# Patient Record
Sex: Female | Born: 1940 | Race: Black or African American | Hispanic: No | Marital: Married | Smoking: Former smoker
Health system: Southern US, Community
[De-identification: ages and names within clinical notes are randomized; demographics above are authoritative.]

## PROBLEM LIST (undated history)

## (undated) DIAGNOSIS — I10 Essential (primary) hypertension: Secondary | ICD-10-CM

## (undated) DIAGNOSIS — E119 Type 2 diabetes mellitus without complications: Secondary | ICD-10-CM

## (undated) HISTORY — PX: CHOLECYSTECTOMY: SHX55

## (undated) HISTORY — PX: ABDOMINAL HYSTERECTOMY: SHX81

---

## 2004-04-04 ENCOUNTER — Other Ambulatory Visit: Payer: Self-pay

## 2004-04-05 ENCOUNTER — Other Ambulatory Visit: Payer: Self-pay

## 2004-11-21 ENCOUNTER — Ambulatory Visit: Payer: Self-pay | Admitting: Internal Medicine

## 2005-11-24 ENCOUNTER — Ambulatory Visit: Payer: Self-pay | Admitting: Internal Medicine

## 2009-01-09 ENCOUNTER — Ambulatory Visit: Payer: Self-pay | Admitting: Family Medicine

## 2009-04-17 ENCOUNTER — Ambulatory Visit: Payer: Self-pay | Admitting: Gastroenterology

## 2009-04-18 ENCOUNTER — Ambulatory Visit: Payer: Self-pay | Admitting: Gastroenterology

## 2010-01-31 ENCOUNTER — Ambulatory Visit: Payer: Self-pay | Admitting: Family Medicine

## 2010-06-06 ENCOUNTER — Emergency Department: Payer: Self-pay | Admitting: Emergency Medicine

## 2013-02-26 ENCOUNTER — Ambulatory Visit: Payer: Self-pay | Admitting: Physical Medicine and Rehabilitation

## 2013-05-24 ENCOUNTER — Ambulatory Visit: Payer: Self-pay | Admitting: Nurse Practitioner

## 2017-05-28 ENCOUNTER — Other Ambulatory Visit: Payer: Self-pay | Admitting: Orthopedic Surgery

## 2017-05-28 DIAGNOSIS — M5136 Other intervertebral disc degeneration, lumbar region: Secondary | ICD-10-CM

## 2017-05-28 DIAGNOSIS — M51369 Other intervertebral disc degeneration, lumbar region without mention of lumbar back pain or lower extremity pain: Secondary | ICD-10-CM

## 2017-05-28 DIAGNOSIS — M79605 Pain in left leg: Principal | ICD-10-CM

## 2017-05-28 DIAGNOSIS — M79604 Pain in right leg: Secondary | ICD-10-CM

## 2017-05-28 DIAGNOSIS — M48062 Spinal stenosis, lumbar region with neurogenic claudication: Secondary | ICD-10-CM

## 2017-06-05 ENCOUNTER — Encounter: Payer: Self-pay | Admitting: Radiology

## 2017-06-05 ENCOUNTER — Ambulatory Visit
Admission: RE | Admit: 2017-06-05 | Discharge: 2017-06-05 | Disposition: A | Discharge status: Home / Self Care | Payer: Medicare Other | Source: Ambulatory Visit | Attending: Orthopedic Surgery | Admitting: Orthopedic Surgery

## 2017-06-05 DIAGNOSIS — M51369 Other intervertebral disc degeneration, lumbar region without mention of lumbar back pain or lower extremity pain: Secondary | ICD-10-CM

## 2017-06-05 DIAGNOSIS — M5127 Other intervertebral disc displacement, lumbosacral region: Secondary | ICD-10-CM | POA: Insufficient documentation

## 2017-06-05 DIAGNOSIS — M48062 Spinal stenosis, lumbar region with neurogenic claudication: Secondary | ICD-10-CM

## 2017-06-05 DIAGNOSIS — M79604 Pain in right leg: Secondary | ICD-10-CM | POA: Diagnosis not present

## 2017-06-05 DIAGNOSIS — M2548 Effusion, other site: Secondary | ICD-10-CM | POA: Diagnosis not present

## 2017-06-05 DIAGNOSIS — M1288 Other specific arthropathies, not elsewhere classified, other specified site: Secondary | ICD-10-CM | POA: Diagnosis not present

## 2017-06-05 DIAGNOSIS — M48061 Spinal stenosis, lumbar region without neurogenic claudication: Secondary | ICD-10-CM | POA: Diagnosis not present

## 2017-06-05 DIAGNOSIS — M79605 Pain in left leg: Secondary | ICD-10-CM | POA: Diagnosis not present

## 2017-06-05 DIAGNOSIS — M4807 Spinal stenosis, lumbosacral region: Secondary | ICD-10-CM | POA: Insufficient documentation

## 2017-06-05 DIAGNOSIS — M5136 Other intervertebral disc degeneration, lumbar region: Secondary | ICD-10-CM | POA: Diagnosis not present

## 2017-07-10 ENCOUNTER — Other Ambulatory Visit: Payer: Self-pay | Admitting: Neurosurgery

## 2017-07-10 DIAGNOSIS — R2689 Other abnormalities of gait and mobility: Secondary | ICD-10-CM

## 2017-07-18 ENCOUNTER — Ambulatory Visit: Payer: Medicare Other

## 2017-07-21 ENCOUNTER — Ambulatory Visit
Admission: RE | Admit: 2017-07-21 | Discharge: 2017-07-21 | Disposition: A | Discharge status: Home / Self Care | Payer: Medicare Other | Source: Ambulatory Visit | Attending: Neurosurgery | Admitting: Neurosurgery

## 2017-07-21 DIAGNOSIS — R2689 Other abnormalities of gait and mobility: Secondary | ICD-10-CM | POA: Diagnosis present

## 2017-07-21 DIAGNOSIS — M4802 Spinal stenosis, cervical region: Secondary | ICD-10-CM | POA: Diagnosis not present

## 2017-07-21 DIAGNOSIS — R292 Abnormal reflex: Secondary | ICD-10-CM | POA: Insufficient documentation

## 2019-02-03 ENCOUNTER — Encounter: Payer: Self-pay | Admitting: Medical Oncology

## 2019-02-03 ENCOUNTER — Inpatient Hospital Stay
Admission: EM | Admit: 2019-02-03 | Discharge: 2019-02-14 | DRG: 180 | Disposition: A | Discharge status: Skilled Nursing Facility | Payer: Medicare Other | Attending: Internal Medicine | Admitting: Internal Medicine

## 2019-02-03 ENCOUNTER — Emergency Department: Payer: Medicare Other

## 2019-02-03 DIAGNOSIS — Z79899 Other long term (current) drug therapy: Secondary | ICD-10-CM

## 2019-02-03 DIAGNOSIS — E860 Dehydration: Secondary | ICD-10-CM | POA: Diagnosis present

## 2019-02-03 DIAGNOSIS — C3490 Malignant neoplasm of unspecified part of unspecified bronchus or lung: Secondary | ICD-10-CM | POA: Diagnosis not present

## 2019-02-03 DIAGNOSIS — E871 Hypo-osmolality and hyponatremia: Secondary | ICD-10-CM | POA: Diagnosis not present

## 2019-02-03 DIAGNOSIS — E876 Hypokalemia: Secondary | ICD-10-CM | POA: Diagnosis not present

## 2019-02-03 DIAGNOSIS — M48061 Spinal stenosis, lumbar region without neurogenic claudication: Secondary | ICD-10-CM | POA: Diagnosis present

## 2019-02-03 DIAGNOSIS — Z515 Encounter for palliative care: Secondary | ICD-10-CM | POA: Diagnosis present

## 2019-02-03 DIAGNOSIS — E559 Vitamin D deficiency, unspecified: Secondary | ICD-10-CM | POA: Diagnosis present

## 2019-02-03 DIAGNOSIS — Z66 Do not resuscitate: Secondary | ICD-10-CM | POA: Diagnosis present

## 2019-02-03 DIAGNOSIS — M5000 Cervical disc disorder with myelopathy, unspecified cervical region: Secondary | ICD-10-CM | POA: Diagnosis present

## 2019-02-03 DIAGNOSIS — I10 Essential (primary) hypertension: Secondary | ICD-10-CM | POA: Diagnosis present

## 2019-02-03 DIAGNOSIS — R0902 Hypoxemia: Secondary | ICD-10-CM

## 2019-02-03 DIAGNOSIS — J9601 Acute respiratory failure with hypoxia: Secondary | ICD-10-CM | POA: Diagnosis present

## 2019-02-03 DIAGNOSIS — R0602 Shortness of breath: Secondary | ICD-10-CM

## 2019-02-03 DIAGNOSIS — Z885 Allergy status to narcotic agent status: Secondary | ICD-10-CM

## 2019-02-03 DIAGNOSIS — Z87891 Personal history of nicotine dependence: Secondary | ICD-10-CM | POA: Diagnosis not present

## 2019-02-03 DIAGNOSIS — G9341 Metabolic encephalopathy: Secondary | ICD-10-CM | POA: Diagnosis present

## 2019-02-03 DIAGNOSIS — K219 Gastro-esophageal reflux disease without esophagitis: Secondary | ICD-10-CM | POA: Diagnosis present

## 2019-02-03 DIAGNOSIS — E785 Hyperlipidemia, unspecified: Secondary | ICD-10-CM | POA: Diagnosis present

## 2019-02-03 DIAGNOSIS — E119 Type 2 diabetes mellitus without complications: Secondary | ICD-10-CM

## 2019-02-03 DIAGNOSIS — J90 Pleural effusion, not elsewhere classified: Secondary | ICD-10-CM

## 2019-02-03 DIAGNOSIS — R531 Weakness: Secondary | ICD-10-CM | POA: Diagnosis not present

## 2019-02-03 DIAGNOSIS — R9431 Abnormal electrocardiogram [ECG] [EKG]: Secondary | ICD-10-CM | POA: Diagnosis present

## 2019-02-03 DIAGNOSIS — J302 Other seasonal allergic rhinitis: Secondary | ICD-10-CM | POA: Diagnosis present

## 2019-02-03 DIAGNOSIS — Z452 Encounter for adjustment and management of vascular access device: Secondary | ICD-10-CM

## 2019-02-03 DIAGNOSIS — E222 Syndrome of inappropriate secretion of antidiuretic hormone: Secondary | ICD-10-CM | POA: Diagnosis present

## 2019-02-03 DIAGNOSIS — Z888 Allergy status to other drugs, medicaments and biological substances status: Secondary | ICD-10-CM

## 2019-02-03 DIAGNOSIS — J910 Malignant pleural effusion: Secondary | ICD-10-CM | POA: Diagnosis present

## 2019-02-03 DIAGNOSIS — Z20828 Contact with and (suspected) exposure to other viral communicable diseases: Secondary | ICD-10-CM | POA: Diagnosis present

## 2019-02-03 DIAGNOSIS — Z9889 Other specified postprocedural states: Secondary | ICD-10-CM

## 2019-02-03 HISTORY — DX: Essential (primary) hypertension: I10

## 2019-02-03 HISTORY — DX: Type 2 diabetes mellitus without complications: E11.9

## 2019-02-03 LAB — CBC WITH DIFFERENTIAL/PLATELET
Abs Immature Granulocytes: 0.06 10*3/uL (ref 0.00–0.07)
Basophils Absolute: 0 10*3/uL (ref 0.0–0.1)
Basophils Relative: 0 %
Eosinophils Absolute: 0 10*3/uL (ref 0.0–0.5)
Eosinophils Relative: 1 %
HCT: 36.6 % (ref 36.0–46.0)
Hemoglobin: 12.2 g/dL (ref 12.0–15.0)
Immature Granulocytes: 1 %
Lymphocytes Relative: 14 %
Lymphs Abs: 0.9 10*3/uL (ref 0.7–4.0)
MCH: 24.8 pg — ABNORMAL LOW (ref 26.0–34.0)
MCHC: 33.3 g/dL (ref 30.0–36.0)
MCV: 74.4 fL — ABNORMAL LOW (ref 80.0–100.0)
Monocytes Absolute: 0.7 10*3/uL (ref 0.1–1.0)
Monocytes Relative: 12 %
Neutro Abs: 4.5 10*3/uL (ref 1.7–7.7)
Neutrophils Relative %: 72 %
Platelets: 274 10*3/uL (ref 150–400)
RBC: 4.92 MIL/uL (ref 3.87–5.11)
RDW: 13.7 % (ref 11.5–15.5)
WBC: 6.2 10*3/uL (ref 4.0–10.5)
nRBC: 0 % (ref 0.0–0.2)

## 2019-02-03 LAB — BASIC METABOLIC PANEL
Anion gap: 12 (ref 5–15)
BUN: 7 mg/dL — ABNORMAL LOW (ref 8–23)
CO2: 27 mmol/L (ref 22–32)
Calcium: 8.9 mg/dL (ref 8.9–10.3)
Chloride: 74 mmol/L — ABNORMAL LOW (ref 98–111)
Creatinine, Ser: 0.43 mg/dL — ABNORMAL LOW (ref 0.44–1.00)
GFR calc Af Amer: >60 mL/min (ref >60)
GFR calc non Af Amer: >60 mL/min (ref >60)
Glucose, Bld: 135 mg/dL — ABNORMAL HIGH (ref 70–99)
Potassium: 3.4 mmol/L — ABNORMAL LOW (ref 3.5–5.1)
Sodium: 113 mmol/L — CL (ref 135–145)

## 2019-02-03 LAB — SARS CORONAVIRUS 2 BY RT PCR (HOSPITAL ORDER, PERFORMED IN ~~LOC~~ HOSPITAL LAB): SARS Coronavirus 2: NEGATIVE

## 2019-02-03 LAB — TROPONIN I (HIGH SENSITIVITY): Troponin I (High Sensitivity): 11 ng/L (ref <18)

## 2019-02-03 MED ORDER — SODIUM CHLORIDE 0.9 % IV BOLUS
1000.0000 mL | Freq: Once | INTRAVENOUS | Status: AC
  Administered 2019-02-03: 1000 mL via INTRAVENOUS

## 2019-02-03 NOTE — ED Notes (Signed)
ED TO INPATIENT HANDOFF REPORT  ED Nurse Name and Phone #: Charvi Gammage   S Name/Age/Gender Cathy Buck 78 y.o. female Room/Bed: ED25A/ED25A  Code Status   Code Status: Not on file  Home/SNF/Other Home Patient oriented to: self, place, time and situation Is this baseline? Yes   Triage Complete: Triage complete  Chief Complaint sob  Triage Note Pt from home via ems with reports that she seen her PCP yesterday for sob x 8 days, was told she had fluid on her lungs and placed on fluid pill. Today pt reports having gen. Weakness and sob. Pt denies pain.    Allergies Allergies  Allergen Reactions  . Codeine Hives  . Atorvastatin Other (See Comments)  . Citalopram Other (See Comments)    Nausea  . Keflex [Cephalexin] Diarrhea  . Tylenol [Acetaminophen] Hives    Level of Care/Admitting Diagnosis ED Disposition    ED Disposition Condition [facility] Area: [place] [[id number]]  Level of Care: Med-Surg [16]  Covid Evaluation: Confirmed COVID Negative  Diagnosis: Hyponatremia [[id number]]  Admitting Physician: Lance Coon [[id number]]  Attending Physician: Lance Coon [[id number]]  Estimated length of stay: past midnight tomorrow  Certification:: I certify this patient will need inpatient services for at least 2 midnights  PT Class (Do Not Modify): Inpatient [101]  PT Acc Code (Do Not Modify): Private [1]       B Medical/Surgery History Past Medical History:  Diagnosis Date  . Diabetes mellitus without complication ([place])   . Hypertension    Past Surgical History:  Procedure Laterality Date  . ABDOMINAL HYSTERECTOMY    . CHOLECYSTECTOMY       A IV Location/Drains/Wounds Patient Lines/Drains/Airways Status   Active Line/Drains/Airways    Name:   Placement date:   Placement time:   Site:   Days:   Peripheral IV 02/03/19 Left Antecubital   02/03/19    1724    Antecubital   less than 1          Intake/Output Last 24 hours No intake  or output data in the 24 hours ending 02/03/19 2335  Labs/Imaging Results for orders placed or performed during the hospital encounter of 02/03/19 (from the past 48 hour(s))  CBC with Differential     Status: Abnormal   Collection Time: 02/03/19  6:04 PM  Result Value Ref Range   WBC 6.2 4.0 - 10.5 K/uL   RBC 4.92 3.87 - 5.11 MIL/uL   Hemoglobin 12.2 12.0 - 15.0 g/dL   HCT 36.6 36.0 - 46.0 %   MCV 74.4 (L) 80.0 - 100.0 fL   MCH 24.8 (L) 26.0 - 34.0 pg   MCHC 33.3 30.0 - 36.0 g/dL   RDW 13.7 11.5 - 15.5 %   Platelets 274 150 - 400 K/uL   nRBC 0.0 0.0 - 0.2 %   Neutrophils Relative % 72 %   Neutro Abs 4.5 1.7 - 7.7 K/uL   Lymphocytes Relative 14 %   Lymphs Abs 0.9 0.7 - 4.0 K/uL   Monocytes Relative 12 %   Monocytes Absolute 0.7 0.1 - 1.0 K/uL   Eosinophils Relative 1 %   Eosinophils Absolute 0.0 0.0 - 0.5 K/uL   Basophils Relative 0 %   Basophils Absolute 0.0 0.0 - 0.1 K/uL   Immature Granulocytes 1 %   Abs Immature Granulocytes 0.06 0.00 - 0.07 K/uL    Comment: Performed at [facility], [address], [place], [place] [address]  Basic  metabolic panel     Status: Abnormal   Collection Time: 02/03/19  6:04 PM  Result Value Ref Range   Sodium 113 (LL) 135 - 145 mmol/L    Comment: CRITICAL RESULT CALLED TO, READ BACK BY AND VERIFIED WITH LORRIE LEMONS @1851  02/03/19 MJU    Potassium 3.4 (L) 3.5 - 5.1 mmol/L   Chloride 74 (L) 98 - 111 mmol/L   CO2 27 22 - 32 mmol/L   Glucose, Bld 135 (H) 70 - 99 mg/dL   BUN 7 (L) 8 - 23 mg/dL   Creatinine, Ser 0.43 (L) 0.44 - 1.00 mg/dL   Calcium 8.9 8.9 - 10.3 mg/dL   GFR calc non Af Amer >60 >60 mL/min   GFR calc Af Amer >60 >60 mL/min   Anion gap 12 5 - 15    Comment: Performed at [facility], [place], [place] [address]  Troponin I (High Sensitivity)     Status: None   Collection Time: 02/03/19  6:04 PM  Result Value Ref Range   Troponin I (High Sensitivity) 11 <18 ng/L    Comment:  (NOTE) Elevated high sensitivity troponin I (hsTnI) values and significant  changes across serial measurements may suggest ACS but many other  chronic and acute conditions are known to elevate hsTnI results.  Refer to the "Links" section for chest pain algorithms and additional  guidance. Performed at [facility], [address], [place], [place] [address]   SARS Coronavirus 2 (CEPHEID- Performed in [facility] hospital lab), Hosp Order     Status: None   Collection Time: 02/03/19  6:55 PM   Specimen: Nasopharyngeal Swab  Result Value Ref Range   SARS Coronavirus 2 NEGATIVE NEGATIVE    Comment: (NOTE) If result is NEGATIVE SARS-CoV-2 target nucleic acids are NOT DETECTED. The SARS-CoV-2 RNA is generally detectable in upper and lower  respiratory specimens during the acute phase of infection. The lowest  concentration of SARS-CoV-2 viral copies this assay can detect is 250  copies / mL. A negative result does not preclude SARS-CoV-2 infection  and should not be used as the sole basis for treatment or other  patient management decisions.  A negative result may occur with  improper specimen collection / handling, submission of specimen other  than nasopharyngeal swab, presence of viral mutation(s) within the  areas targeted by this assay, and inadequate number of viral copies  (<250 copies / mL). A negative result must be combined with clinical  observations, patient history, and epidemiological information. If result is POSITIVE SARS-CoV-2 target nucleic acids are DETECTED. The SARS-CoV-2 RNA is generally detectable in upper and lower  respiratory specimens dur ing the acute phase of infection.  Positive  results are indicative of active infection with SARS-CoV-2.  Clinical  correlation with patient history and other diagnostic information is  necessary to determine patient infection status.  Positive results do  not rule out bacterial infection or co-infection with other  viruses. If result is PRESUMPTIVE POSTIVE SARS-CoV-2 nucleic acids MAY BE PRESENT.   A presumptive positive result was obtained on the submitted specimen  and confirmed on repeat testing.  While 2019 novel coronavirus  (SARS-CoV-2) nucleic acids may be present in the submitted sample  additional confirmatory testing may be necessary for epidemiological  and / or clinical management purposes  to differentiate between  SARS-CoV-2 and other Sarbecovirus currently known to infect humans.  If clinically indicated additional testing with an alternate test  methodology [phone]) is  advised. The SARS-CoV-2 RNA is generally  detectable in upper and lower respiratory sp ecimens during the acute  phase of infection. The expected result is Negative. Fact Sheet for Patients:  StrictlyIdeas.no Fact Sheet for Healthcare Providers: BankingDealers.co.za This test is not yet approved or cleared by the [place] FDA and has been authorized for detection and/or diagnosis of SARS-CoV-2 by FDA under an Emergency Use Authorization (EUA).  This EUA will remain in effect (meaning this test can be used) for the duration of the COVID-19 declaration under Section 564(b)(1) of the Act, 21 U.S.C. section 360bbb-3(b)(1), unless the authorization is terminated or revoked sooner. Performed at [facility], [address] [place], [place] [address]    Dg Chest Portable 1 View  Result Date: 02/03/2019 CLINICAL DATA:  Shortness of breath. EXAM: PORTABLE CHEST 1 VIEW COMPARISON:  None. FINDINGS: The heart size and pulmonary vascularity are normal. Aortic atherosclerosis. Small right pleural effusion. Focal linear atelectasis in the right midzone. No acute bone abnormality. IMPRESSION: 1. Small right pleural effusion. Slight atelectasis in the right midzone. 2. Aortic atherosclerosis. Electronically Signed   By: Lorriane Shire M.D.   On: 02/03/2019 18:36     Pending Labs Unresulted Labs (From admission, onward)    Start     Ordered   02/03/19 2159  Urinalysis, Complete w Microscopic  Once,   STAT     02/03/19 2158   02/03/19 2159  Sodium, urine, random  Once,   STAT     02/03/19 2158   02/03/19 2159  Osmolality, urine  Once,   STAT     02/03/19 2158   02/03/19 2159  Osmolality  Add-on,   AD     02/03/19 2158   Signed and Held  CBC  (enoxaparin (LOVENOX)    CrCl >/= 30 ml/min)  Once,   R    Comments: Baseline for enoxaparin therapy IF NOT ALREADY DRAWN.  Notify MD if PLT < 100 K.    Signed and Held   Signed and Held  Creatinine, serum  (enoxaparin (LOVENOX)    CrCl >/= 30 ml/min)  Once,   R    Comments: Baseline for enoxaparin therapy IF NOT ALREADY DRAWN.    Signed and Held   Signed and Held  Creatinine, serum  (enoxaparin (LOVENOX)    CrCl >/= 30 ml/min)  Weekly,   R    Comments: while on enoxaparin therapy    Signed and Held   Signed and Held  Basic metabolic panel  Tomorrow morning,   R     Signed and Held   Signed and Held  CBC  Tomorrow morning,   R     Signed and Held   Signed and Held  Sodium  Once,   R     Signed and Held          Vitals/Pain Today's Vitals   02/03/19 1930 02/03/19 2000 02/03/19 2130 02/03/19 2200  BP: (!) 159/81 (!) 157/88 (!) 149/79 (!) 161/80  Pulse: (!) 57 (!) 56 (!) 54 (!) 54  Resp: 17 17 15 17   Temp:      TempSrc:      SpO2: 100% 96% 94% 96%  Weight:      Height:      PainSc:        Isolation Precautions No active isolations  Medications Medications  sodium chloride 0.9 % bolus 1,000 mL (0 mLs Intravenous Stopped 02/03/19 2154)    Mobility walks Moderate fall risk  Focused Assessments Cardiac Assessment Handoff:  Cardiac Rhythm: Sinus bradycardia No results found for: CKTOTAL, CKMB, CKMBINDEX, TROPONINI No results found for: DDIMER Does the Patient currently have chest pain? No      R Recommendations: See Admitting Provider Note  Report given to:   Additional  Notes: none

## 2019-02-03 NOTE — H&P (Signed)
[place] [facility] [place] NAME: Cathy Buck    MR#:  [id number]  DATE OF BIRTH:  Oct 30, 1940  DATE OF ADMISSION:  02/03/2019  PRIMARY CARE PHYSICIAN: Dayton Martes Victoriano Lain, NP   REQUESTING/REFERRING PHYSICIAN: Archie Balboa, MD  CHIEF COMPLAINT:   Chief Complaint  Patient presents with  . Shortness of Breath  . Weakness    HISTORY OF PRESENT ILLNESS:  Cathy Buck  is a 78 y.o. female who presents with chief complaint as above.  Patient presents the ED with a complaint of weakness.  She states that she has felt generally weak for couple of days.  Evaluation in the ED showed profound hyponatremia, with a sodium of 113.  Patient is on Lasix and HCTZ.  She states she has been trying to keep up with fluid intake, mostly water.  Hospitalist were called for admission  PAST MEDICAL HISTORY:   Past Medical History:  Diagnosis Date  . Diabetes mellitus without complication ([place])   . Hypertension      PAST SURGICAL HISTORY:   Past Surgical History:  Procedure Laterality Date  . ABDOMINAL HYSTERECTOMY    . CHOLECYSTECTOMY       SOCIAL HISTORY:   Social History   Tobacco Use  . Smoking status: Former Smoker  Substance Use Topics  . Alcohol use: Not Currently     FAMILY HISTORY:    Family history reviewed and is non-contributory DRUG ALLERGIES:   Allergies  Allergen Reactions  . Codeine Hives  . Atorvastatin Other (See Comments)  . Citalopram Other (See Comments)    Nausea  . Keflex [Cephalexin] Diarrhea  . Tylenol [Acetaminophen] Hives    MEDICATIONS AT HOME:   Prior to Admission medications   Medication Sig Start Date End Date Taking? Authorizing Provider  aspirin EC 81 MG tablet Take 81 mg by mouth daily.   Yes [provider]  atenolol (TENORMIN) 50 MG tablet Take 50 mg by mouth daily. 11/22/18  Yes [provider]  enalapril (VASOTEC) 20 MG tablet Take 20 mg by mouth 2 (two) times a day. 11/22/18   Yes [provider]  furosemide (LASIX) 40 MG tablet Take 40 mg by mouth every morning. 02/02/19 02/02/20 Yes [provider]  hydrochlorothiazide (HYDRODIURIL) 25 MG tablet Take 25 mg by mouth daily. 11/22/18  Yes [provider]  lisinopril (ZESTRIL) 10 MG tablet Take 10 mg by mouth daily.   Yes [provider]  metFORMIN (GLUCOPHAGE) 1000 MG tablet Take 500 mg by mouth daily. 11/22/18  Yes [provider]  omeprazole (PRILOSEC) 40 MG capsule Take 40 mg by mouth daily. 30 minutes before food and meds 01/18/19 01/18/20 Yes [provider]  simvastatin (ZOCOR) 20 MG tablet Take 20 mg by mouth daily. 11/22/18  Yes [provider]  Calcium Carb-Cholecalciferol (CALCIUM-VITAMIN D) 500-200 MG-UNIT tablet Take 1 tablet by mouth 2 (two) times a day.    [provider]    REVIEW OF SYSTEMS:  Review of Systems  Constitutional: Negative for chills, fever, malaise/fatigue and weight loss.  HENT: Negative for ear pain, hearing loss and tinnitus.   Eyes: Negative for blurred vision, double vision, pain and redness.  Respiratory: Negative for cough, hemoptysis and shortness of breath.   Cardiovascular: Negative for chest pain, palpitations, orthopnea and leg swelling.  Gastrointestinal: Negative for abdominal pain, constipation, diarrhea, nausea and vomiting.  Genitourinary: Negative for dysuria, frequency and hematuria.  Musculoskeletal: Negative for back pain, joint pain and  neck pain.  Skin:       No acne, rash, or lesions  Neurological: Positive for weakness. Negative for dizziness, tremors and focal weakness.  Endo/Heme/Allergies: Negative for polydipsia. Does not bruise/bleed easily.  Psychiatric/Behavioral: Negative for depression. The patient is not nervous/anxious and does not have insomnia.      VITAL SIGNS:   Vitals:   02/03/19 1930 02/03/19 2000 02/03/19 2130 02/03/19 2200  BP: (!) 159/81 (!) 157/88 (!) 149/79 (!) 161/80   Pulse: (!) 57 (!) 56 (!) 54 (!) 54  Resp: 17 17 15 17   Temp:      TempSrc:      SpO2: 100% 96% 94% 96%  Weight:      Height:       Wt Readings from Last 3 Encounters:  02/03/19 96.6 kg    PHYSICAL EXAMINATION:  Physical Exam  Vitals reviewed. Constitutional: She is oriented to person, place, and time. She appears well-developed and well-nourished. No distress.  HENT:  Head: Normocephalic and atraumatic.  Mouth/Throat: Oropharynx is clear and moist.  Eyes: Pupils are equal, round, and reactive to light. Conjunctivae and EOM are normal. No scleral icterus.  Neck: Normal range of motion. Neck supple. No JVD present. No thyromegaly present.  Cardiovascular: Normal rate, regular rhythm and intact distal pulses. Exam reveals no gallop and no friction rub.  No murmur heard. Respiratory: Effort normal and breath sounds normal. No respiratory distress. She has no wheezes. She has no rales.  GI: Soft. Bowel sounds are normal. She exhibits no distension. There is no abdominal tenderness.  Musculoskeletal: Normal range of motion.        General: No edema.     Comments: No arthritis, no gout  Lymphadenopathy:    She has no cervical adenopathy.  Neurological: She is alert and oriented to person, place, and time. No cranial nerve deficit.  No dysarthria, no aphasia  Skin: Skin is warm and dry. No rash noted. No erythema.  Psychiatric: She has a normal mood and affect. Her behavior is normal. Judgment and thought content normal.    LABORATORY PANEL:   CBC Recent Labs  Lab 02/03/19 1804  WBC 6.2  HGB 12.2  HCT 36.6  PLT 274   ------------------------------------------------------------------------------------------------------------------  Chemistries  Recent Labs  Lab 02/03/19 1804  NA 113*  K 3.4*  CL 74*  CO2 27  GLUCOSE 135*  BUN 7*  CREATININE 0.43*  CALCIUM 8.9    ------------------------------------------------------------------------------------------------------------------  Cardiac Enzymes No results for input(s): TROPONINI in the last 168 hours. ------------------------------------------------------------------------------------------------------------------  RADIOLOGY:  Dg Chest Portable 1 View  Result Date: 02/03/2019 CLINICAL DATA:  Shortness of breath. EXAM: PORTABLE CHEST 1 VIEW COMPARISON:  None. FINDINGS: The heart size and pulmonary vascularity are normal. Aortic atherosclerosis. Small right pleural effusion. Focal linear atelectasis in the right midzone. No acute bone abnormality. IMPRESSION: 1. Small right pleural effusion. Slight atelectasis in the right midzone. 2. Aortic atherosclerosis. Electronically Signed   By: Lorriane Shire M.D.   On: 02/03/2019 18:36    EKG:   Orders placed or performed during the hospital encounter of 02/03/19  . EKG 12-Lead  . EKG 12-Lead  . ED EKG  . ED EKG    IMPRESSION AND PLAN:  Principal Problem:   Hyponatremia -unclear over what timeframe the patient's sodium may have dropped.  We will need to bring it up slowly.  Urine studies ordered to help in evaluation.  We will start with gentle IV normal saline, with serial sodium checks.  Hold Lasix and hydrochlorothiazide. Active Problems:   HTN (hypertension) -continue home meds except for Lasix and HCTZ   Diabetes (HCC) -sliding scale insulin coverage  Chart review performed and case discussed with ED provider. Labs, imaging and/or ECG reviewed by provider and discussed with patient/family. Management plans discussed with the patient and/or family.  COVID-19 status: Tested negative     DVT PROPHYLAXIS: SubQ lovenox   GI PROPHYLAXIS:  None  ADMISSION STATUS: Inpatient     CODE STATUS: Full  TOTAL TIME TAKING CARE OF THIS PATIENT: 45 minutes.   This patient was evaluated in the context of the global COVID-19 pandemic, which necessitated  consideration that the patient might be at risk for infection with the SARS-CoV-2 virus that causes COVID-19. Institutional protocols and algorithms that pertain to the evaluation of patients at risk for COVID-19 are in a state of rapid change based on information released by regulatory bodies including the CDC and federal and state organizations. These policies and algorithms were followed to the best of this provider's knowledge to date during the patient's care at this facility.  Ethlyn Daniels 02/03/2019, 11:14 PM  [facility]  Office  [phone]  CC: Primary care physician; Sallee Lange, NP  Note:  This document was prepared using Dragon voice recognition software and may include unintentional dictation errors.

## 2019-02-03 NOTE — ED Notes (Signed)
Resumed care from [place] rn.  Pt alert. Sinus brady on monitor.  Iv fluids infusing.  Pt waiting on admission.

## 2019-02-03 NOTE — ED Provider Notes (Signed)
[facility] Emergency Department Provider Note   ____________________________________________   I have reviewed the triage vital signs and the nursing notes.   HISTORY  Chief Complaint Shortness of Breath and Weakness   History limited by: Not Limited   HPI Cathy Buck is a 78 y.o. female who presents to the emergency department today with complaints of shortness of breath and weakness.  She states that her shortness of breath is been present for the past 8 days.  She had been taking some fluid pills since then because of concerns for fluid overload.  She states that she has not had a whole lot of output and has not noticed any change in her shortness of breath.  She also describes generalized weakness.  The patient has not had any associated chest pain.  No cough.  No fevers.  Denies any known sick contacts.  Records reviewed. Per medical record review patient has a history of DM, HTN  Past Medical History:  Diagnosis Date  . Diabetes mellitus without complication ([place])   . Hypertension     There are no active problems to display for this patient.   History reviewed. No pertinent surgical history.  Prior to Admission medications   Not on File    Allergies Keflex [cephalexin] and Tylenol [acetaminophen]  No family history on file.  Social History Social History   Tobacco Use  . Smoking status: Not on file  Substance Use Topics  . Alcohol use: Not on file  . Drug use: Not on file    Review of Systems Constitutional: No fever/chills. Positive for generalized weakness. Eyes: No visual changes. ENT: No sore throat. Cardiovascular: Denies chest pain. Respiratory: Positive shortness of breath. Gastrointestinal: No abdominal pain.  No nausea, no vomiting.  No diarrhea.   Genitourinary: Negative for dysuria. Musculoskeletal: Negative for back pain. Skin: Negative for rash. Neurological: Negative for headaches, focal weakness or  numbness.  ____________________________________________   PHYSICAL EXAM:  VITAL SIGNS: ED Triage Vitals  Enc Vitals Group     BP 02/03/19 1732 (!) 166/77     Pulse Rate 02/03/19 1727 61     Resp 02/03/19 1727 20     Temp 02/03/19 1727 98.3 F (36.8 C)     Temp Source 02/03/19 1727 Oral     SpO2 02/03/19 1727 96 %     Weight 02/03/19 1729 213 lb (96.6 kg)     Height 02/03/19 1729 5\' 4"  (1.626 m)     Head Circumference --      Peak Flow --      Pain Score 02/03/19 1729 0   Constitutional: Alert and oriented.  Eyes: Conjunctivae are normal.  ENT      Head: Normocephalic and atraumatic.      Nose: No congestion/rhinnorhea.      Mouth/Throat: Mucous membranes are moist.      Neck: No stridor. Hematological/Lymphatic/Immunilogical: No cervical lymphadenopathy. Cardiovascular: Normal rate, regular rhythm.  No murmurs, rubs, or gallops.  Respiratory: Normal respiratory effort without tachypnea nor retractions. Breath sounds are clear and equal bilaterally. No wheezes/rales/rhonchi. Gastrointestinal: Soft and non tender. No rebound. No guarding.  Genitourinary: Deferred Musculoskeletal: Normal range of motion in all extremities. No lower extremity edema. Neurologic:  Normal speech and language. No gross focal neurologic deficits are appreciated.  Skin:  Skin is warm, dry and intact. No rash noted. Psychiatric: Mood and affect are normal. Speech and behavior are normal. Patient exhibits appropriate insight and judgment.  ____________________________________________  LABS (pertinent positives/negatives)  BMP na 113, k 3.4, cl 74, cr 0.43 Trop hs 11 CBC wbc 6.2, hgb 12.2, plt 274  ____________________________________________   EKG  I, Nance Pear, attending physician, personally viewed and interpreted this EKG  EKG Time: 1734 Rate: 56 Rhythm: sinus bradycardia Axis: normal Intervals: qtc 442 QRS: narrow ST changes: no st elevation Impression: abnormal  ekg   ____________________________________________    RADIOLOGY  CXR Small right pleural effusion  ____________________________________________   PROCEDURES  Procedures  CRITICAL CARE Performed by: Nance Pear   Total critical care time: 30 minutes  Critical care time was exclusive of separately billable procedures and treating other patients.  Critical care was necessary to treat or prevent imminent or life-threatening deterioration.  Critical care was time spent personally by me on the following activities: development of treatment plan with patient and/or surrogate as well as nursing, discussions with consultants, evaluation of patient's response to treatment, examination of patient, obtaining history from patient or surrogate, ordering and performing treatments and interventions, ordering and review of laboratory studies, ordering and review of radiographic studies, pulse oximetry and re-evaluation of patient's condition.  ____________________________________________   INITIAL IMPRESSION / ASSESSMENT AND PLAN / ED COURSE  Pertinent labs & imaging results that were available during my care of the patient were reviewed by me and considered in my medical decision making (see chart for details).   Patient presented to the emergency department today with concerns for some shortness of breath and weakness is been ongoing for the past week or so.  Patient's chest x-ray without any concerning findings.  Patient states she has been diuresing at home and her sodium today was 113.  This certainly could explain a lot of the patient's weakness.  Patient will be started on IV fluids here.  Will plan on admission.  Discussed findings with patient. Discussed case with hospitalist.  ____________________________________________   FINAL CLINICAL IMPRESSION(S) / ED DIAGNOSES  Final diagnoses:  Weakness  Hyponatremia     Note: This dictation was prepared with Dragon dictation.  Any transcriptional errors that result from this process are unintentional     Nance Pear, MD 02/03/19 2057

## 2019-02-03 NOTE — ED Notes (Signed)
primedoc in with pt for admission.  

## 2019-02-03 NOTE — ED Triage Notes (Signed)
Pt from home via ems with reports that she seen her PCP yesterday for sob x 8 days, was told she had fluid on her lungs and placed on fluid pill. Today pt reports having gen. Weakness and sob. Pt denies pain.

## 2019-02-03 NOTE — ED Notes (Signed)
Pt sleeping  Pt waiting on admission.

## 2019-02-04 ENCOUNTER — Other Ambulatory Visit: Payer: Self-pay

## 2019-02-04 ENCOUNTER — Inpatient Hospital Stay: Payer: Medicare Other

## 2019-02-04 LAB — BASIC METABOLIC PANEL
Anion gap: 9 (ref 5–15)
BUN: 7 mg/dL — ABNORMAL LOW (ref 8–23)
CO2: 30 mmol/L (ref 22–32)
Calcium: 8.7 mg/dL — ABNORMAL LOW (ref 8.9–10.3)
Chloride: 79 mmol/L — ABNORMAL LOW (ref 98–111)
Creatinine, Ser: 0.48 mg/dL (ref 0.44–1.00)
GFR calc Af Amer: >60 mL/min (ref >60)
GFR calc non Af Amer: >60 mL/min (ref >60)
Glucose, Bld: 126 mg/dL — ABNORMAL HIGH (ref 70–99)
Potassium: 3.5 mmol/L (ref 3.5–5.1)
Sodium: 118 mmol/L — CL (ref 135–145)

## 2019-02-04 LAB — SODIUM
Sodium: 118 mmol/L — CL (ref 135–145)
Sodium: 117 mmol/L — CL (ref 135–145)
Sodium: 117 mmol/L — CL (ref 135–145)
Sodium: 118 mmol/L — CL (ref 135–145)
Sodium: 118 mmol/L — CL (ref 135–145)

## 2019-02-04 LAB — URINALYSIS, COMPLETE (UACMP) WITH MICROSCOPIC
Bacteria, UA: NONE SEEN
Bilirubin Urine: NEGATIVE
Glucose, UA: NEGATIVE mg/dL
Hgb urine dipstick: NEGATIVE
Ketones, ur: 20 mg/dL — AB
Leukocytes,Ua: NEGATIVE
Nitrite: NEGATIVE
Protein, ur: 100 mg/dL — AB
Specific Gravity, Urine: 1.014 (ref 1.005–1.030)
pH: 6 (ref 5.0–8.0)

## 2019-02-04 LAB — CBC
HCT: 36.1 % (ref 36.0–46.0)
Hemoglobin: 11.7 g/dL — ABNORMAL LOW (ref 12.0–15.0)
MCH: 24.5 pg — ABNORMAL LOW (ref 26.0–34.0)
MCHC: 32.4 g/dL (ref 30.0–36.0)
MCV: 75.5 fL — ABNORMAL LOW (ref 80.0–100.0)
Platelets: 236 K/uL (ref 150–400)
RBC: 4.78 MIL/uL (ref 3.87–5.11)
RDW: 13.4 % (ref 11.5–15.5)
WBC: 6.1 K/uL (ref 4.0–10.5)
nRBC: 0 % (ref 0.0–0.2)

## 2019-02-04 LAB — OSMOLALITY: Osmolality: 244 mOsm/kg — CL (ref 275–295)

## 2019-02-04 LAB — CORTISOL: Cortisol, Plasma: 14.5 ug/dL

## 2019-02-04 LAB — TROPONIN I (HIGH SENSITIVITY): Troponin I (High Sensitivity): 15 ng/L (ref <18)

## 2019-02-04 LAB — GLUCOSE, CAPILLARY
Glucose-Capillary: 110 mg/dL — ABNORMAL HIGH (ref 70–99)
Glucose-Capillary: 112 mg/dL — ABNORMAL HIGH (ref 70–99)
Glucose-Capillary: 112 mg/dL — ABNORMAL HIGH (ref 70–99)
Glucose-Capillary: 121 mg/dL — ABNORMAL HIGH (ref 70–99)
Glucose-Capillary: 134 mg/dL — ABNORMAL HIGH (ref 70–99)
Glucose-Capillary: 90 mg/dL (ref 70–99)

## 2019-02-04 LAB — SODIUM, URINE, RANDOM: Sodium, Ur: 30 mmol/L

## 2019-02-04 LAB — OSMOLALITY, URINE: Osmolality, Ur: 383 mOsm/kg (ref 300–900)

## 2019-02-04 LAB — TSH: TSH: 0.854 u[IU]/mL (ref 0.350–4.500)

## 2019-02-04 MED ORDER — INSULIN ASPART 100 UNIT/ML ~~LOC~~ SOLN
0.0000 [IU] | Freq: Three times a day (TID) | SUBCUTANEOUS | Status: DC
  Administered 2019-02-04 (×2): 1 [IU] via SUBCUTANEOUS
  Administered 2019-02-10 (×2): 2 [IU] via SUBCUTANEOUS
  Administered 2019-02-11: 1 [IU] via SUBCUTANEOUS
  Administered 2019-02-12: 13:00:00 2 [IU] via SUBCUTANEOUS
  Administered 2019-02-13 – 2019-02-14 (×2): 1 [IU] via SUBCUTANEOUS
  Filled 2019-02-04 (×7): qty 1

## 2019-02-04 MED ORDER — ONDANSETRON HCL 4 MG PO TABS: 4.0000 mg | ORAL_TABLET | Freq: Four times a day (QID) | ORAL | Status: DC | PRN

## 2019-02-04 MED ORDER — PANTOPRAZOLE SODIUM 40 MG PO TBEC
40.0000 mg | DELAYED_RELEASE_TABLET | Freq: Every day | ORAL | Status: DC
  Administered 2019-02-04 – 2019-02-14 (×10): 40 mg via ORAL
  Filled 2019-02-04 (×10): qty 1

## 2019-02-04 MED ORDER — INSULIN ASPART 100 UNIT/ML ~~LOC~~ SOLN: 0.0000 [IU] | Freq: Every day | SUBCUTANEOUS | Status: DC

## 2019-02-04 MED ORDER — ATENOLOL 50 MG PO TABS
50.0000 mg | ORAL_TABLET | Freq: Every day | ORAL | Status: DC
  Administered 2019-02-04 – 2019-02-14 (×10): 50 mg via ORAL
  Filled 2019-02-04 (×2): qty 2
  Filled 2019-02-04 (×5): qty 1
  Filled 2019-02-04: qty 2
  Filled 2019-02-04 (×2): qty 1

## 2019-02-04 MED ORDER — ENOXAPARIN SODIUM 40 MG/0.4ML ~~LOC~~ SOLN
40.0000 mg | SUBCUTANEOUS | Status: DC
  Administered 2019-02-04: 10:00:00 40 mg via SUBCUTANEOUS
  Filled 2019-02-04: qty 0.4

## 2019-02-04 MED ORDER — ASPIRIN EC 81 MG PO TBEC
81.0000 mg | DELAYED_RELEASE_TABLET | Freq: Every day | ORAL | Status: DC
  Administered 2019-02-04: 81 mg via ORAL
  Filled 2019-02-04: qty 1

## 2019-02-04 MED ORDER — ONDANSETRON HCL 4 MG/2ML IJ SOLN: 4.0000 mg | Freq: Four times a day (QID) | INTRAMUSCULAR | Status: DC | PRN

## 2019-02-04 MED ORDER — TRAZODONE HCL 100 MG PO TABS
50.0000 mg | ORAL_TABLET | Freq: Every evening | ORAL | Status: DC | PRN
  Administered 2019-02-04 – 2019-02-13 (×5): 50 mg via ORAL
  Filled 2019-02-04 (×6): qty 1

## 2019-02-04 MED ORDER — ENALAPRIL MALEATE 20 MG PO TABS
20.0000 mg | ORAL_TABLET | Freq: Two times a day (BID) | ORAL | Status: DC
  Administered 2019-02-04 – 2019-02-14 (×18): 20 mg via ORAL
  Filled 2019-02-04 (×22): qty 1

## 2019-02-04 MED ORDER — SODIUM CHLORIDE 0.9 % IV SOLN
INTRAVENOUS | Status: DC
  Administered 2019-02-04: 01:00:00 via INTRAVENOUS

## 2019-02-04 MED ORDER — SODIUM CHLORIDE 0.9 % IV SOLN
INTRAVENOUS | Status: DC
  Administered 2019-02-04: 17:00:00 via INTRAVENOUS

## 2019-02-04 MED ORDER — HYDRALAZINE HCL 20 MG/ML IJ SOLN
10.0000 mg | INTRAMUSCULAR | Status: DC | PRN
  Administered 2019-02-05: 10 mg via INTRAVENOUS
  Filled 2019-02-04: qty 1

## 2019-02-04 NOTE — ED Notes (Signed)
Report called to sarah rn floor nurse

## 2019-02-04 NOTE — Progress Notes (Signed)
CRITICAL VALUE STICKER  CRITICAL VALUE: Sodium 118  RECEIVER (on-site recipient of call): Dorna Bloom RN  DATE & TIME NOTIFIED: 02/04/19, [place] (representative from lab):  MD NOTIFIED: NA, improvement  TIME OF NOTIFICATION:  RESPONSE:

## 2019-02-04 NOTE — Progress Notes (Addendum)
[place] at [place] NAME: Cathy Buck    MR#:  [id number]  DATE OF BIRTH:  Nov 20, 1940  SUBJECTIVE:  CHIEF COMPLAINT:   Chief Complaint  Patient presents with  . Shortness of Breath  . Weakness   No new complaint this morning.  Patient still complains of having some shortness of breath which was present on admission.  Initial chest x-ray was negative.  CT chest requested today.  REVIEW OF SYSTEMS:  Review of Systems  Constitutional: Negative for chills and fever.  HENT: Negative for hearing loss and tinnitus.   Eyes: Negative for blurred vision and double vision.  Respiratory: Positive for shortness of breath. Negative for cough.   Cardiovascular: Negative for chest pain and palpitations.  Gastrointestinal: Negative for heartburn and nausea.  Genitourinary: Negative for dysuria and urgency.  Musculoskeletal: Negative for myalgias and neck pain.  Skin: Negative for itching and rash.  Neurological: Negative for dizziness and headaches.  Psychiatric/Behavioral: Negative for depression and hallucinations.    DRUG ALLERGIES:   Allergies  Allergen Reactions  . Codeine Hives  . Atorvastatin Other (See Comments)  . Citalopram Other (See Comments)    Nausea  . Keflex [Cephalexin] Diarrhea  . Tylenol [Acetaminophen] Hives   VITALS:  Blood pressure (!) 159/75, pulse (!) 56, temperature 97.9 F (36.6 C), resp. rate 19, height 5\' 4"  (1.626 m), weight 96.6 kg, SpO2 100 %. PHYSICAL EXAMINATION:   Physical Exam  Constitutional: She is oriented to person, place, and time. She appears well-developed and well-nourished.  HENT:  Head: Normocephalic and atraumatic.  Right Ear: External ear normal.  Eyes: Pupils are equal, round, and reactive to light. Conjunctivae are normal. Right eye exhibits no discharge.  Neck: Normal range of motion. Neck supple. No thyromegaly present.  Cardiovascular: Normal rate, regular rhythm and normal heart  sounds.  Respiratory: Effort normal and breath sounds normal. No respiratory distress.  GI: Soft. Bowel sounds are normal. She exhibits no distension.  Musculoskeletal: Normal range of motion.        General: No edema.  Neurological: She is alert and oriented to person, place, and time. No cranial nerve deficit.  Skin: Skin is warm. She is not diaphoretic. No erythema.  Psychiatric: She has a normal mood and affect. Her behavior is normal.   LABORATORY PANEL:  Female CBC Recent Labs  Lab 02/04/19 0255  WBC 6.1  HGB 11.7*  HCT 36.1  PLT 236   ------------------------------------------------------------------------------------------------------------------ Chemistries  Recent Labs  Lab 02/04/19 0255  02/04/19 1229  NA 118*   < > 117*  K 3.5  --   --   CL 79*  --   --   CO2 30  --   --   GLUCOSE 126*  --   --   BUN 7*  --   --   CREATININE 0.48  --   --   CALCIUM 8.7*  --   --    < > = values in this interval not displayed.   RADIOLOGY:  Dg Chest Portable 1 View  Result Date: 02/03/2019 CLINICAL DATA:  Shortness of breath. EXAM: PORTABLE CHEST 1 VIEW COMPARISON:  None. FINDINGS: The heart size and pulmonary vascularity are normal. Aortic atherosclerosis. Small right pleural effusion. Focal linear atelectasis in the right midzone. No acute bone abnormality. IMPRESSION: 1. Small right pleural effusion. Slight atelectasis in the right midzone. 2. Aortic atherosclerosis. Electronically Signed   By: Lorriane Shire M.D.   On:  02/03/2019 18:36   ASSESSMENT AND PLAN:    1.Hyponatremia ; patient presented with sodium level of 113.   This was thought to be due to patient being dehydrated from being on Lasix and hydrochlorothiazide.  Those were placed on hold on admission and patient started on IV fluids with normal saline.  Sodium level has improved to 117.  Monitoring serial sodium level.   Requested for hyponatremia work-up.  Serum osmolality of 244.  Urine osmolality of 383.  Urine  sodium of 30.  Follow-up on cortisol and TSH level. Patient also complained of shortness of breath.  Chest x-ray on admission negative.  Vital signs stable otherwise. Requested for CT chest to rule out underlying lung mass especially given presentation with hyponatremia.  Later reviewed CT report with large right sided pleural effusion. Requested for thoracentesis.  2.  Hypertension Blood pressure gradually trending up.  Resumed home dose of enalapril and atenolol.  3.  Diabetes mellitus type 2.  Blood sugars fairly controlled. Hold off on metformin.  Currently on sliding scale insulin coverage.  DVT prophylaxis; Lovenox   All the records are reviewed and case discussed with Care Management/Social Worker. Management plans discussed with the patient, family and they are in agreement. Called patient's daughter Ms Roland Rack; [phone] and updated her on treatment plans today.  All questions were answered.  CODE STATUS: Full Code  TOTAL TIME TAKING CARE OF THIS PATIENT: 37 minutes.   More than 50% of the time was spent in counseling/coordination of care: YES  POSSIBLE D/C IN 2 DAYS, DEPENDING ON CLINICAL CONDITION.   Diallo Ponder M.D on 02/04/2019 at 3:00 PM  Between 7am to 6pm - Pager - [phone]  After 6pm go to [email or web address] - Proofreader  [facility]  Office  [phone]  CC: Primary care physician; Sallee Lange, NP  Note: This dictation was prepared with Dragon dictation along with smaller phrase technology. Any transcriptional errors that result from this process are unintentional.

## 2019-02-05 ENCOUNTER — Inpatient Hospital Stay: Payer: Self-pay

## 2019-02-05 ENCOUNTER — Inpatient Hospital Stay: Payer: Medicare Other

## 2019-02-05 ENCOUNTER — Inpatient Hospital Stay (HOSPITAL_COMMUNITY)
Admit: 2019-02-05 | Discharge: 2019-02-05 | Disposition: A | Discharge status: Home / Self Care | Payer: Medicare Other | Attending: Specialist | Admitting: Specialist

## 2019-02-05 DIAGNOSIS — R0602 Shortness of breath: Secondary | ICD-10-CM

## 2019-02-05 LAB — GLUCOSE, CAPILLARY
Glucose-Capillary: 96 mg/dL (ref 70–99)
Glucose-Capillary: 111 mg/dL — ABNORMAL HIGH (ref 70–99)
Glucose-Capillary: 122 mg/dL — ABNORMAL HIGH (ref 70–99)
Glucose-Capillary: 108 mg/dL — ABNORMAL HIGH (ref 70–99)
Glucose-Capillary: 126 mg/dL — ABNORMAL HIGH (ref 70–99)

## 2019-02-05 LAB — CBC
HCT: 39.3 % (ref 36.0–46.0)
Hemoglobin: 12.6 g/dL (ref 12.0–15.0)
MCH: 24.3 pg — ABNORMAL LOW (ref 26.0–34.0)
MCHC: 32.1 g/dL (ref 30.0–36.0)
MCV: 75.7 fL — ABNORMAL LOW (ref 80.0–100.0)
Platelets: 266 10*3/uL (ref 150–400)
RBC: 5.19 MIL/uL — ABNORMAL HIGH (ref 3.87–5.11)
RDW: 13.5 % (ref 11.5–15.5)
WBC: 5.9 10*3/uL (ref 4.0–10.5)
nRBC: 0 % (ref 0.0–0.2)

## 2019-02-05 LAB — SODIUM
Sodium: 117 mmol/L — CL (ref 135–145)
Sodium: 117 mmol/L — CL (ref 135–145)
Sodium: 117 mmol/L — CL (ref 135–145)
Sodium: 118 mmol/L — CL (ref 135–145)

## 2019-02-05 LAB — BASIC METABOLIC PANEL
Anion gap: 8 (ref 5–15)
BUN: 7 mg/dL — ABNORMAL LOW (ref 8–23)
CO2: 30 mmol/L (ref 22–32)
Calcium: 8.5 mg/dL — ABNORMAL LOW (ref 8.9–10.3)
Chloride: 79 mmol/L — ABNORMAL LOW (ref 98–111)
Creatinine, Ser: 0.39 mg/dL — ABNORMAL LOW (ref 0.44–1.00)
GFR calc Af Amer: >60 mL/min (ref >60)
GFR calc non Af Amer: >60 mL/min (ref >60)
Glucose, Bld: 119 mg/dL — ABNORMAL HIGH (ref 70–99)
Potassium: 3.9 mmol/L (ref 3.5–5.1)
Sodium: 117 mmol/L — CL (ref 135–145)

## 2019-02-05 LAB — URIC ACID: Uric Acid, Serum: 2.2 mg/dL — ABNORMAL LOW (ref 2.5–7.1)

## 2019-02-05 LAB — MAGNESIUM: Magnesium: 1.7 mg/dL (ref 1.7–2.4)

## 2019-02-05 LAB — CORTISOL: Cortisol, Plasma: 22.6 ug/dL

## 2019-02-05 LAB — OSMOLALITY, URINE: Osmolality, Ur: 594 mOsm/kg (ref 300–900)

## 2019-02-05 LAB — MRSA PCR SCREENING: MRSA by PCR: NEGATIVE

## 2019-02-05 LAB — OSMOLALITY: Osmolality: 248 mOsm/kg — CL (ref 275–295)

## 2019-02-05 LAB — SODIUM, URINE, RANDOM: Sodium, Ur: 28 mmol/L

## 2019-02-05 LAB — TSH: TSH: 0.46 u[IU]/mL (ref 0.350–4.500)

## 2019-02-05 MED ORDER — SODIUM CHLORIDE 3 % IV SOLN
INTRAVENOUS | Status: DC
  Administered 2019-02-05: 30 mL/h via INTRAVENOUS
  Filled 2019-02-05 (×2): qty 500

## 2019-02-05 MED ORDER — ENOXAPARIN SODIUM 40 MG/0.4ML ~~LOC~~ SOLN: 40.0000 mg | SUBCUTANEOUS | Status: DC

## 2019-02-05 MED ORDER — SODIUM CHLORIDE 3 % IV SOLN: INTRAVENOUS | Status: DC

## 2019-02-05 MED ORDER — SODIUM CHLORIDE 3 % IV SOLN
INTRAVENOUS | Status: DC
  Administered 2019-02-05: 35 mL/h via INTRAVENOUS
  Filled 2019-02-05 (×2): qty 500

## 2019-02-05 MED ORDER — ENOXAPARIN SODIUM 40 MG/0.4ML ~~LOC~~ SOLN
40.0000 mg | SUBCUTANEOUS | Status: DC
  Administered 2019-02-05 – 2019-02-07 (×3): 40 mg via SUBCUTANEOUS
  Filled 2019-02-05 (×3): qty 0.4

## 2019-02-05 NOTE — Progress Notes (Signed)
Crit CRITICAL VALUE ALERT  Critical Value:  Sodium 117  Date & Time Notied:  02/05/2019  Provider Notified: Burman Nieves NP  Orders Received/Actions taken: Increase hypertonic solution rate to 35cc/hr

## 2019-02-05 NOTE — Consult Note (Addendum)
[facility] for Sodium Monitoring  Recent Labs: Potassium (mmol/L)  Date Value  02/05/2019 3.9   Magnesium (mg/dL)  Date Value  02/05/2019 1.7   Calcium (mg/dL)  Date Value  02/05/2019 8.5 (L)   Sodium (mmol/L)  Date Value  02/05/2019 117 (LL)     Assessment: Patient was started on hypertonic saline as a result of hyponatremia suspicious for SIADH. Currently, patient have orders for sodium chloride 3% @ 30 mL/hr.   Goal of Therapy:  Sodium WNL.   Plan:  Will monitor Na Q2H x2, then Q4H. If Na increase > 4 mEq in 2 hours or > 6 mEq/L in 4 hours contact pharmacy.   Update: Hypertonic infusion started @1651 ; sodium 117 @1440 .  Rowland Lathe ,PharmD Clinical Pharmacist 02/05/2019 2:12 PM

## 2019-02-05 NOTE — Consult Note (Signed)
[facility] for Sodium Monitoring Patient is on 3% sodium chloride continuous infusion. Follow up NA levels PRN with provider.  Recent Labs: Potassium (mmol/L)  Date Value  02/05/2019 3.9   Magnesium (mg/dL)  Date Value  02/05/2019 1.7   Calcium (mg/dL)  Date Value  02/05/2019 8.5 (L)   Sodium (mmol/L)  Date Value  02/05/2019 117 (LL)     Assessment: Patient was started on hypertonic saline as a result of hyponatremia suspicious for SIADH. Currently, patient have orders for sodium chloride 3% @ 30 mL/hr.   Goal of Therapy:  Sodium WNL.   Plan:  Will monitor Na Q2H x2, then Q4H. If Na increase > 4 mEq in 2 hours or > 6 mEq/L in 4 hours contact pharmacy.   Update @2135 : Called RN about sodium level and messaged Magdelene Tukov-Yual about increasing the rate of the hypertonic infusion. Rate increased to 35 mL/hr.     Rowland Lathe ,PharmD Clinical Pharmacist 02/05/2019 8:59 PM

## 2019-02-05 NOTE — Progress Notes (Signed)
[facility] at [place] NAME: Cathy Buck    MR#:  [id number]  DATE OF BIRTH:  [birth year]  SUBJECTIVE:   Patient admitted to the hospital secondary to shortness of breath and weakness and noted to be severely hyponatremic.  Sodium level still 117 today.  Patient is clinically asymptomatic though.  No other acute events or complaints presently.  REVIEW OF SYSTEMS:    Review of Systems  Constitutional: Negative for chills and fever.  HENT: Negative for congestion and tinnitus.   Eyes: Negative for blurred vision and double vision.  Respiratory: Negative for cough, shortness of breath and wheezing.   Cardiovascular: Negative for chest pain, orthopnea and PND.  Gastrointestinal: Negative for abdominal pain, diarrhea, nausea and vomiting.  Genitourinary: Negative for dysuria and hematuria.  Neurological: Positive for weakness (generalized. ). Negative for dizziness, sensory change and focal weakness.  All other systems reviewed and are negative.   Nutrition: Heart healthy/carb modified Tolerating Diet: yes Tolerating PT: Await Eval.    DRUG ALLERGIES:   Allergies  Allergen Reactions   Codeine Hives   Atorvastatin Other (See Comments)   Citalopram Other (See Comments)    Nausea   Keflex [Cephalexin] Diarrhea   Tylenol [Acetaminophen] Hives    VITALS:  Blood pressure (!) 155/73, pulse (!) 58, temperature 98.1 F (36.7 C), resp. rate (!) 21, height 5\' 4"  (1.626 m), weight 96.6 kg, SpO2 100 %.  PHYSICAL EXAMINATION:   Physical Exam  GENERAL:  78 y.o.-year-old patient lying in bed in no acute distress.  EYES: Pupils equal, round, reactive to light and accommodation. No scleral icterus. Extraocular muscles intact.  HEENT: Head atraumatic, normocephalic. Oropharynx and nasopharynx clear.  NECK:  Supple, no jugular venous distention. No thyroid enlargement, no tenderness.  LUNGS: Poor A/E on right lung fields,  no wheezing, rales,  rhonchi. No use of accessory muscles of respiration.  CARDIOVASCULAR: S1, S2 normal. No murmurs, rubs, or gallops.  ABDOMEN: Soft, nontender, nondistended. Bowel sounds present. No organomegaly or mass.  EXTREMITIES: No cyanosis, clubbing or edema b/l.    NEUROLOGIC: Cranial nerves II through XII are intact. No focal Motor or sensory deficits b/l. Globally weak.   PSYCHIATRIC: The patient is alert and oriented x 3.  SKIN: No obvious rash, lesion, or ulcer.    LABORATORY PANEL:   CBC Recent Labs  Lab 02/05/19 0556  WBC 5.9  HGB 12.6  HCT 39.3  PLT 266   ------------------------------------------------------------------------------------------------------------------  Chemistries  Recent Labs  Lab 02/05/19 0556  NA 117*  K 3.9  CL 79*  CO2 30  GLUCOSE 119*  BUN 7*  CREATININE 0.39*  CALCIUM 8.5*  MG 1.7   ------------------------------------------------------------------------------------------------------------------  Cardiac Enzymes No results for input(s): TROPONINI in the last 168 hours. ------------------------------------------------------------------------------------------------------------------  RADIOLOGY:  Ct Chest Wo Contrast  Result Date: 02/04/2019 CLINICAL DATA:  Shortness of breath EXAM: CT CHEST WITHOUT CONTRAST TECHNIQUE: Multidetector CT imaging of the chest was performed following the standard protocol without IV contrast. COMPARISON:  Plain film from previous day FINDINGS: Cardiovascular: Somewhat limited due to lack of IV contrast. Aortic calcifications are noted without significant aneurysmal dilatation. No cardiac enlargement is seen. No pericardial effusion is noted. Mediastinum/Nodes: The esophagus is within normal limits. No definitive hilar adenopathy is noted. There is a right paratracheal node which measures approximately 16 mm in short axis as well as a subcarinal node which measures 17 mm in short axis. These are likely reactive in nature.  Thoracic inlet is within normal limits. Lungs/Pleura: The left lung is well aerated with minimal atelectatic changes in the lingula along the cardiac border. No sizable effusion is noted. The right hemithorax demonstrates a moderate to large pleural effusion increased from the prior exam. Underlying patchy infiltrate is noted throughout the right lung with more marked consolidation in the right lung base. No definitive nodule is noted. Upper Abdomen: Somewhat limited due to lack of IV contrast. No definitive abnormality is noted. Musculoskeletal: Mild degenerative change of the thoracic spine is noted. No acute bony abnormality is seen. IMPRESSION: Moderate to large right-sided pleural effusion with associated consolidation most marked in the right lower lobe. Mild left basilar atelectasis. Prominent mediastinal lymph nodes likely reactive in nature. Aortic Atherosclerosis (ICD10-I70.0). Electronically Signed   By: Inez Catalina M.D.   On: 02/04/2019 15:36   Dg Chest Portable 1 View  Result Date: 02/03/2019 CLINICAL DATA:  Shortness of breath. EXAM: PORTABLE CHEST 1 VIEW COMPARISON:  None. FINDINGS: The heart size and pulmonary vascularity are normal. Aortic atherosclerosis. Small right pleural effusion. Focal linear atelectasis in the right midzone. No acute bone abnormality. IMPRESSION: 1. Small right pleural effusion. Slight atelectasis in the right midzone. 2. Aortic atherosclerosis. Electronically Signed   By: Lorriane Shire M.D.   On: 02/03/2019 18:36   [place] Ekg Site Rite  Result Date: 02/05/2019 If Site Rite image not attached, placement could not be confirmed due to current cardiac rhythm.    ASSESSMENT AND PLAN:   78 year old female with past medical history of diabetes, hypertension who presents to the hospital due to shortness of breath and weakness and noted to be severely hyponatremic.  1.  Acute respiratory failure with hypoxia-secondary to a right-sided pleural effusion. -Patient  underwent CT of the chest which showed a moderate to large right-sided pleural effusion with associated consolidation. -Continue O2 supplementation, will get diagnostic/therapeutic ultrasound-guided thoracentesis. -Follow clinically.  2.  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 -Sodium did not improve with IV fluid hydration.  Nephrology consult obtained and they recommend transferring patient to stepdown level of care and starting 3% saline. - Goal sodium level up to tomorrow is 121 in the next 24 hrs.  - will need PICC line.  - pt. Was also on diuretics HCTZ, Lasix and they are on hold for now.   3. Essential HTN - cont. Atenolol, Vasotec.  - BP stable.   4. DM Type II w/out complication - cont. SSI and follow BS  5. GERD - cont. Protonix.   All the records are reviewed and case discussed with Care Management/Social Worker. Management plans discussed with the patient, family and they are in agreement.  CODE STATUS: Full code  DVT Prophylaxis: Lovenox  TOTAL TIME TAKING CARE OF THIS PATIENT: 30 minutes.   POSSIBLE D/C IN 3-4 DAYS, DEPENDING ON CLINICAL CONDITION.   Henreitta Leber M.D on 02/05/2019 at 1:27 PM  Between 7am to 6pm - Pager - [phone]  After 6pm go to [email or web address] - Proofreader  [facility]  Office  [phone]  CC: Primary care physician; Sallee Lange, NP

## 2019-02-05 NOTE — Procedures (Signed)
Central Venous Catheter Placement:Double lumen PICC  Indication: Patient receiving vesicant or irritant drug.; Patient receiving intravenous therapy for longer than 5 days.; Patient has limited or no vascular access.   Consent: obtained from patient    Hand washing performed prior to starting the procedure.   Procedure:   An active timeout was performed and correct patient, name, & ID confirmed.   Patient was positioned correctly for central venous access.  Patient was prepped using strict sterile technique including chlorohexadine preps, sterile drape, sterile gown and sterile gloves.    The area was prepped, draped and anesthetized in the usual sterile manner. Patient comfort was obtained.    A double lumen catheter was placed in cephalic Vein There was good blood return, catheter caps were placed on lumens, catheter flushed easily, the line was secured and a sterile dressing and BIO-PATCH applied.   Ultrasound was used to visualize vasculature and guidance of needle.   Number of Attempts: 1 Complications:none Estimated Blood Loss: none Chest Radiograph indicated and ordered.      Cathy Buck, M.D.  [facility]

## 2019-02-05 NOTE — Consult Note (Signed)
CRITICAL CARE NOTE      CHIEF COMPLAINT:   Confusion with severe hyponatremia   History of present illness   This is a pleasant 78 yo female with hx of DM, essential htn on HCTZ and lasix at home reports feeling malaise and unusual fatigue over past 5 days.  She has smoked in the past from age [age] appx 1/2 pack per day. She reports weight loss in the past year but is unsure how much weight she lost. She does not abuse alcohol. She has no history of liver disease. She does not take SSRIs. She had bloodwork done and has normal TSH, cortisol.  Serum osm is hypotonic and patient is clincally euvolemic.  CT chest reviewed by me shows moderate right pleural effusion without layering and with subcarinal and right paratracheal lymphdenopathy. She has slightly improved since admission and has been evaluated by nephrologist who recommended to place central venous cathter and IV 3NS infusion.    PAST MEDICAL HISTORY   Past Medical History:  Diagnosis Date  . Diabetes mellitus without complication ([place])   . Hypertension      SURGICAL HISTORY   Past Surgical History:  Procedure Laterality Date  . ABDOMINAL HYSTERECTOMY    . CHOLECYSTECTOMY       FAMILY HISTORY   History reviewed. No pertinent family history.   SOCIAL HISTORY   Social History   Tobacco Use  . Smoking status: Former Research scientist (life sciences)  . Smokeless tobacco: Never Used  Substance Use Topics  . Alcohol use: Not Currently  . Drug use: Not Currently     MEDICATIONS   Current Medication:  Current Facility-Administered Medications:  .  atenolol (TENORMIN) tablet 50 mg, 50 mg, Oral, Daily, Ojie, Jude, MD, 50 mg at 02/05/19 0803 .  enalapril (VASOTEC) tablet 20 mg, 20 mg, Oral, BID, Ojie, Jude, MD, 20 mg at 02/05/19 0803 .  enoxaparin (LOVENOX)  injection 40 mg, 40 mg, Subcutaneous, Q24H, Sainani, Vivek J, MD .  hydrALAZINE (APRESOLINE) injection 10 mg, 10 mg, Intravenous, Q4H PRN, Lance Coon, MD .  insulin aspart (novoLOG) injection 0-5 Units, 0-5 Units, Subcutaneous, QHS, Lance Coon, MD .  insulin aspart (novoLOG) injection 0-9 Units, 0-9 Units, Subcutaneous, TID WC, Lance Coon, MD, 1 Units at 02/04/19 1713 .  ondansetron (ZOFRAN) tablet 4 mg, 4 mg, Oral, Q6H PRN **OR** ondansetron (ZOFRAN) injection 4 mg, 4 mg, Intravenous, Q6H PRN, Lance Coon, MD .  pantoprazole (PROTONIX) EC tablet 40 mg, 40 mg, Oral, Daily, Lance Coon, MD, 40 mg at 02/05/19 0803 .  sodium chloride (hypertonic) 3 % solution, , Intravenous, Continuous **AND** hypertonic 3% sodium chloride pharmacy monitoring, , , Until Discontinued, Lateef, Munsoor, MD .  traZODone (DESYREL) tablet 50 mg, 50 mg, Oral, QHS PRN, Lance Coon, MD, 50 mg at 02/04/19 2114    ALLERGIES   Codeine, Atorvastatin, Citalopram, Keflex [cephalexin], and Tylenol [acetaminophen]    REVIEW OF SYSTEMS    Unable to obtain due to confusion  PHYSICAL EXAMINATION   Vitals:   02/05/19 1500 02/05/19 1600  BP: (!) 141/90   Pulse: (!) 55 (!) 57  Resp: 19 20  Temp:    SpO2: 99% 99%    GENERAL:nad HEAD: Normocephalic, atraumatic.  EYES: Pupils equal, round, reactive to light.  No scleral icterus.  MOUTH: Moist mucosal membrane. NECK: Supple. No thyromegaly. No nodules. No JVD.  PULMONARY: decreased bs at right lung CARDIOVASCULAR: S1 and S2. Regular rate and rhythm. No murmurs, rubs, or gallops.  GASTROINTESTINAL: Soft, nontender,  non-distended. No masses. Positive bowel sounds. No hepatosplenomegaly.  MUSCULOSKELETAL: No swelling, clubbing, or edema.  NEUROLOGIC: Mild distress due to acute illness SKIN:intact,warm,dry   LABS AND IMAGING       LAB RESULTS: Recent Labs  Lab 02/03/19 1804  02/04/19 0255  02/05/19 0556 02/05/19 1245 02/05/19 1440  NA 113*   <  > 118*   < > 117* 117* 117*  K 3.4*  --  3.5  --  3.9  --   --   CL 74*  --  79*  --  79*  --   --   CO2 27  --  30  --  30  --   --   BUN 7*  --  7*  --  7*  --   --   CREATININE 0.43*  --  0.48  --  0.39*  --   --   GLUCOSE 135*  --  126*  --  119*  --   --    < > = values in this interval not displayed.   Recent Labs  Lab 02/03/19 1804 02/04/19 0255 02/05/19 0556  HGB 12.2 11.7* 12.6  HCT 36.6 36.1 39.3  WBC 6.2 6.1 5.9  PLT 274 236 266     IMAGING RESULTS: [place] Ekg Site Rite  Result Date: 02/05/2019 If Site Rite image not attached, placement could not be confirmed due to current cardiac rhythm.     ASSESSMENT AND PLAN    -Multidisciplinary rounds held today  Severe hypotonic euvolemic hyponatremia - s/p PICC line placement and initiation of 3NS - carefully monitoring Na with goal to inrease by 6-38meq/24hr period - diagnostic workup thus far unrevealing - possible SIADH due to HCTZ vs malignancy due to finding of Pleural effusion and hilar lymphadenopathy in context of fatigue and weight loss -   Right pleural effusion - moderate  - will perform bedside thoracentesis and evaluate for cytology (concern for malignancy) as well as typical microbiology workup   Neurology - mild confusion due to severe hyponatremia - will monitor closely, treating undelying cause    GI/Nutrition GI PROPHYLAXIS as indicated DIET-->TF's as tolerated Constipation protocol as indicated  ENDO - ICU hypoglycemic\Hyperglycemia protocol -check FSBS per protocol   ELECTROLYTES -follow labs as needed -replace as needed -pharmacy consultation   DVT/GI PRX ordered -SCDs  TRANSFUSIONS AS NEEDED MONITOR FSBS ASSESS the need for LABS as needed   Critical care provider statement:    Critical care time (minutes):  35   Critical care time was exclusive of:  Separately billable procedures and treating other patients   Critical care was necessary to treat or prevent imminent or  life-threatening deterioration of the following conditions:  Altered mental status, severe hyponatremia, DM, Right pleural effusion, multiple comorbid conditions.    Critical care was time spent personally by me on the following activities:  Development of treatment plan with patient or surrogate, discussions with consultants, evaluation of patient's response to treatment, examination of patient, obtaining history from patient or surrogate, ordering and performing treatments and interventions, ordering and review of laboratory studies and re-evaluation of patient's condition.  I assumed direction of critical care for this patient from another provider in my specialty: no    This document was prepared using Dragon voice recognition software and may include unintentional dictation errors.    Ottie Glazier, M.D.  Div[facility] of [facility]

## 2019-02-05 NOTE — Consult Note (Signed)
[facility] CONSULT NOTE    Date: 02/05/2019                  Patient Name:  Cathy Buck  MRN: [id number]  DOB: 1941/04/21  Age / Sex: 78 y.o., female         PCP: Dayton Martes Victoriano Lain, NP                 Service Requesting Consult: Hospitalist                 Reason for Consult: Hyponatremia            History of Present Illness: Patient is a 78 y.o. female with a PMHx of diabetes mellitus type 2, hypertension, hyperlipidemia, GERD, cervical myelopathy, lumbar stenosis, seasonal allergies, degenerative disc disease, vitamin D deficiency who was admitted to [facility] on 02/03/2019 for evaluation of significant shortness of breath and weakness.  She was found to have a large right pleural effusion with underlying consolidation as well.  In addition she was noted as having a very low sodium of 113.  Serum sodium now 117.  Patient was a smoker in the past.  She currently denies nausea, vomiting, or diarrhea.  Patient was noted to be on HCTZ as well as furosemide at home.  She does endorse weakness.   Medications: Outpatient medications: Medications Prior to Admission  Medication Sig Dispense Refill Last Dose  . aspirin EC 81 MG tablet Take 81 mg by mouth daily.   02/03/2019 at 0800  . atenolol (TENORMIN) 50 MG tablet Take 50 mg by mouth daily.   02/03/2019 at 0800  . enalapril (VASOTEC) 20 MG tablet Take 20 mg by mouth 2 (two) times a day.   02/03/2019 at 0800  . furosemide (LASIX) 40 MG tablet Take 40 mg by mouth every morning.   02/03/2019 at 0800  . hydrochlorothiazide (HYDRODIURIL) 25 MG tablet Take 25 mg by mouth daily.   02/03/2019 at 0800  . lisinopril (ZESTRIL) 10 MG tablet Take 10 mg by mouth daily.   02/03/2019 at 0800  . metFORMIN (GLUCOPHAGE) 1000 MG tablet Take 500 mg by mouth daily.   02/02/2019 at 1800  . omeprazole (PRILOSEC) 40 MG capsule Take 40 mg by mouth daily. 30 minutes before food and meds   Past Week at Unknown time  . simvastatin (ZOCOR) 20 MG  tablet Take 20 mg by mouth daily.   02/02/2019 at 1800  . Calcium Carb-Cholecalciferol (CALCIUM-VITAMIN D) 500-200 MG-UNIT tablet Take 1 tablet by mouth 2 (two) times a day.   Not Taking at Unknown time    Current medications: Current Facility-Administered Medications  Medication Dose Route Frequency Provider Last Rate Last Dose  . atenolol (TENORMIN) tablet 50 mg  50 mg Oral Daily Ojie, Jude, MD   50 mg at 02/05/19 0803  . enalapril (VASOTEC) tablet 20 mg  20 mg Oral BID Stark Jock, Jude, MD   20 mg at 02/05/19 0803  . hydrALAZINE (APRESOLINE) injection 10 mg  10 mg Intravenous Q4H PRN Lance Coon, MD      . insulin aspart (novoLOG) injection 0-5 Units  0-5 Units Subcutaneous QHS Lance Coon, MD      . insulin aspart (novoLOG) injection 0-9 Units  0-9 Units Subcutaneous TID WC Lance Coon, MD   1 Units at 02/04/19 1713  . ondansetron (ZOFRAN) tablet 4 mg  4 mg Oral Q6H PRN Lance Coon, MD       Or  . ondansetron (  ZOFRAN) injection 4 mg  4 mg Intravenous Q6H PRN Lance Coon, MD      . pantoprazole (PROTONIX) EC tablet 40 mg  40 mg Oral Daily Lance Coon, MD   40 mg at 02/05/19 0803  . traZODone (DESYREL) tablet 50 mg  50 mg Oral QHS PRN Lance Coon, MD   50 mg at 02/04/19 2114      Allergies: Allergies  Allergen Reactions  . Codeine Hives  . Atorvastatin Other (See Comments)  . Citalopram Other (See Comments)    Nausea  . Keflex [Cephalexin] Diarrhea  . Tylenol [Acetaminophen] Hives      Past Medical History: Past Medical History:  Diagnosis Date  . Diabetes mellitus without complication ([place])   . Hypertension      Past Surgical History: Past Surgical History:  Procedure Laterality Date  . ABDOMINAL HYSTERECTOMY    . CHOLECYSTECTOMY       Family History: History reviewed. No pertinent family history.   Social History: Social History   Socioeconomic History  . Marital status: Married    Spouse name: Not on file  . Number of children: Not on file  .  Years of education: Not on file  . Highest education level: Not on file  Occupational History  . Not on file  Social Needs  . Financial resource strain: Not hard at all  . Food insecurity    Worry: Never true    Inability: Never true  . Transportation needs    Medical: No    Non-medical: No  Tobacco Use  . Smoking status: Former Research scientist (life sciences)  . Smokeless tobacco: Never Used  Substance and Sexual Activity  . Alcohol use: Not Currently  . Drug use: Not Currently  . Sexual activity: Not Currently  Lifestyle  . Physical activity    Days per week: 0 days    Minutes per session: 0 min  . Stress: Not at all  Relationships  . Social Herbalist on phone: Once a week    Gets together: Once a week    Attends religious service: 1 to 4 times per year    Active member of club or organization: No    Attends meetings of clubs or organizations: Never    Relationship status: Married  . Intimate partner violence    Fear of current or ex partner: No    Emotionally abused: No    Physically abused: No    Forced sexual activity: No  Other Topics Concern  . Not on file  Social History Narrative   Lives at home with husband.  Uses cane, walker and rollator a times     Review of Systems: Review of Systems  Constitutional: Positive for malaise/fatigue. Negative for chills and fever.  HENT: Negative for congestion, hearing loss and tinnitus.   Eyes: Negative for blurred vision and double vision.  Respiratory: Positive for shortness of breath. Negative for cough, hemoptysis and sputum production.   Cardiovascular: Negative for chest pain, palpitations and orthopnea.  Gastrointestinal: Negative for diarrhea, heartburn, nausea and vomiting.  Genitourinary: Negative for dysuria and urgency.  Musculoskeletal: Negative for falls and myalgias.  Skin: Negative for itching and rash.  Neurological: Positive for weakness. Negative for dizziness and loss of consciousness.  Endo/Heme/Allergies:  Negative for polydipsia. Does not bruise/bleed easily.  Psychiatric/Behavioral: Negative for depression. The patient is not nervous/anxious.      Vital Signs: Blood pressure (!) 155/73, pulse (!) 58, temperature 98.1 F (36.7 C), resp. rate Marland Kitchen)  21, height 5\' 4"  (1.626 m), weight 96.6 kg, SpO2 100 %.  Weight trends: Filed Weights   02/03/19 1729  Weight: 96.6 kg    Physical Exam: General: NAD, resting in bed  Head: Normocephalic, atraumatic.  Eyes: Anicteric, EOMI  Nose: Mucous membranes moist, not inflammed, nonerythematous.  Throat: Oropharynx nonerythematous, no exudate appreciated.   Neck: Supple, trachea midline.  Lungs:  Normal respiratory effort.  Diminished at right base  Heart: RRR. S1 and S2 normal without gallop, murmur, or rubs.  Abdomen:  BS normoactive. Soft, Nondistended, non-tender.  No masses or organomegaly.  Extremities: No pretibial edema.  Neurologic: A&O X3, Motor strength is 5/5 in the all 4 extremities  Skin: No visible rashes, scars.    Lab results: Basic Metabolic Panel: Recent Labs  Lab 02/03/19 1804  02/04/19 0255  02/04/19 1649 02/04/19 2040 02/05/19 0556  NA 113*   < > 118*   < > 118* 118* 117*  K 3.4*  --  3.5  --   --   --  3.9  CL 74*  --  79*  --   --   --  79*  CO2 27  --  30  --   --   --  30  GLUCOSE 135*  --  126*  --   --   --  119*  BUN 7*  --  7*  --   --   --  7*  CREATININE 0.43*  --  0.48  --   --   --  0.39*  CALCIUM 8.9  --  8.7*  --   --   --  8.5*  MG  --   --   --   --   --   --  1.7   < > = values in this interval not displayed.    Liver Function Tests: No results for input(s): AST, ALT, ALKPHOS, BILITOT, PROT, ALBUMIN in the last 168 hours. No results for input(s): LIPASE, AMYLASE in the last 168 hours. No results for input(s): AMMONIA in the last 168 hours.  CBC: Recent Labs  Lab 02/03/19 1804 02/04/19 0255 02/05/19 0556  WBC 6.2 6.1 5.9  NEUTROABS 4.5  --   --   HGB 12.2 11.7* 12.6  HCT 36.6 36.1 39.3   MCV 74.4* 75.5* 75.7*  PLT 274 236 266    Cardiac Enzymes: No results for input(s): CKTOTAL, CKMB, CKMBINDEX, TROPONINI in the last 168 hours.  BNP: Invalid input(s): POCBNP  CBG: Recent Labs  Lab 02/04/19 1635 02/04/19 2113 02/04/19 2157 02/05/19 0758 02/05/19 1155  GLUCAP 134* 112* 90 96 111*    Microbiology: Results for orders placed or performed during the hospital encounter of 02/03/19  SARS Coronavirus 2 (CEPHEID- Performed in [facility] hospital lab), Hosp Order     Status: None   Collection Time: 02/03/19  6:55 PM   Specimen: Nasopharyngeal Swab  Result Value Ref Range Status   SARS Coronavirus 2 NEGATIVE NEGATIVE Final    Comment: (NOTE) If result is NEGATIVE SARS-CoV-2 target nucleic acids are NOT DETECTED. The SARS-CoV-2 RNA is generally detectable in upper and lower  respiratory specimens during the acute phase of infection. The lowest  concentration of SARS-CoV-2 viral copies this assay can detect is 250  copies / mL. A negative result does not preclude SARS-CoV-2 infection  and should not be used as the sole basis for treatment or other  patient management decisions.  A negative result may occur with  improper specimen  collection / handling, submission of specimen other  than nasopharyngeal swab, presence of viral mutation(s) within the  areas targeted by this assay, and inadequate number of viral copies  (<250 copies / mL). A negative result must be combined with clinical  observations, patient history, and epidemiological information. If result is POSITIVE SARS-CoV-2 target nucleic acids are DETECTED. The SARS-CoV-2 RNA is generally detectable in upper and lower  respiratory specimens dur ing the acute phase of infection.  Positive  results are indicative of active infection with SARS-CoV-2.  Clinical  correlation with patient history and other diagnostic information is  necessary to determine patient infection status.  Positive results do  not  rule out bacterial infection or co-infection with other viruses. If result is PRESUMPTIVE POSTIVE SARS-CoV-2 nucleic acids MAY BE PRESENT.   A presumptive positive result was obtained on the submitted specimen  and confirmed on repeat testing.  While 2019 novel coronavirus  (SARS-CoV-2) nucleic acids may be present in the submitted sample  additional confirmatory testing may be necessary for epidemiological  and / or clinical management purposes  to differentiate between  SARS-CoV-2 and other Sarbecovirus currently known to infect humans.  If clinically indicated additional testing with an alternate test  methodology [phone]) is advised. The SARS-CoV-2 RNA is generally  detectable in upper and lower respiratory sp ecimens during the acute  phase of infection. The expected result is Negative. Fact Sheet for Patients:  StrictlyIdeas.no Fact Sheet for Healthcare Providers: BankingDealers.co.za This test is not yet approved or cleared by the [place] FDA and has been authorized for detection and/or diagnosis of SARS-CoV-2 by FDA under an Emergency Use Authorization (EUA).  This EUA will remain in effect (meaning this test can be used) for the duration of the COVID-19 declaration under Section 564(b)(1) of the Act, 21 U.S.C. section 360bbb-3(b)(1), unless the authorization is terminated or revoked sooner. Performed at [facility], [address] [place], [place] [address]     Coagulation Studies: No results for input(s): LABPROT, INR in the last 72 hours.  Urinalysis: Recent Labs    02/04/19 0547  COLORURINE YELLOW*  LABSPEC 1.014  PHURINE 6.0  GLUCOSEU NEGATIVE  HGBUR NEGATIVE  BILIRUBINUR NEGATIVE  KETONESUR 20*  PROTEINUR 100*  NITRITE NEGATIVE  LEUKOCYTESUR NEGATIVE      Imaging: Ct Chest Wo Contrast  Result Date: 02/04/2019 CLINICAL DATA:  Shortness of breath EXAM: CT CHEST WITHOUT CONTRAST TECHNIQUE:  Multidetector CT imaging of the chest was performed following the standard protocol without IV contrast. COMPARISON:  Plain film from previous day FINDINGS: Cardiovascular: Somewhat limited due to lack of IV contrast. Aortic calcifications are noted without significant aneurysmal dilatation. No cardiac enlargement is seen. No pericardial effusion is noted. Mediastinum/Nodes: The esophagus is within normal limits. No definitive hilar adenopathy is noted. There is a right paratracheal node which measures approximately 16 mm in short axis as well as a subcarinal node which measures 17 mm in short axis. These are likely reactive in nature. Thoracic inlet is within normal limits. Lungs/Pleura: The left lung is well aerated with minimal atelectatic changes in the lingula along the cardiac border. No sizable effusion is noted. The right hemithorax demonstrates a moderate to large pleural effusion increased from the prior exam. Underlying patchy infiltrate is noted throughout the right lung with more marked consolidation in the right lung base. No definitive nodule is noted. Upper Abdomen: Somewhat limited due to lack of IV contrast. No definitive abnormality is noted. Musculoskeletal: Mild degenerative change of  the thoracic spine is noted. No acute bony abnormality is seen. IMPRESSION: Moderate to large right-sided pleural effusion with associated consolidation most marked in the right lower lobe. Mild left basilar atelectasis. Prominent mediastinal lymph nodes likely reactive in nature. Aortic Atherosclerosis (ICD10-I70.0). Electronically Signed   By: Inez Catalina M.D.   On: 02/04/2019 15:36   Dg Chest Portable 1 View  Result Date: 02/03/2019 CLINICAL DATA:  Shortness of breath. EXAM: PORTABLE CHEST 1 VIEW COMPARISON:  None. FINDINGS: The heart size and pulmonary vascularity are normal. Aortic atherosclerosis. Small right pleural effusion. Focal linear atelectasis in the right midzone. No acute bone abnormality.  IMPRESSION: 1. Small right pleural effusion. Slight atelectasis in the right midzone. 2. Aortic atherosclerosis. Electronically Signed   By: Lorriane Shire M.D.   On: 02/03/2019 18:36   [place] Ekg Site Rite  Result Date: 02/05/2019 If Site Rite image not attached, placement could not be confirmed due to current cardiac rhythm.     Assessment & Plan: Pt is a 78 y.o. female with a PMHx of diabetes mellitus type 2, hypertension, hyperlipidemia, GERD, cervical myelopathy, lumbar stenosis, seasonal allergies, degenerative disc disease, vitamin D deficiency who was admitted to [facility] on 02/03/2019 for evaluation of significant shortness of breath and weakness.  She was found to have a large right pleural effusion with underlying consolidation as well.   1.  Hyponatremia, suspicious for SIADH. 2.  Hypertension. 3.  Right-sided pleural effusion.  Plan: We are asked to see the patient for severe hyponatremia.  Serum sodium was Cisco currently 117.  We recommend transfer to the critical care unit for initiation of 3% saline at 30 cc/h.  She will need a PICC line for this purpose as well.  Case discussed with hospitalist.  Recommend target sodium of 121 within 24 hours from admission.  There may be an underlying lung mass that could potentially be accounting for the SIADH.  Further plan to be determined given response to treatment.  Thanks for consultation.

## 2019-02-06 LAB — BLOOD GAS, ARTERIAL
Acid-Base Excess: 1.6 mmol/L (ref 0.0–2.0)
Bicarbonate: 29.5 mmol/L — ABNORMAL HIGH (ref 20.0–28.0)
FIO2: 0.32
O2 Saturation: 97.3 %
Patient temperature: 37
pCO2 arterial: 60 mmHg — ABNORMAL HIGH (ref 32.0–48.0)
pH, Arterial: 7.3 — ABNORMAL LOW (ref 7.350–7.450)
pO2, Arterial: 103 mmHg (ref 83.0–108.0)

## 2019-02-06 LAB — SODIUM
Sodium: 120 mmol/L — ABNORMAL LOW (ref 135–145)
Sodium: 133 mmol/L — ABNORMAL LOW (ref 135–145)
Sodium: 122 mmol/L — ABNORMAL LOW (ref 135–145)
Sodium: 124 mmol/L — ABNORMAL LOW (ref 135–145)
Sodium: 124 mmol/L — ABNORMAL LOW (ref 135–145)
Sodium: 126 mmol/L — ABNORMAL LOW (ref 135–145)

## 2019-02-06 LAB — ECHOCARDIOGRAM COMPLETE
Height: 64 in
Weight: 3809.55 oz

## 2019-02-06 LAB — CBC
HCT: 37.5 % (ref 36.0–46.0)
Hemoglobin: 12 g/dL (ref 12.0–15.0)
MCH: 24.7 pg — ABNORMAL LOW (ref 26.0–34.0)
MCHC: 32 g/dL (ref 30.0–36.0)
MCV: 77.2 fL — ABNORMAL LOW (ref 80.0–100.0)
Platelets: 264 10*3/uL (ref 150–400)
RBC: 4.86 MIL/uL (ref 3.87–5.11)
RDW: 13.6 % (ref 11.5–15.5)
WBC: 6.9 10*3/uL (ref 4.0–10.5)
nRBC: 0 % (ref 0.0–0.2)

## 2019-02-06 LAB — PHOSPHORUS: Phosphorus: 2.1 mg/dL — ABNORMAL LOW (ref 2.5–4.6)

## 2019-02-06 LAB — GLUCOSE, CAPILLARY
Glucose-Capillary: 89 mg/dL (ref 70–99)
Glucose-Capillary: 142 mg/dL — ABNORMAL HIGH (ref 70–99)
Glucose-Capillary: 113 mg/dL — ABNORMAL HIGH (ref 70–99)
Glucose-Capillary: 112 mg/dL — ABNORMAL HIGH (ref 70–99)

## 2019-02-06 LAB — MAGNESIUM: Magnesium: 1.7 mg/dL (ref 1.7–2.4)

## 2019-02-06 MED ORDER — SODIUM CHLORIDE 3 % IV SOLN
INTRAVENOUS | Status: DC
  Administered 2019-02-06 – 2019-02-07 (×2): 35 mL/h via INTRAVENOUS
  Administered 2019-02-07: 11:00:00 20 mL/h via INTRAVENOUS
  Filled 2019-02-06 (×5): qty 500

## 2019-02-06 NOTE — Progress Notes (Signed)
[facility]  ROUNDING NOTE   Subjective:  Patient moved over to critical care unit for initiation of 3% saline. At 539 this a.m. blood was drawn and sodium came back at 133. However we felt that this was erroneous. This was repeated and sodium came back at 122. She was reinitiated on 3% saline. Sodium now is 124. Daughter at bedside.   Objective:  Vital signs in last 24 hours:  Temp:  [96.4 F (35.8 C)-97.7 F (36.5 C)] 97.7 F (36.5 C) (07/26 1000) Pulse Rate:  [55-84] 84 (07/26 1000) Resp:  [14-24] 17 (07/26 1000) BP: (99-182)/(55-90) 138/63 (07/26 1000) SpO2:  [93 %-99 %] 98 % (07/26 1000) FiO2 (%):  [30 %] 30 % (07/26 0114) Weight:  [108 kg] 108 kg (07/25 1414)  Weight change:  Filed Weights   02/03/19 1729 02/05/19 1414  Weight: 96.6 kg 108 kg    Intake/Output: I/O last 3 completed shifts: In: 528.3 [P.O.:240; I.V.:288.3] Out: 1870 [LKGMW:1027]   Intake/Output this shift:  No intake/output data recorded.  Physical Exam: General: No acute distress  Head: Normocephalic, atraumatic. Moist oral mucosal membranes  Eyes: Anicteric  Neck: Supple, trachea midline  Lungs:  Clear to auscultation, normal effort  Heart: S1S2 no rubs  Abdomen:  Soft, nontender, bowel sounds present  Extremities: Trace peripheral edema.  Neurologic: Awake, alert, following commands  Skin: No lesions       Basic Metabolic Panel: Recent Labs  Lab 02/03/19 1804  02/04/19 0255  02/05/19 0556  02/05/19 2027 02/06/19 0120 02/06/19 0539 02/06/19 0657 02/06/19 1318  NA 113*   < > 118*   < > 117*   < > 118* 120* 133* 122* 124*  K 3.4*  --  3.5  --  3.9  --   --   --   --   --   --   CL 74*  --  79*  --  79*  --   --   --   --   --   --   CO2 27  --  30  --  30  --   --   --   --   --   --   GLUCOSE 135*  --  126*  --  119*  --   --   --   --   --   --   BUN 7*  --  7*  --  7*  --   --   --   --   --   --   CREATININE 0.43*  --  0.48  --  0.39*  --   --   --   --   --    --   CALCIUM 8.9  --  8.7*  --  8.5*  --   --   --   --   --   --   MG  --   --   --   --  1.7  --   --   --  1.7  --   --   PHOS  --   --   --   --   --   --   --   --  2.1*  --   --    < > = values in this interval not displayed.    Liver Function Tests: No results for input(s): AST, ALT, ALKPHOS, BILITOT, PROT, ALBUMIN in the last 168 hours. No results for input(s): LIPASE, AMYLASE in the last  168 hours. No results for input(s): AMMONIA in the last 168 hours.  CBC: Recent Labs  Lab 02/03/19 1804 02/04/19 0255 02/05/19 0556 02/06/19 0539  WBC 6.2 6.1 5.9 6.9  NEUTROABS 4.5  --   --   --   HGB 12.2 11.7* 12.6 12.0  HCT 36.6 36.1 39.3 37.5  MCV 74.4* 75.5* 75.7* 77.2*  PLT 274 236 266 264    Cardiac Enzymes: No results for input(s): CKTOTAL, CKMB, CKMBINDEX, TROPONINI in the last 168 hours.  BNP: Invalid input(s): POCBNP  CBG: Recent Labs  Lab 02/05/19 1416 02/05/19 1621 02/05/19 2204 02/06/19 0743 02/06/19 1212  GLUCAP 122* 108* 126* 89 142*    Microbiology: Results for orders placed or performed during the hospital encounter of 02/03/19  SARS Coronavirus 2 (CEPHEID- Performed in [place] hospital lab), Hosp Order     Status: None   Collection Time: 02/03/19  6:55 PM   Specimen: Nasopharyngeal Swab  Result Value Ref Range Status   SARS Coronavirus 2 NEGATIVE NEGATIVE Final    Comment: (NOTE) If result is NEGATIVE SARS-CoV-2 target nucleic acids are NOT DETECTED. The SARS-CoV-2 RNA is generally detectable in upper and lower  respiratory specimens during the acute phase of infection. The lowest  concentration of SARS-CoV-2 viral copies this assay can detect is 250  copies / mL. A negative result does not preclude SARS-CoV-2 infection  and should not be used as the sole basis for treatment or other  patient management decisions.  A negative result may occur with  improper specimen collection / handling, submission of specimen other  than nasopharyngeal  swab, presence of viral mutation(s) within the  areas targeted by this assay, and inadequate number of viral copies  (<250 copies / mL). A negative result must be combined with clinical  observations, patient history, and epidemiological information. If result is POSITIVE SARS-CoV-2 target nucleic acids are DETECTED. The SARS-CoV-2 RNA is generally detectable in upper and lower  respiratory specimens dur ing the acute phase of infection.  Positive  results are indicative of active infection with SARS-CoV-2.  Clinical  correlation with patient history and other diagnostic information is  necessary to determine patient infection status.  Positive results do  not rule out bacterial infection or co-infection with other viruses. If result is PRESUMPTIVE POSTIVE SARS-CoV-2 nucleic acids MAY BE PRESENT.   A presumptive positive result was obtained on the submitted specimen  and confirmed on repeat testing.  While 2019 novel coronavirus  (SARS-CoV-2) nucleic acids may be present in the submitted sample  additional confirmatory testing may be necessary for epidemiological  and / or clinical management purposes  to differentiate between  SARS-CoV-2 and other Sarbecovirus currently known to infect humans.  If clinically indicated additional testing with an alternate test  methodology [phone]) is advised. The SARS-CoV-2 RNA is generally  detectable in upper and lower respiratory sp ecimens during the acute  phase of infection. The expected result is Negative. Fact Sheet for Patients:  StrictlyIdeas.no Fact Sheet for Healthcare Providers: BankingDealers.co.za This test is not yet approved or cleared by the [place] FDA and has been authorized for detection and/or diagnosis of SARS-CoV-2 by FDA under an Emergency Use Authorization (EUA).  This EUA will remain in effect (meaning this test can be used) for the duration of the COVID-19 declaration  under Section 564(b)(1) of the Act, 21 U.S.C. section 360bbb-3(b)(1), unless the authorization is terminated or revoked sooner. Performed at [facility], [address] [place], [place] [address]  MRSA PCR Screening     Status: None   Collection Time: 02/05/19  2:17 PM   Specimen: Nasopharyngeal  Result Value Ref Range Status   MRSA by PCR NEGATIVE NEGATIVE Final    Comment:        The GeneXpert MRSA Assay (FDA approved for NASAL specimens only), is one component of a comprehensive MRSA colonization surveillance program. It is not intended to diagnose MRSA infection nor to guide or monitor treatment for MRSA infections. Performed at [facility], [address] [place], [place] [address]     Coagulation Studies: No results for input(s): LABPROT, INR in the last 72 hours.  Urinalysis: Recent Labs    02/04/19 0547  COLORURINE YELLOW*  LABSPEC 1.014  PHURINE 6.0  GLUCOSEU NEGATIVE  HGBUR NEGATIVE  BILIRUBINUR NEGATIVE  KETONESUR 20*  PROTEINUR 100*  NITRITE NEGATIVE  LEUKOCYTESUR NEGATIVE      Imaging: Ct Chest Wo Contrast  Result Date: 02/04/2019 CLINICAL DATA:  Shortness of breath EXAM: CT CHEST WITHOUT CONTRAST TECHNIQUE: Multidetector CT imaging of the chest was performed following the standard protocol without IV contrast. COMPARISON:  Plain film from previous day FINDINGS: Cardiovascular: Somewhat limited due to lack of IV contrast. Aortic calcifications are noted without significant aneurysmal dilatation. No cardiac enlargement is seen. No pericardial effusion is noted. Mediastinum/Nodes: The esophagus is within normal limits. No definitive hilar adenopathy is noted. There is a right paratracheal node which measures approximately 16 mm in short axis as well as a subcarinal node which measures 17 mm in short axis. These are likely reactive in nature. Thoracic inlet is within normal limits. Lungs/Pleura: The left lung is well aerated with  minimal atelectatic changes in the lingula along the cardiac border. No sizable effusion is noted. The right hemithorax demonstrates a moderate to large pleural effusion increased from the prior exam. Underlying patchy infiltrate is noted throughout the right lung with more marked consolidation in the right lung base. No definitive nodule is noted. Upper Abdomen: Somewhat limited due to lack of IV contrast. No definitive abnormality is noted. Musculoskeletal: Mild degenerative change of the thoracic spine is noted. No acute bony abnormality is seen. IMPRESSION: Moderate to large right-sided pleural effusion with associated consolidation most marked in the right lower lobe. Mild left basilar atelectasis. Prominent mediastinal lymph nodes likely reactive in nature. Aortic Atherosclerosis (ICD10-I70.0). Electronically Signed   By: Inez Catalina M.D.   On: 02/04/2019 15:36   Dg Chest Port 1 View  Result Date: 02/05/2019 CLINICAL DATA:  PICC line placement EXAM: PORTABLE CHEST 1 VIEW COMPARISON:  Portable exam 2225 hours compared to 1631 hours FINDINGS: RIGHT arm PICC line with tip projecting over superior RIGHT atrium. May consider withdrawing catheter 2.5-3.0 cm to place tip at cavoatrial junction. Stable heart size and mediastinal contours. RIGHT pleural effusion and basilar atelectasis with questionable RIGHT perihilar infiltrate. Minimal LEFT base atelectasis. IMPRESSION: Tip of RIGHT arm PICC line projects over RIGHT atrium; recommend withdrawal 2.5 x 3.0 cm to place tip at approximately the cavoatrial junction. Otherwise no interval change. Electronically Signed   By: Lavonia Dana M.D.   On: 02/05/2019 22:34   Dg Chest Port 1 View  Result Date: 02/05/2019 CLINICAL DATA:  PICC line placement EXAM: PORTABLE CHEST 1 VIEW COMPARISON:  Portable exam 1613 hours compared to 02/03/2019 FINDINGS: RIGHT arm PICC line tip deflects into the azygos arch. Upper normal heart size. Mediastinal contours and pulmonary  vascularity normal. Atherosclerotic calcification aorta. RIGHT lung infiltrate with small RIGHT  pleural effusion and basilar atelectasis. LEFT lung clear. No pneumothorax or acute osseous findings. IMPRESSION: Tip of RIGHT arm PICC line deflects into the azygos arch, recommend repositioning into SVC. Increased RIGHT lung infiltrates, RIGHT pleural effusion and basilar atelectasis. Findings called to [facility] in ICU on 02/05/2019 at 1839 hrs. Electronically Signed   By: Lavonia Dana M.D.   On: 02/05/2019 18:40   [place] Ekg Site Rite  Result Date: 02/05/2019 If Site Rite image not attached, placement could not be confirmed due to current cardiac rhythm.    Medications:   . sodium chloride (hypertonic) 35 mL/hr (02/06/19 1018)   . atenolol  50 mg Oral Daily  . enalapril  20 mg Oral BID  . enoxaparin (LOVENOX) injection  40 mg Subcutaneous Q24H  . insulin aspart  0-5 Units Subcutaneous QHS  . insulin aspart  0-9 Units Subcutaneous TID WC  . pantoprazole  40 mg Oral Daily   hydrALAZINE, ondansetron **OR** ondansetron (ZOFRAN) IV, traZODone  Assessment/ Plan:  78 y.o. African American female with a PMHx of diabetes mellitus type 2, hypertension, hyperlipidemia, GERD, cervical myelopathy, lumbar stenosis, seasonal allergies, degenerative disc disease, vitamin D deficiency who was admitted to [facility] on 02/03/2019 for evaluation of significant shortness of breath and weakness.  She was found to have a large right pleural effusion with underlying consolidation as well.   1.  Hyponatremia, suspicious for SIADH. 2.  Hypertension. 3.  Right-sided pleural effusion.  Plan: Patient seen at bedside.  She was transferred over to the critical care unit for the purposes of 3% saline administration.  She is tolerating 3% saline well.  Continue hypertonic saline at 35 cc/h.  Serum sodium currently 124.  Target sodium within the next 24 hours is 132.  In addition she would likely have thoracentesis.  There is some  concern that there is an underlying lung mass that is not being seen given the lung consolidation noted on chest CT.  LOS: 3 Josey Dettmann 7/26/20201:53 PM

## 2019-02-06 NOTE — Consult Note (Signed)
[place] for Sodium Monitoring Patient is on 3% sodium chloride continuous infusion. Follow up NA levels PRN with provider.  Recent Labs: Potassium (mmol/L)  Date Value  02/05/2019 3.9   Magnesium (mg/dL)  Date Value  02/05/2019 1.7   Calcium (mg/dL)  Date Value  02/05/2019 8.5 (L)   Sodium (mmol/L)  Date Value  02/06/2019 120 (L)     Assessment: Patient was started on hypertonic saline as a result of hyponatremia suspicious for SIADH.   Patient started on  sodium chloride 3% @ 30 mL/hr on 7/25 @ 1651  7/25 @ 1417: Na: 117 7/25 @ 1814: Na 117 7/25 @ 2027: Na 118 - Infusion increased to 35 mL/hr  7/26 @ 0120: Na 120  Goal of Therapy:  Sodium WNL.   If Na increase > 4 mEq in 2 hours or > 6 mEq/L in 4 hours contact pharmacy.   Plan:  7/26 @ 0120 :Na 120.   Patient currently receiving Sodium Chloride 3% @ 82ml/Hr.   Na Level every 4 hours.  Next Na Level @ [place], PharmD, BCPS Clinical Pharmacist 02/06/2019 2:02 AM

## 2019-02-06 NOTE — Progress Notes (Signed)
Patient increased work of breathing. NP made aare new orders rec'd and carried out.

## 2019-02-06 NOTE — Progress Notes (Signed)
Patient had no urine output for several hours. Bladder scanned for 652cc. NP made aware and new orders for foley cath. Foley placed and 1100cc urine returned. Will cont to monitor and endorse.

## 2019-02-06 NOTE — Progress Notes (Signed)
Dr. Lanney Gins said to not let her sodium go up more than 6 points in 24 hours.  It should be raised gradually.  RN notified him of new sodium lab which was up 2 points from previous result.  Patient's daughter visited today.  Patient and daughter used cell phone to chat with family.  Patient is confused, RN found her chewing on her oxygen monitor after her daughter left.  Daughter reports this is not patient's baseline, patient normally cares for self and spouse at home.  Dr. Verdell Carmine explained to daughter that more testing will be done tomorrow to determine the cause of her low sodium.  Phillis Knack, RN

## 2019-02-06 NOTE — Consult Note (Signed)
[place] for Sodium Monitoring Patient is on 3% sodium chloride continuous infusion. Follow up NA levels PRN with provider.  Recent Labs: Potassium (mmol/L)  Date Value  02/05/2019 3.9   Magnesium (mg/dL)  Date Value  02/06/2019 1.7   Calcium (mg/dL)  Date Value  02/05/2019 8.5 (L)   Phosphorus (mg/dL)  Date Value  02/06/2019 2.1 (L)   Sodium (mmol/L)  Date Value  02/06/2019 122 (L)     Assessment: Patient was started on hypertonic saline as a result of hyponatremia suspicious for SIADH.   Goal of Therapy:  Sodium WNL.  If Na increase > 4 mEq in 2 hours or > 6 mEq/L in 4 hours contact pharmacy.   Plan:   7/26 ~ 07:00 Na = 122.  Sodium Chloride 3% infusion at 35 ml/hr.   Na level ordered every 4 hours.     Olivia Canter [facility] Clinical Pharmacist 02/06/2019 8:10 AM

## 2019-02-06 NOTE — Consult Note (Signed)
[place] for Sodium Monitoring Patient is on 3% sodium chloride continuous infusion. Follow up NA levels PRN with provider.  Recent Labs: Potassium (mmol/L)  Date Value  02/05/2019 3.9   Magnesium (mg/dL)  Date Value  02/06/2019 1.7   Calcium (mg/dL)  Date Value  02/05/2019 8.5 (L)   Phosphorus (mg/dL)  Date Value  02/06/2019 2.1 (L)   Sodium (mmol/L)  Date Value  02/06/2019 124 (L)     Assessment: Patient was started on hypertonic saline as a result of hyponatremia suspicious for SIADH.   7/25 @ 1417: Na: 117 7/25 @ 1814: Na 117 7/25 @ 2027: Na 118 - Infusion increased to 35 mL/hr  7/26 @ 0120: Na 120 7/26 @ 0539: Na 133 - provider notified by pharmacist to stop infusion & recheck sodium level  7/26 @ 0657: Na  122 7/26 @1318 : NA 124 7/26 @ 1735: Na 124   Goal of Therapy:  Sodium WNL.  If Na increase > 4 mEq in 2 hours or > 6 mEq/L in 4 hours contact pharmacy.   Plan:  Sodium Chloride 3% infusion at 35 ml/hr.   Na level ordered every 4 hours.     Rowland Lathe, [facility] Clinical Pharmacist 02/06/2019 4:25 PM

## 2019-02-06 NOTE — Consult Note (Signed)
[place] for Sodium Monitoring Patient is on 3% sodium chloride continuous infusion. Follow up NA levels PRN with provider.  Recent Labs: Potassium (mmol/L)  Date Value  02/05/2019 3.9   Magnesium (mg/dL)  Date Value  02/06/2019 1.7   Calcium (mg/dL)  Date Value  02/05/2019 8.5 (L)   Phosphorus (mg/dL)  Date Value  02/06/2019 2.1 (L)   Sodium (mmol/L)  Date Value  02/06/2019 126 (L)     Assessment: Patient was started on hypertonic saline as a result of hyponatremia suspicious for SIADH.   7/25 @ 1417: Na: 117 7/25 @ 1814: Na 117 7/25 @ 2027: Na 118 - Infusion increased to 35 mL/hr  7/26 @ 0120: Na 120 7/26 @ 0539: Na 133 - provider notified by pharmacist to stop infusion & recheck sodium level  7/26 @ 0657: Na  122 7/26 @1318 : NA 124 7/26 @ 1735: Na 124 7/26 @ 2139: Na 126   Goal of Therapy:  Sodium WNL.  If Na increase > 4 mEq in 2 hours or > 6 mEq/L in 4 hours contact pharmacy.   Plan:  Sodium Chloride 3% infusion at 35 ml/hr.   Na level ordered every 4 hours.     Rowland Lathe, [facility] Clinical Pharmacist 02/06/2019 10:12 PM

## 2019-02-06 NOTE — Progress Notes (Signed)
Pt stated she wanted off bipap, no shortness of breath noted. Placed on 3lpm [place], sats 96%, respiratory rate 16/min. Tolerating well at this time.

## 2019-02-06 NOTE — Progress Notes (Signed)
CRITICAL CARE NOTE      CHIEF COMPLAINT:   Confusion with severe hyponatremia   SUBJECTIVE     Patient is clinically improved.  Her Na was overcorrected overnight with subsequent pause of 3NS then back to acceptable meq correction. She is able speak in full sentences appropriately , relates some weakness.   PAST MEDICAL HISTORY   Past Medical History:  Diagnosis Date  . Diabetes mellitus without complication ([place])   . Hypertension      SURGICAL HISTORY   Past Surgical History:  Procedure Laterality Date  . ABDOMINAL HYSTERECTOMY    . CHOLECYSTECTOMY       FAMILY HISTORY   History reviewed. No pertinent family history.   SOCIAL HISTORY   Social History   Tobacco Use  . Smoking status: Former Research scientist (life sciences)  . Smokeless tobacco: Never Used  Substance Use Topics  . Alcohol use: Not Currently  . Drug use: Not Currently     MEDICATIONS   Current Medication:  Current Facility-Administered Medications:  .  atenolol (TENORMIN) tablet 50 mg, 50 mg, Oral, Daily, Ojie, Jude, MD, 50 mg at 02/05/19 0803 .  enalapril (VASOTEC) tablet 20 mg, 20 mg, Oral, BID, Ojie, Jude, MD, 20 mg at 02/05/19 2211 .  enoxaparin (LOVENOX) injection 40 mg, 40 mg, Subcutaneous, Q24H, Sainani, Belia Heman, MD, 40 mg at 02/05/19 2205 .  hydrALAZINE (APRESOLINE) injection 10 mg, 10 mg, Intravenous, Q4H PRN, Lance Coon, MD, 10 mg at 02/05/19 2318 .  insulin aspart (novoLOG) injection 0-5 Units, 0-5 Units, Subcutaneous, QHS, Lance Coon, MD .  insulin aspart (novoLOG) injection 0-9 Units, 0-9 Units, Subcutaneous, TID WC, Lance Coon, MD, 1 Units at 02/04/19 1713 .  ondansetron (ZOFRAN) tablet 4 mg, 4 mg, Oral, Q6H PRN **OR** ondansetron (ZOFRAN) injection 4 mg, 4 mg, Intravenous, Q6H PRN, Lance Coon, MD .  pantoprazole  (PROTONIX) EC tablet 40 mg, 40 mg, Oral, Daily, Lance Coon, MD, 40 mg at 02/05/19 0803 .  traZODone (DESYREL) tablet 50 mg, 50 mg, Oral, QHS PRN, Lance Coon, MD, 50 mg at 02/05/19 2206    ALLERGIES   Codeine, Atorvastatin, Citalopram, Keflex [cephalexin], and Tylenol [acetaminophen]    REVIEW OF SYSTEMS    Unable to obtain due to confusion  PHYSICAL EXAMINATION   Vitals:   02/06/19 0000 02/06/19 0600  BP: (!) 126/55 (!) 135/57  Pulse: 76 73  Resp: 17 (!) 21  Temp: 97.6 F (36.4 C)   SpO2: 96% 99%    GENERAL:nad HEAD: Normocephalic, atraumatic.  EYES: Pupils equal, round, reactive to light.  No scleral icterus.  MOUTH: Moist mucosal membrane. NECK: Supple. No thyromegaly. No nodules. No JVD.  PULMONARY: decreased bs at right lung CARDIOVASCULAR: S1 and S2. Regular rate and rhythm. No murmurs, rubs, or gallops.  GASTROINTESTINAL: Soft, nontender, non-distended. No masses. Positive bowel sounds. No hepatosplenomegaly.  MUSCULOSKELETAL: No swelling, clubbing, or edema.  NEUROLOGIC: Mild distress due to acute illness SKIN:intact,warm,dry   LABS AND IMAGING       LAB RESULTS:     Recent Labs  Lab 02/03/19 1804  02/04/19 0255  02/05/19 0556  02/05/19 2027 02/06/19 0120 02/06/19 0539  NA 113*   < > 118*   < > 117*   < > 118* 120* 133*  K 3.4*  --  3.5  --  3.9  --   --   --   --   CL 74*  --  79*  --  79*  --   --   --   --  CO2 27  --  30  --  30  --   --   --   --   BUN 7*  --  7*  --  7*  --   --   --   --   CREATININE 0.43*  --  0.48  --  0.39*  --   --   --   --   GLUCOSE 135*  --  126*  --  119*  --   --   --   --    < > = values in this interval not displayed.   Recent Labs  Lab 02/04/19 0255 02/05/19 0556 02/06/19 0539  HGB 11.7* 12.6 12.0  HCT 36.1 39.3 37.5  WBC 6.1 5.9 6.9  PLT 236 266 264     IMAGING RESULTS: Dg Chest Port 1 View  Result Date: 02/05/2019 CLINICAL DATA:  PICC line placement EXAM: PORTABLE CHEST 1 VIEW  COMPARISON:  Portable exam 2225 hours compared to 1631 hours FINDINGS: RIGHT arm PICC line with tip projecting over superior RIGHT atrium. May consider withdrawing catheter 2.5-3.0 cm to place tip at cavoatrial junction. Stable heart size and mediastinal contours. RIGHT pleural effusion and basilar atelectasis with questionable RIGHT perihilar infiltrate. Minimal LEFT base atelectasis. IMPRESSION: Tip of RIGHT arm PICC line projects over RIGHT atrium; recommend withdrawal 2.5 x 3.0 cm to place tip at approximately the cavoatrial junction. Otherwise no interval change. Electronically Signed   By: Lavonia Dana M.D.   On: 02/05/2019 22:34   Dg Chest Port 1 View  Result Date: 02/05/2019 CLINICAL DATA:  PICC line placement EXAM: PORTABLE CHEST 1 VIEW COMPARISON:  Portable exam 1613 hours compared to 02/03/2019 FINDINGS: RIGHT arm PICC line tip deflects into the azygos arch. Upper normal heart size. Mediastinal contours and pulmonary vascularity normal. Atherosclerotic calcification aorta. RIGHT lung infiltrate with small RIGHT pleural effusion and basilar atelectasis. LEFT lung clear. No pneumothorax or acute osseous findings. IMPRESSION: Tip of RIGHT arm PICC line deflects into the azygos arch, recommend repositioning into SVC. Increased RIGHT lung infiltrates, RIGHT pleural effusion and basilar atelectasis. Findings called to [facility] in ICU on 02/05/2019 at 1839 hrs. Electronically Signed   By: Lavonia Dana M.D.   On: 02/05/2019 18:40   [place] Ekg Site Rite  Result Date: 02/05/2019 If Site Rite image not attached, placement could not be confirmed due to current cardiac rhythm.     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 vs malignancy due to finding of Pleural  effusion and hilar lymphadenopathy in context of fatigue and weight loss -Na monitoring closely    Right pleural effusion - moderate  - will perform bedside thoracentesis and evaluate for cytology (concern for malignancy) as well as typical microbiology workup   Neurology - mild confusion due to severe hyponatremia - will monitor closely, treating undelying cause    GI/Nutrition GI PROPHYLAXIS as indicated DIET-->TF's as tolerated Constipation protocol as indicated  ENDO - ICU hypoglycemic\Hyperglycemia protocol -check FSBS per protocol   ELECTROLYTES -follow labs as needed -replace as needed -pharmacy consultation   DVT/GI PRX ordered -SCDs  TRANSFUSIONS AS NEEDED MONITOR FSBS ASSESS the need for LABS as needed   Critical care provider statement:    Critical care time (minutes):  35  Critical care time was exclusive of:  Separately billable procedures and treating other patients   Critical care was necessary to treat or prevent imminent or life-threatening deterioration of the following conditions:  Altered mental status, severe hyponatremia, DM, Right pleural effusion, multiple comorbid conditions.    Critical care was time spent personally by me on the following activities:  Development of treatment plan with patient or surrogate, discussions with consultants, evaluation of patient's response to treatment, examination of patient, obtaining history from patient or surrogate, ordering and performing treatments and interventions, ordering and review of laboratory studies and re-evaluation of patient's condition.  I assumed direction of critical care for this patient from another provider in my specialty: no    This document was prepared using Dragon voice recognition software and may include unintentional dictation errors.    Ottie Glazier, M.D.  Division of [place]

## 2019-02-06 NOTE — Progress Notes (Signed)
[facility] [place] NAME: Gracilyn Gunia    MR#:  [id number]  DATE OF BIRTH:  [birth year]  SUBJECTIVE:   Transferred to the ICU yesterday to be started on hypertonic saline.  Sodium is improving.  Patient's daughter is at bedside.  She appears to have some mild shortness of breath.  REVIEW OF SYSTEMS:    Review of Systems  Constitutional: Negative for chills and fever.  HENT: Negative for congestion and tinnitus.   Eyes: Negative for blurred vision and double vision.  Respiratory: Positive for shortness of breath. Negative for cough and wheezing.   Cardiovascular: Negative for chest pain, orthopnea and PND.  Gastrointestinal: Negative for abdominal pain, diarrhea, nausea and vomiting.  Genitourinary: Negative for dysuria and hematuria.  Neurological: Positive for weakness (generalized. ). Negative for dizziness, sensory change and focal weakness.  All other systems reviewed and are negative.   Nutrition: Heart healthy/carb modified Tolerating Diet: yes Tolerating PT: Await Eval.    DRUG ALLERGIES:   Allergies  Allergen Reactions  . Codeine Hives  . Atorvastatin Other (See Comments)  . Citalopram Other (See Comments)    Nausea  . Keflex [Cephalexin] Diarrhea  . Tylenol [Acetaminophen] Hives    VITALS:  Blood pressure 138/63, pulse 84, temperature 97.7 F (36.5 C), temperature source Axillary, resp. rate 17, height 5\' 4"  (1.626 m), weight 108 kg, SpO2 98 %.  PHYSICAL EXAMINATION:   Physical Exam  GENERAL:  78 y.o.-year-old patient lying in bed lethargic but follows simple commands.  EYES: Pupils equal, round, reactive to light and accommodation. No scleral icterus. Extraocular muscles intact.  HEENT: Head atraumatic, normocephalic. Oropharynx and nasopharynx clear.  NECK:  Supple, no jugular venous distention. No thyroid enlargement, no tenderness.  LUNGS: Poor A/E on right lung fields,  no wheezing, rales, rhonchi. No use  of accessory muscles of respiration.  CARDIOVASCULAR: S1, S2 normal. No murmurs, rubs, or gallops.  ABDOMEN: Soft, nontender, nondistended. Bowel sounds present. No organomegaly or mass.  EXTREMITIES: No cyanosis, clubbing or edema b/l.    NEUROLOGIC: Cranial nerves II through XII are intact. No focal Motor or sensory deficits b/l. Globally weak.   PSYCHIATRIC: The patient is alert and oriented x 2.  SKIN: No obvious rash, lesion, or ulcer.    LABORATORY PANEL:   CBC Recent Labs  Lab 02/06/19 0539  WBC 6.9  HGB 12.0  HCT 37.5  PLT 264   ------------------------------------------------------------------------------------------------------------------  Chemistries  Recent Labs  Lab 02/05/19 0556  02/06/19 0539 02/06/19 0657  NA 117*   < > 133* 122*  K 3.9  --   --   --   CL 79*  --   --   --   CO2 30  --   --   --   GLUCOSE 119*  --   --   --   BUN 7*  --   --   --   CREATININE 0.39*  --   --   --   CALCIUM 8.5*  --   --   --   MG 1.7  --  1.7  --    < > = values in this interval not displayed.   ------------------------------------------------------------------------------------------------------------------  Cardiac Enzymes No results for input(s): TROPONINI in the last 168 hours. ------------------------------------------------------------------------------------------------------------------  RADIOLOGY:  Ct Chest Wo Contrast  Result Date: 02/04/2019 CLINICAL DATA:  Shortness of breath EXAM: CT CHEST WITHOUT CONTRAST TECHNIQUE: Multidetector CT imaging of the chest was performed following  the standard protocol without IV contrast. COMPARISON:  Plain film from previous day FINDINGS: Cardiovascular: Somewhat limited due to lack of IV contrast. Aortic calcifications are noted without significant aneurysmal dilatation. No cardiac enlargement is seen. No pericardial effusion is noted. Mediastinum/Nodes: The esophagus is within normal limits. No definitive hilar adenopathy  is noted. There is a right paratracheal node which measures approximately 16 mm in short axis as well as a subcarinal node which measures 17 mm in short axis. These are likely reactive in nature. Thoracic inlet is within normal limits. Lungs/Pleura: The left lung is well aerated with minimal atelectatic changes in the lingula along the cardiac border. No sizable effusion is noted. The right hemithorax demonstrates a moderate to large pleural effusion increased from the prior exam. Underlying patchy infiltrate is noted throughout the right lung with more marked consolidation in the right lung base. No definitive nodule is noted. Upper Abdomen: Somewhat limited due to lack of IV contrast. No definitive abnormality is noted. Musculoskeletal: Mild degenerative change of the thoracic spine is noted. No acute bony abnormality is seen. IMPRESSION: Moderate to large right-sided pleural effusion with associated consolidation most marked in the right lower lobe. Mild left basilar atelectasis. Prominent mediastinal lymph nodes likely reactive in nature. Aortic Atherosclerosis (ICD10-I70.0). Electronically Signed   By: Inez Catalina M.D.   On: 02/04/2019 15:36   Dg Chest Port 1 View  Result Date: 02/05/2019 CLINICAL DATA:  PICC line placement EXAM: PORTABLE CHEST 1 VIEW COMPARISON:  Portable exam 2225 hours compared to 1631 hours FINDINGS: RIGHT arm PICC line with tip projecting over superior RIGHT atrium. May consider withdrawing catheter 2.5-3.0 cm to place tip at cavoatrial junction. Stable heart size and mediastinal contours. RIGHT pleural effusion and basilar atelectasis with questionable RIGHT perihilar infiltrate. Minimal LEFT base atelectasis. IMPRESSION: Tip of RIGHT arm PICC line projects over RIGHT atrium; recommend withdrawal 2.5 x 3.0 cm to place tip at approximately the cavoatrial junction. Otherwise no interval change. Electronically Signed   By: Lavonia Dana M.D.   On: 02/05/2019 22:34   Dg Chest Port 1 View   Result Date: 02/05/2019 CLINICAL DATA:  PICC line placement EXAM: PORTABLE CHEST 1 VIEW COMPARISON:  Portable exam 1613 hours compared to 02/03/2019 FINDINGS: RIGHT arm PICC line tip deflects into the azygos arch. Upper normal heart size. Mediastinal contours and pulmonary vascularity normal. Atherosclerotic calcification aorta. RIGHT lung infiltrate with small RIGHT pleural effusion and basilar atelectasis. LEFT lung clear. No pneumothorax or acute osseous findings. IMPRESSION: Tip of RIGHT arm PICC line deflects into the azygos arch, recommend repositioning into SVC. Increased RIGHT lung infiltrates, RIGHT pleural effusion and basilar atelectasis. Findings called to [facility] in ICU on 02/05/2019 at 1839 hrs. Electronically Signed   By: Lavonia Dana M.D.   On: 02/05/2019 18:40   [place] Ekg Site Rite  Result Date: 02/05/2019 If Site Rite image not attached, placement could not be confirmed due to current cardiac rhythm.    ASSESSMENT AND PLAN:   78 year old female with past medical history of diabetes, hypertension who presents to the hospital due to shortness of breath and weakness and noted to be severely hyponatremic.  1.  Acute respiratory failure with hypoxia-secondary to a right-sided pleural effusion. -Patient underwent CT of the chest which showed a moderate to large right-sided pleural effusion with associated consolidation. -Plan for ultrasound-guided diagnostic/therapeutic thoracentesis tomorrow.  Continue O2 supplementation for now.  2.  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 -Nephrology following and started on hypertonic saline yesterday.  Sodium  is slowly improving.  Patient sodium overcorrected and was noted to be 133 that is likely a lab error and sodium now is 122 and continue further care as per nephrology. -Continue to hold diuretics for now.  3. Essential HTN - cont. Atenolol, Vasotec.  - BP stable.   4. DM Type II w/out complication - cont. SSI and follow BS  5. GERD - cont.  Protonix.   Discussed plan of care with pt's daughter at bedside.   All the records are reviewed and case discussed with Care Management/Social Worker. Management plans discussed with the patient, family and they are in agreement.  CODE STATUS: Full code  DVT Prophylaxis: Lovenox  TOTAL TIME TAKING CARE OF THIS PATIENT: 30 minutes.   POSSIBLE D/C IN 3-4 DAYS, DEPENDING ON CLINICAL CONDITION.   Henreitta Leber M.D on 02/06/2019 at 1:47 PM  Between 7am to 6pm - Pager - [phone]  After 6pm go to [email or web address] - Proofreader  [facility]  Hospitalists  Office  [phone]  CC: Primary care physician; Sallee Lange, NP

## 2019-02-06 NOTE — Consult Note (Signed)
[facility] for Sodium Monitoring Patient is on 3% sodium chloride continuous infusion. Follow up NA levels PRN with provider.  Recent Labs: Potassium (mmol/L)  Date Value  02/05/2019 3.9   Magnesium (mg/dL)  Date Value  02/06/2019 1.7   Calcium (mg/dL)  Date Value  02/05/2019 8.5 (L)   Phosphorus (mg/dL)  Date Value  02/06/2019 2.1 (L)   Sodium (mmol/L)  Date Value  02/06/2019 133 (L)     Assessment: Patient was started on hypertonic saline as a result of hyponatremia suspicious for SIADH.   Patient started on  sodium chloride 3% @ 30 mL/hr on 7/25 @ 1651  7/25 @ 1417: Na: 117 7/25 @ 1814: Na 117 7/25 @ 2027: Na 118 - Infusion increased to 35 mL/hr  7/26 @ 0120: Na 120  Goal of Therapy:  Sodium WNL.   If Na increase > 4 mEq in 2 hours or > 6 mEq/L in 4 hours contact pharmacy.   Plan:  7/26 @ 0059 :Na 133 - Level increase >10 mEq in 4 hours. Pharmacy contacted provider for  discontinuation of sodium chloride 3%.   Repeat stat lab ordered.   Pernell Dupre, PharmD, BCPS Clinical Pharmacist 02/06/2019 6:35 AM

## 2019-02-07 ENCOUNTER — Inpatient Hospital Stay: Payer: Medicare Other

## 2019-02-07 LAB — GLUCOSE, CAPILLARY
Glucose-Capillary: 101 mg/dL — ABNORMAL HIGH (ref 70–99)
Glucose-Capillary: 115 mg/dL — ABNORMAL HIGH (ref 70–99)
Glucose-Capillary: 112 mg/dL — ABNORMAL HIGH (ref 70–99)
Glucose-Capillary: 118 mg/dL — ABNORMAL HIGH (ref 70–99)

## 2019-02-07 LAB — BODY FLUID CELL COUNT WITH DIFFERENTIAL
Eos, Fluid: 0 %
Lymphs, Fluid: 71 %
Monocyte-Macrophage-Serous Fluid: 16 %
Neutrophil Count, Fluid: 13 %
Other Cells, Fluid: 0 %
Total Nucleated Cell Count, Fluid: 758 cu mm

## 2019-02-07 LAB — CBC WITH DIFFERENTIAL/PLATELET
Abs Immature Granulocytes: 0.11 10*3/uL — ABNORMAL HIGH (ref 0.00–0.07)
Basophils Absolute: 0 10*3/uL (ref 0.0–0.1)
Basophils Relative: 0 %
Eosinophils Absolute: 0 10*3/uL (ref 0.0–0.5)
Eosinophils Relative: 0 %
HCT: 39.2 % (ref 36.0–46.0)
Hemoglobin: 12 g/dL (ref 12.0–15.0)
Immature Granulocytes: 2 %
Lymphocytes Relative: 10 %
Lymphs Abs: 0.7 10*3/uL (ref 0.7–4.0)
MCH: 24.6 pg — ABNORMAL LOW (ref 26.0–34.0)
MCHC: 30.6 g/dL (ref 30.0–36.0)
MCV: 80.5 fL (ref 80.0–100.0)
Monocytes Absolute: 0.9 10*3/uL (ref 0.1–1.0)
Monocytes Relative: 14 %
Neutro Abs: 4.6 10*3/uL (ref 1.7–7.7)
Neutrophils Relative %: 74 %
Platelets: 270 10*3/uL (ref 150–400)
RBC: 4.87 MIL/uL (ref 3.87–5.11)
RDW: 14 % (ref 11.5–15.5)
WBC: 6.3 10*3/uL (ref 4.0–10.5)
nRBC: 0 % (ref 0.0–0.2)

## 2019-02-07 LAB — SODIUM
Sodium: 128 mmol/L — ABNORMAL LOW (ref 135–145)
Sodium: 130 mmol/L — ABNORMAL LOW (ref 135–145)
Sodium: 140 mmol/L (ref 135–145)
Sodium: 132 mmol/L — ABNORMAL LOW (ref 135–145)
Sodium: 132 mmol/L — ABNORMAL LOW (ref 135–145)

## 2019-02-07 LAB — BASIC METABOLIC PANEL
Anion gap: 6 (ref 5–15)
BUN: 13 mg/dL (ref 8–23)
CO2: 30 mmol/L (ref 22–32)
Calcium: 9.2 mg/dL (ref 8.9–10.3)
Chloride: 94 mmol/L — ABNORMAL LOW (ref 98–111)
Creatinine, Ser: 0.5 mg/dL (ref 0.44–1.00)
GFR calc Af Amer: >60 mL/min (ref >60)
GFR calc non Af Amer: >60 mL/min (ref >60)
Glucose, Bld: 129 mg/dL — ABNORMAL HIGH (ref 70–99)
Potassium: 4.2 mmol/L (ref 3.5–5.1)
Sodium: 130 mmol/L — ABNORMAL LOW (ref 135–145)

## 2019-02-07 LAB — PROTEIN, PLEURAL OR PERITONEAL FLUID: Total protein, fluid: <3 g/dL

## 2019-02-07 LAB — PHOSPHORUS: Phosphorus: 2.4 mg/dL — ABNORMAL LOW (ref 2.5–4.6)

## 2019-02-07 LAB — GLUCOSE, PLEURAL OR PERITONEAL FLUID: Glucose, Fluid: 126 mg/dL

## 2019-02-07 LAB — MAGNESIUM: Magnesium: 2.1 mg/dL (ref 1.7–2.4)

## 2019-02-07 LAB — AMYLASE, PLEURAL OR PERITONEAL FLUID: Amylase, Fluid: 7 U/L

## 2019-02-07 LAB — LACTATE DEHYDROGENASE: LDH: 180 U/L (ref 98–192)

## 2019-02-07 MED ORDER — POTASSIUM PHOSPHATE MONOBASIC 500 MG PO TABS
500.0000 mg | ORAL_TABLET | Freq: Three times a day (TID) | ORAL | Status: AC
  Filled 2019-02-07 (×2): qty 1

## 2019-02-07 NOTE — Progress Notes (Signed)
Interesting day. Neuro checks every 4 hours- patient is either very talkative with clear annouciation or speech is garbled and incomprehensible. When she is alert she makes eye contact. Patient always follows commands. MAE when ask. Afebrile. Awaiting MRI .

## 2019-02-07 NOTE — Progress Notes (Signed)
[facility]  ROUNDING NOTE   Subjective:   Na 130  Right sided ultrasound guided thoracentesis: 1.2 liters removed.   Confused.    Objective:  Vital signs in last 24 hours:  Temp:  [97.4 F (36.3 C)-97.7 F (36.5 C)] 97.7 F (36.5 C) (07/27 0800) Pulse Rate:  [64-70] 69 (07/27 1000) Resp:  [12] 12 (07/27 0600) BP: (114-135)/(50-61) 114/61 (07/27 1045) SpO2:  [95 %-98 %] 98 % (07/27 1000)  Weight change:  Filed Weights   02/03/19 1729 02/05/19 1414  Weight: 96.6 kg 108 kg    Intake/Output: I/O last 3 completed shifts: In: 770.5 [P.O.:240; I.V.:530.5] Out: 2195 [Urine:2195]   Intake/Output this shift:  Total I/O In: -  Out: 125 [Urine:125]  Physical Exam: General: No acute distress, laying in bed  Head: Normocephalic, atraumatic. Moist oral mucosal membranes  Eyes: Anicteric  Neck: Supple, trachea midline  Lungs:  Clear to auscultation, normal effort  Heart: S1S2 no rubs  Abdomen:  Soft, nontender, bowel sounds present  Extremities: Trace peripheral edema.  Neurologic: Awake, alert, following commands  Skin: No lesions       Basic Metabolic Panel: Recent Labs  Lab 02/03/19 1804  02/04/19 0255  02/05/19 0556  02/06/19 0539  02/06/19 1717 02/06/19 2139 02/07/19 0135 02/07/19 0454 02/07/19 1034  NA 113*   < > 118*   < > 117*   < > 133*   < > 124* 126* 128* 130* 130*  K 3.4*  --  3.5  --  3.9  --   --   --   --   --   --  4.2  --   CL 74*  --  79*  --  79*  --   --   --   --   --   --  94*  --   CO2 27  --  30  --  30  --   --   --   --   --   --  30  --   GLUCOSE 135*  --  126*  --  119*  --   --   --   --   --   --  129*  --   BUN 7*  --  7*  --  7*  --   --   --   --   --   --  13  --   CREATININE 0.43*  --  0.48  --  0.39*  --   --   --   --   --   --  0.50  --   CALCIUM 8.9  --  8.7*  --  8.5*  --   --   --   --   --   --  9.2  --   MG  --   --   --   --  1.7  --  1.7  --   --   --   --  2.1  --   PHOS  --   --   --   --   --   --   2.1*  --   --   --   --  2.4*  --    < > = values in this interval not displayed.    Liver Function Tests: No results for input(s): AST, ALT, ALKPHOS, BILITOT, PROT, ALBUMIN in the last 168 hours. No results for input(s): LIPASE, AMYLASE in the last 168 hours. No results  for input(s): AMMONIA in the last 168 hours.  CBC: Recent Labs  Lab 02/03/19 1804 02/04/19 0255 02/05/19 0556 02/06/19 0539 02/07/19 0454  WBC 6.2 6.1 5.9 6.9 6.3  NEUTROABS 4.5  --   --   --  4.6  HGB 12.2 11.7* 12.6 12.0 12.0  HCT 36.6 36.1 39.3 37.5 39.2  MCV 74.4* 75.5* 75.7* 77.2* 80.5  PLT 274 236 266 264 270    Cardiac Enzymes: No results for input(s): CKTOTAL, CKMB, CKMBINDEX, TROPONINI in the last 168 hours.  BNP: Invalid input(s): POCBNP  CBG: Recent Labs  Lab 02/06/19 1212 02/06/19 1616 02/06/19 2101 02/07/19 0715 02/07/19 1156  GLUCAP 142* 113* 112* 101* 115*    Microbiology: Results for orders placed or performed during the hospital encounter of 02/03/19  SARS Coronavirus 2 (CEPHEID- Performed in [place] hospital lab), Hosp Order     Status: None   Collection Time: 02/03/19  6:55 PM   Specimen: Nasopharyngeal Swab  Result Value Ref Range Status   SARS Coronavirus 2 NEGATIVE NEGATIVE Final    Comment: (NOTE) If result is NEGATIVE SARS-CoV-2 target nucleic acids are NOT DETECTED. The SARS-CoV-2 RNA is generally detectable in upper and lower  respiratory specimens during the acute phase of infection. The lowest  concentration of SARS-CoV-2 viral copies this assay can detect is 250  copies / mL. A negative result does not preclude SARS-CoV-2 infection  and should not be used as the sole basis for treatment or other  patient management decisions.  A negative result may occur with  improper specimen collection / handling, submission of specimen other  than nasopharyngeal swab, presence of viral mutation(s) within the  areas targeted by this assay, and inadequate number of viral  copies  (<250 copies / mL). A negative result must be combined with clinical  observations, patient history, and epidemiological information. If result is POSITIVE SARS-CoV-2 target nucleic acids are DETECTED. The SARS-CoV-2 RNA is generally detectable in upper and lower  respiratory specimens dur ing the acute phase of infection.  Positive  results are indicative of active infection with SARS-CoV-2.  Clinical  correlation with patient history and other diagnostic information is  necessary to determine patient infection status.  Positive results do  not rule out bacterial infection or co-infection with other viruses. If result is PRESUMPTIVE POSTIVE SARS-CoV-2 nucleic acids MAY BE PRESENT.   A presumptive positive result was obtained on the submitted specimen  and confirmed on repeat testing.  While 2019 novel coronavirus  (SARS-CoV-2) nucleic acids may be present in the submitted sample  additional confirmatory testing may be necessary for epidemiological  and / or clinical management purposes  to differentiate between  SARS-CoV-2 and other Sarbecovirus currently known to infect humans.  If clinically indicated additional testing with an alternate test  methodology [phone]) is advised. The SARS-CoV-2 RNA is generally  detectable in upper and lower respiratory sp ecimens during the acute  phase of infection. The expected result is Negative. Fact Sheet for Patients:  StrictlyIdeas.no Fact Sheet for Healthcare Providers: BankingDealers.co.za This test is not yet approved or cleared by the [place] FDA and has been authorized for detection and/or diagnosis of SARS-CoV-2 by FDA under an Emergency Use Authorization (EUA).  This EUA will remain in effect (meaning this test can be used) for the duration of the COVID-19 declaration under Section 564(b)(1) of the Act, 21 U.S.C. section 360bbb-3(b)(1), unless the authorization is terminated  or revoked sooner. Performed at [facility], [address] [place], [place] [address]   MRSA PCR Screening     Status: None   Collection Time: 02/05/19  2:17 PM   Specimen: Nasopharyngeal  Result Value Ref Range Status   MRSA by PCR NEGATIVE NEGATIVE Final    Comment:        The GeneXpert MRSA Assay (FDA approved for NASAL specimens only), is one component of a comprehensive MRSA colonization surveillance program. It is not intended to diagnose MRSA infection nor to guide or monitor treatment for MRSA infections. Performed at [facility], [address] [place], [place] [address]     Coagulation Studies: No results for input(s): LABPROT, INR in the last 72 hours.  Urinalysis: No results for input(s): COLORURINE, LABSPEC, PHURINE, GLUCOSEU, HGBUR, BILIRUBINUR, KETONESUR, PROTEINUR, UROBILINOGEN, NITRITE, LEUKOCYTESUR in the last 72 hours.  Invalid input(s): APPERANCEUR    Imaging: Dg Chest Port 1 View  Result Date: 02/07/2019 CLINICAL DATA:  Status post right-sided thoracentesis. EXAM: PORTABLE CHEST 1 VIEW COMPARISON:  02/05/2019 FINDINGS: The heart is enlarged but stable. Stable tortuosity and calcification of the thoracic aorta. The right PICC line is stable. Status post right-sided thoracentesis with interval decrease in size of the right pleural effusion. No postprocedural pneumothorax is identified. Small left pleural effusion and bibasilar atelectasis. IMPRESSION: Status post right-sided thoracentesis with near complete evacuation of the right-sided pleural effusion. No postprocedural pneumothorax. Persistent small left effusion and moderate bibasilar atelectasis. Electronically Signed   By: Marijo Sanes M.D.   On: 02/07/2019 10:33   Dg Chest Port 1 View  Result Date: 02/07/2019 CLINICAL DATA:  Shortness of breath. Patient experiencing increased shortness of breath. EXAM: PORTABLE CHEST 1 VIEW COMPARISON:  Portable chest radiograph 02/05/2019  FINDINGS: Unchanged position of a right-sided PICC with tip projecting over the upper right atrium. May consider withdrawing catheter 2.5-3 cm to place tip cavoatrial junction. The cardiomediastinal silhouette is unchanged. Interval increase in size of a right pleural effusion with increasing right basilar atelectasis. Underlying right basilar pneumonia cannot be excluded. Ill-defined opacity throughout the mid to lower right lung has also increased and may reflect incomplete atelectasis and/or developing pneumonia. Mild left basilar atelectasis is unchanged. No evidence of pneumothorax. Degenerative changes of the spine. IMPRESSION: Unchanged position of a right PICC with tip projecting over the right atrium. Consider withdrawing catheter 2.5-3 cm to place tip at cavoatrial junction. Interval increase in size of a right pleural effusion with increasing right basilar atelectasis. Underlying right basilar pneumonia cannot be excluded. Interval increase in ill-defined opacity throughout the mid to lower right lung may reflect incomplete atelectasis and/or developing pneumonia. Electronically Signed   By: Kellie Simmering   On: 02/07/2019 08:14   Dg Chest Port 1 View  Result Date: 02/05/2019 CLINICAL DATA:  PICC line placement EXAM: PORTABLE CHEST 1 VIEW COMPARISON:  Portable exam 2225 hours compared to 1631 hours FINDINGS: RIGHT arm PICC line with tip projecting over superior RIGHT atrium. May consider withdrawing catheter 2.5-3.0 cm to place tip at cavoatrial junction. Stable heart size and mediastinal contours. RIGHT pleural effusion and basilar atelectasis with questionable RIGHT perihilar infiltrate. Minimal LEFT base atelectasis. IMPRESSION: Tip of RIGHT arm PICC line projects over RIGHT atrium; recommend withdrawal 2.5 x 3.0 cm to place tip at approximately the cavoatrial junction. Otherwise no interval change. Electronically Signed   By: Lavonia Dana M.D.   On: 02/05/2019 22:34   Dg Chest Port 1 View  Result  Date: 02/05/2019 CLINICAL DATA:  PICC line placement EXAM: PORTABLE CHEST 1  VIEW COMPARISON:  Portable exam 1613 hours compared to 02/03/2019 FINDINGS: RIGHT arm PICC line tip deflects into the azygos arch. Upper normal heart size. Mediastinal contours and pulmonary vascularity normal. Atherosclerotic calcification aorta. RIGHT lung infiltrate with small RIGHT pleural effusion and basilar atelectasis. LEFT lung clear. No pneumothorax or acute osseous findings. IMPRESSION: Tip of RIGHT arm PICC line deflects into the azygos arch, recommend repositioning into SVC. Increased RIGHT lung infiltrates, RIGHT pleural effusion and basilar atelectasis. Findings called to [facility] in ICU on 02/05/2019 at 1839 hrs. Electronically Signed   By: Lavonia Dana M.D.   On: 02/05/2019 18:40   US Thoracentesis Asp Pleural Space W/img Guide  Result Date: 02/07/2019 INDICATION: Right pleural effusion, hypertension, diabetes EXAM: ULTRASOUND GUIDED RIGHT THORACENTESIS MEDICATIONS: 1% lidocaine local COMPLICATIONS: None immediate. PROCEDURE: An ultrasound guided thoracentesis was thoroughly discussed with the patient and questions answered. The benefits, risks, alternatives and complications were also discussed. The patient understands and wishes to proceed with the procedure. Written consent was obtained. Ultrasound was performed to localize and mark an adequate pocket of fluid in the right chest. The area was then prepped and draped in the normal sterile fashion. 1% Lidocaine was used for local anesthesia. Under ultrasound guidance a 6 Fr Safe-T-Centesis catheter was introduced. Thoracentesis was performed. The catheter was removed and a dressing applied. FINDINGS: A total of approximately 1.2 L of amber colored pleural fluid was removed. Samples were sent to the laboratory as requested by the clinical team. IMPRESSION: Successful ultrasound guided right thoracentesis yielding 1.2 L of pleural fluid. Electronically Signed   By: Jerilynn Mages.   Shick M.D.   On: 02/07/2019 10:10     Medications:    . atenolol  50 mg Oral Daily  . enalapril  20 mg Oral BID  . enoxaparin (LOVENOX) injection  40 mg Subcutaneous Q24H  . insulin aspart  0-5 Units Subcutaneous QHS  . insulin aspart  0-9 Units Subcutaneous TID WC  . pantoprazole  40 mg Oral Daily  . potassium phosphate (monobasic)  500 mg Oral TID WC & HS   hydrALAZINE, ondansetron **OR** ondansetron (ZOFRAN) IV, traZODone  Assessment/ Plan:  Ms. Cathy Buck is a 78 y.o. black female with diabetes mellitus type 2, hypertension, hyperlipidemia, GERD, cervical myelopathy, lumbar stenosis, seasonal allergies, degenerative disc disease, vitamin D deficiency who was admitted to [facility] on 02/03/2019 for evaluation of significant shortness of breath and weakness.  She was found to have a large right pleural effusion with underlying consolidation as well.   1.  Hyponatremia, suspicious for SIADH. - status post hypertonic saline infusion.  - Continue serial sodium checks.  - Fluid restriction  2.  Hypertension: well controlled. Atenolol and enalapril.   3. Hypokalemia: improved with potassium supplements.   4.  Right-sided pleural effusion: status post right sided ultrasound guided thoracentesis on 7/27 yeilding 1.2 liters of fluid. Pathology pending. So far, labs are consistent with transudative effusion Concern for underlying lung mass.    LOS: [place] 7/27/20202:00 PM

## 2019-02-07 NOTE — Progress Notes (Signed)
Pharmacy Electrolyte Monitoring Consult:  Pharmacy consulted to assist in monitoring and replacing electrolytes in this 78 y.o. female admitted on 02/03/2019 with Shortness of Breath and Weakness  Patient initiated on 3% sodium chloride on 7/25.   Labs:  Sodium (mmol/L)  Date Value  02/07/2019 130 (L)   Potassium (mmol/L)  Date Value  02/07/2019 4.2   Magnesium (mg/dL)  Date Value  02/07/2019 2.1   Phosphorus (mg/dL)  Date Value  02/07/2019 2.4 (L)   Calcium (mg/dL)  Date Value  02/07/2019 9.2    Assessment/Plan: Patient with right thoracentesis with 1.2 L removed this am.   This am 3% sodium chloride infusing at 30mL/hr; sodium increased 105mmol in ~ 24hrs - 122 to 130. Per conversation with nephrology will decrease rate to 108mL/hr. Will continue to monitor with scheduled Q4hr sodiums.   Phosphorus minimally low, per rounds will order potassium phosphate 500mg  WM x 2 doses.   Will obtain CMP + Magnesium with am labs. If Na increase > 4 mEq in 2 hours or > 6 mEq/L in 4 hours contact pharmacy.   Will replace to maintain electrolytes within normal limits.   Pharmacy will continue to monitor and adjust per consult.   Denyse Fillion L 02/07/2019 11:13 AM

## 2019-02-07 NOTE — Progress Notes (Signed)
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  She also had a moderate right pleural effusion.   Patient remains confused, is able to move all 4 extremities to verbal communication however is weak.  Of note she has intermittent periods where she is verbally communicative and other times where she is more confused with decreased speech.  Had FaceTime conference with multiple family members and POA daughter Roland Rack who was at bedside during family conference.  Discussed care plan and answered all questions.   She has had slowly incrementing sodium via 3 NS as per nephrology.  Patient is status post thoracentesis today with multiple fluid studies in process.     PAST MEDICAL HISTORY   Past Medical History:  Diagnosis Date  . Diabetes mellitus without complication ([place])   . Hypertension      SURGICAL HISTORY   Past Surgical History:  Procedure Laterality Date  . ABDOMINAL HYSTERECTOMY    . CHOLECYSTECTOMY       FAMILY HISTORY   History reviewed. No pertinent family history.   SOCIAL HISTORY   Social History   Tobacco Use  . Smoking status: Former Research scientist (life sciences)  . Smokeless tobacco: Never Used  Substance Use Topics  . Alcohol use: Not Currently  . Drug use: Not Currently     MEDICATIONS   Current Medication:  Current Facility-Administered Medications:  .  atenolol (TENORMIN) tablet 50 mg, 50 mg, Oral, Daily, Ojie, Jude, MD, 50 mg at 02/06/19 1011 .  enalapril (VASOTEC) tablet 20 mg, 20 mg, Oral, BID, Ojie, Jude, MD, 20 mg at 02/06/19 1011 .  enoxaparin (LOVENOX) injection 40 mg, 40  mg, Subcutaneous, Q24H, Sainani, Belia Heman, MD, 40 mg at 02/06/19 2132 .  hydrALAZINE (APRESOLINE) injection 10 mg, 10 mg, Intravenous, Q4H PRN, Lance Coon, MD, 10 mg at 02/05/19 2318 .  insulin aspart (novoLOG) injection 0-5 Units, 0-5 Units, Subcutaneous, QHS, Lance Coon, MD .  insulin aspart (novoLOG) injection 0-9 Units, 0-9 Units, Subcutaneous, TID WC, Lance Coon, MD, 1 Units at 02/04/19 1713 .  ondansetron (ZOFRAN) tablet 4 mg, 4 mg, Oral, Q6H PRN **OR** ondansetron (ZOFRAN) injection 4 mg, 4 mg, Intravenous, Q6H PRN, Lance Coon, MD .  pantoprazole (PROTONIX) EC tablet 40 mg, 40 mg, Oral, Daily, Lance Coon, MD, 40 mg at 02/06/19 1011 .  sodium chloride (hypertonic) 3 % solution, , Intravenous, Continuous, Lateef, Munsoor, MD, Last Rate: 35 mL/hr at 02/07/19 0243, 35 mL/hr at 02/07/19 0243 .  traZODone (DESYREL) tablet 50 mg, 50 mg, Oral, QHS PRN, Lance Coon, MD, 50 mg at 02/05/19 2206    ALLERGIES   Codeine, Atorvastatin, Citalopram, Keflex [cephalexin], and Tylenol [acetaminophen]    REVIEW OF SYSTEMS    Unable to obtain due to confusion  PHYSICAL EXAMINATION   Vitals:   02/07/19 0400 02/07/19 0600  BP:  (!) 128/56  Pulse:  64  Resp:  12  Temp: (!) 97.4 F (36.3 C)   SpO2:  95%    GENERAL:nad HEAD: Normocephalic, atraumatic.  EYES: Pupils equal, round, reactive to light.  No scleral icterus.  MOUTH: Moist mucosal membrane. NECK: Supple. No thyromegaly. No nodules. No JVD.  PULMONARY: decreased bs at right lung CARDIOVASCULAR: S1 and S2. Regular rate and rhythm. No murmurs, rubs, or gallops.  GASTROINTESTINAL: Soft, nontender,  non-distended. No masses. Positive bowel sounds. No hepatosplenomegaly.  MUSCULOSKELETAL: No swelling, clubbing, or edema.  NEUROLOGIC: Mild distress due to acute illness SKIN:intact,warm,dry   LABS AND IMAGING       LAB RESULTS:     Recent Labs  Lab 02/04/19 0255  02/05/19 0556  02/06/19 2139 02/07/19 0135  02/07/19 0454  NA 118*   < > 117*   < > 126* 128* 130*  K 3.5  --  3.9  --   --   --  4.2  CL 79*  --  79*  --   --   --  94*  CO2 30  --  30  --   --   --  30  BUN 7*  --  7*  --   --   --  13  CREATININE 0.48  --  0.39*  --   --   --  0.50  GLUCOSE 126*  --  119*  --   --   --  129*   < > = values in this interval not displayed.   Recent Labs  Lab 02/05/19 0556 02/06/19 0539 02/07/19 0454  HGB 12.6 12.0 12.0  HCT 39.3 37.5 39.2  WBC 5.9 6.9 6.3  PLT 266 264 270     IMAGING RESULTS: No results found.    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 vs malignancy due to finding of Pleural effusion and hilar lymphadenopathy in context of fatigue and weight loss -Na monitoring closely  -family meeting today, they will discuss goals of care    Right pleural effusion - moderate  - s/p thoracentesis with pleural fluid studies in process    Neurology - encephalopathy due to severe hyponatremia - will monitor closely, goal will be treating undelying cause once diagnosed     GI/Nutrition GI PROPHYLAXIS as indicated DIET-->TF's as tolerated Constipation protocol as indicated  ENDO - ICU hypoglycemic\Hyperglycemia protocol -check FSBS per protocol   ELECTROLYTES -follow labs as needed -replace as needed -pharmacy consultation   DVT/GI PRX ordered -SCDs  TRANSFUSIONS AS NEEDED MONITOR FSBS ASSESS the need for LABS as needed   Critical care provider statement:    Critical care time (minutes):  109   Critical care time was exclusive of:  Separately billable procedures and treating other patients   Critical care was necessary to treat or prevent imminent or life-threatening deterioration of the  following conditions:  Altered mental status, severe hyponatremia, DM, Right pleural effusion, multiple comorbid conditions.    Critical care was time spent personally by me on the following activities:  Development of treatment plan with patient or surrogate, discussions with consultants, evaluation of patient's response to treatment, examination of patient, obtaining history from patient or surrogate, ordering and performing treatments and interventions, ordering and review of laboratory studies and re-evaluation of patient's condition.  I assumed direction of critical care for this patient from another provider in my specialty: no    This document was prepared using Dragon voice recognition software and may include unintentional dictation errors.    Ottie Glazier, M.D.  Division of [place]

## 2019-02-07 NOTE — Progress Notes (Addendum)
Late note 0800 Patient is very sleepy today. When she is verbal she mumbles non recognizable phases. Will not open mouth for water. Does not hold eye contact. Follows directions for Dr. Revonda Humphrey .Will not smile or acknowledge nurse. Report from night shift states she has been the same for them. 0900 Back from IR. Dressing right posterior mid back - clean and dry.  MAE without issues. Daughter talked with Dr. Revonda Humphrey. Patient spoke to Dr. Tor Netters. She also followed commands.

## 2019-02-07 NOTE — Progress Notes (Signed)
[facility] [place] NAME: [place]    MR#:  [id number]  DATE OF BIRTH:  12-Jun-1941  SUBJECTIVE:   Sodium level is improving.  Mental status still continues to be labile.  Status post ultrasound-guided thoracentesis today with 1.2 L of fluid removed.  REVIEW OF SYSTEMS:    Review of Systems  Unable to perform ROS: Mental acuity    Nutrition: Heart healthy/carb modified Tolerating Diet: yes  Tolerating PT: Await Eval.    DRUG ALLERGIES:   Allergies  Allergen Reactions   Codeine Hives   Atorvastatin Other (See Comments)   Citalopram Other (See Comments)    Nausea   Keflex [Cephalexin] Diarrhea   Tylenol [Acetaminophen] Hives    VITALS:  Blood pressure 112/63, pulse 76, temperature 97.7 F (36.5 C), resp. rate 17, height 5\' 4"  (1.626 m), weight 108 kg, SpO2 98 %.  PHYSICAL EXAMINATION:   Physical Exam  GENERAL:  78 y.o.-year-old patient lying in bed lethargic but follows simple commands.  EYES: Pupils equal, round, reactive to light and accommodation. No scleral icterus. Extraocular muscles intact.  HEENT: Head atraumatic, normocephalic. Oropharynx and nasopharynx clear.  NECK:  Supple, no jugular venous distention. No thyroid enlargement, no tenderness.  LUNGS: Good a/e b/l,  no wheezing, rales, rhonchi. No use of accessory muscles of respiration.  CARDIOVASCULAR: S1, S2 normal. No murmurs, rubs, or gallops.  ABDOMEN: Soft, nontender, nondistended. Bowel sounds present. No organomegaly or mass.  EXTREMITIES: No cyanosis, clubbing or edema b/l.    NEUROLOGIC: Cranial nerves II through XII are intact. No focal Motor or sensory deficits b/l. Globally weak.   PSYCHIATRIC: The patient is alert and oriented x 2.  SKIN: No obvious rash, lesion, or ulcer.    LABORATORY PANEL:   CBC Recent Labs  Lab 02/07/19 0454  WBC 6.3  HGB 12.0  HCT 39.2  PLT 270    ------------------------------------------------------------------------------------------------------------------  Chemistries  Recent Labs  Lab 02/07/19 0454 02/07/19 1034  NA 130* 130*  K 4.2  --   CL 94*  --   CO2 30  --   GLUCOSE 129*  --   BUN 13  --   CREATININE 0.50  --   CALCIUM 9.2  --   MG 2.1  --    ------------------------------------------------------------------------------------------------------------------  Cardiac Enzymes No results for input(s): TROPONINI in the last 168 hours. ------------------------------------------------------------------------------------------------------------------  RADIOLOGY:  Dg Chest Port 1 View  Result Date: 02/07/2019 CLINICAL DATA:  Status post right-sided thoracentesis. EXAM: PORTABLE CHEST 1 VIEW COMPARISON:  02/05/2019 FINDINGS: The heart is enlarged but stable. Stable tortuosity and calcification of the thoracic aorta. The right PICC line is stable. Status post right-sided thoracentesis with interval decrease in size of the right pleural effusion. No postprocedural pneumothorax is identified. Small left pleural effusion and bibasilar atelectasis. IMPRESSION: Status post right-sided thoracentesis with near complete evacuation of the right-sided pleural effusion. No postprocedural pneumothorax. Persistent small left effusion and moderate bibasilar atelectasis. Electronically Signed   By: Marijo Sanes M.D.   On: 02/07/2019 10:33   Dg Chest Port 1 View  Result Date: 02/07/2019 CLINICAL DATA:  Shortness of breath. Patient experiencing increased shortness of breath. EXAM: PORTABLE CHEST 1 VIEW COMPARISON:  Portable chest radiograph 02/05/2019 FINDINGS: Unchanged position of a right-sided PICC with tip projecting over the upper right atrium. May consider withdrawing catheter 2.5-3 cm to place tip cavoatrial junction. The cardiomediastinal silhouette is unchanged. Interval increase in size of a right pleural effusion  with increasing  right basilar atelectasis. Underlying right basilar pneumonia cannot be excluded. Ill-defined opacity throughout the mid to lower right lung has also increased and may reflect incomplete atelectasis and/or developing pneumonia. Mild left basilar atelectasis is unchanged. No evidence of pneumothorax. Degenerative changes of the spine. IMPRESSION: Unchanged position of a right PICC with tip projecting over the right atrium. Consider withdrawing catheter 2.5-3 cm to place tip at cavoatrial junction. Interval increase in size of a right pleural effusion with increasing right basilar atelectasis. Underlying right basilar pneumonia cannot be excluded. Interval increase in ill-defined opacity throughout the mid to lower right lung may reflect incomplete atelectasis and/or developing pneumonia. Electronically Signed   By: Kellie Simmering   On: 02/07/2019 08:14   Dg Chest Port 1 View  Result Date: 02/05/2019 CLINICAL DATA:  PICC line placement EXAM: PORTABLE CHEST 1 VIEW COMPARISON:  Portable exam 2225 hours compared to 1631 hours FINDINGS: RIGHT arm PICC line with tip projecting over superior RIGHT atrium. May consider withdrawing catheter 2.5-3.0 cm to place tip at cavoatrial junction. Stable heart size and mediastinal contours. RIGHT pleural effusion and basilar atelectasis with questionable RIGHT perihilar infiltrate. Minimal LEFT base atelectasis. IMPRESSION: Tip of RIGHT arm PICC line projects over RIGHT atrium; recommend withdrawal 2.5 x 3.0 cm to place tip at approximately the cavoatrial junction. Otherwise no interval change. Electronically Signed   By: Lavonia Dana M.D.   On: 02/05/2019 22:34   Dg Chest Port 1 View  Result Date: 02/05/2019 CLINICAL DATA:  PICC line placement EXAM: PORTABLE CHEST 1 VIEW COMPARISON:  Portable exam 1613 hours compared to 02/03/2019 FINDINGS: RIGHT arm PICC line tip deflects into the azygos arch. Upper normal heart size. Mediastinal contours and pulmonary vascularity normal.  Atherosclerotic calcification aorta. RIGHT lung infiltrate with small RIGHT pleural effusion and basilar atelectasis. LEFT lung clear. No pneumothorax or acute osseous findings. IMPRESSION: Tip of RIGHT arm PICC line deflects into the azygos arch, recommend repositioning into SVC. Increased RIGHT lung infiltrates, RIGHT pleural effusion and basilar atelectasis. Findings called to [facility] in ICU on 02/05/2019 at 1839 hrs. Electronically Signed   By: Lavonia Dana M.D.   On: 02/05/2019 18:40   US Thoracentesis Asp Pleural Space W/img Guide  Result Date: 02/07/2019 INDICATION: Right pleural effusion, hypertension, diabetes EXAM: ULTRASOUND GUIDED RIGHT THORACENTESIS MEDICATIONS: 1% lidocaine local COMPLICATIONS: None immediate. PROCEDURE: An ultrasound guided thoracentesis was thoroughly discussed with the patient and questions answered. The benefits, risks, alternatives and complications were also discussed. The patient understands and wishes to proceed with the procedure. Written consent was obtained. Ultrasound was performed to localize and mark an adequate pocket of fluid in the right chest. The area was then prepped and draped in the normal sterile fashion. 1% Lidocaine was used for local anesthesia. Under ultrasound guidance a 6 Fr Safe-T-Centesis catheter was introduced. Thoracentesis was performed. The catheter was removed and a dressing applied. FINDINGS: A total of approximately 1.2 L of amber colored pleural fluid was removed. Samples were sent to the laboratory as requested by the clinical team. IMPRESSION: Successful ultrasound guided right thoracentesis yielding 1.2 L of pleural fluid. Electronically Signed   By: Jerilynn Mages.  Shick M.D.   On: 02/07/2019 10:10     ASSESSMENT AND PLAN:   78 year old female with past medical history of diabetes, hypertension who presents to the hospital due to shortness of breath and weakness and noted to be severely hyponatremic.  1.  Acute respiratory failure with  hypoxia-secondary to a right-sided pleural effusion. -  Patient underwent CT of the chest which showed a moderate to large right-sided pleural effusion with associated consolidation. -Status post ultrasound-guided thoracentesis with 1.3 L of fluid removed.  Fluid analysis consistent with a transudative effusion.  Continue O2 supplementation.  Await cytology on the pleural fluid.  2.  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 -Nephrology following and patient is on hypertonic saline.  Sodium improved from 1 22-1 30 today.  Hold off on diuretics. -Continue further care as per nephrology.  3. Essential HTN - cont. Atenolol, Vasotec.  - BP stable.   4. DM Type II w/out complication - cont. SSI and follow BS  5. GERD - cont. Protonix.   6.  Altered mental status/lethargy-etiology unclear but suspect to be multifactorial related to underlying hyponatremia, possible underlying dementia/cognitive decline. -If not improving would consider getting a CT scan of the head.  All the records are reviewed and case discussed with Care Management/Social Worker. Management plans discussed with the patient, family and they are in agreement.  CODE STATUS: Full code  DVT Prophylaxis: Lovenox  TOTAL TIME TAKING CARE OF THIS PATIENT: 30 minutes.   POSSIBLE D/C IN 2-3 DAYS, DEPENDING ON CLINICAL CONDITION.   Henreitta Leber M.D on 02/07/2019 at 3:02 PM  Between 7am to 6pm - Pager - [phone]  After 6pm go to [email or web address] - Proofreader  [facility]  Office  [phone]  CC: Primary care physician; Sallee Lange, NP

## 2019-02-07 NOTE — Procedures (Signed)
Rt eff  S/P RT THORA  1.2 L liters amber pleural fld removed  No comp Stable ebl min Labs sent cxr pending

## 2019-02-07 NOTE — Consult Note (Signed)
[place] for Sodium Monitoring Patient is on 3% sodium chloride continuous infusion. Follow up NA levels PRN with provider.  Recent Labs: Potassium (mmol/L)  Date Value  02/07/2019 4.2   Magnesium (mg/dL)  Date Value  02/07/2019 2.1   Calcium (mg/dL)  Date Value  02/07/2019 9.2   Phosphorus (mg/dL)  Date Value  02/07/2019 2.4 (L)   Sodium (mmol/L)  Date Value  02/07/2019 130 (L)     Assessment: Patient was started on hypertonic saline as a result of hyponatremia suspicious for SIADH.   7/25 @ 1417: Na: 117 7/25 @ 1814: Na 117 7/25 @ 2027: Na 118 - Infusion increased to 35 mL/hr  7/26 @ 0120: Na 120 7/26 @ 0539: Na 133 - provider notified by pharmacist to stop infusion & recheck sodium level - repeat lab 122 7/26 @ 0657: Na  122 7/26 @1318 : NA 124 7/26 @ 1735: Na 124 7/26 @ 2139: Na 126 7/27 @ 0135: Na 128 7/27 @ 0454  Na 130    Goal of Therapy:  Sodium WNL.  If Na increase > 4 mEq in 2 hours or > 6 mEq/L in 4 hours contact pharmacy.   Plan:  Sodium Chloride 3% infusing at 35 ml/hr. Level slowly trending up ~ 55mmol/L every 4 hours.   Pharmacy will continue to follow Na levels every 4 hours.   Pernell Dupre, PharmD, BCPS Clinical Pharmacist 02/07/2019 5:40 AM

## 2019-02-07 NOTE — Consult Note (Signed)
[place] for Sodium Monitoring Patient is on 3% sodium chloride continuous infusion. Follow up NA levels PRN with provider.  Recent Labs: Potassium (mmol/L)  Date Value  02/05/2019 3.9   Magnesium (mg/dL)  Date Value  02/06/2019 1.7   Calcium (mg/dL)  Date Value  02/05/2019 8.5 (L)   Phosphorus (mg/dL)  Date Value  02/06/2019 2.1 (L)   Sodium (mmol/L)  Date Value  02/07/2019 128 (L)     Assessment: Patient was started on hypertonic saline as a result of hyponatremia suspicious for SIADH.   7/25 @ 1417: Na: 117 7/25 @ 1814: Na 117 7/25 @ 2027: Na 118 - Infusion increased to 35 mL/hr  7/26 @ 0120: Na 120 7/26 @ 0539: Na 133 - provider notified by pharmacist to stop infusion & recheck sodium level - repeat lab 122 7/26 @ 0657: Na  122 7/26 @1318 : NA 124 7/26 @ 1735: Na 124 7/26 @ 2139: Na 126 7/27 @ 0135: Na 128   Goal of Therapy:  Sodium WNL.  If Na increase > 4 mEq in 2 hours or > 6 mEq/L in 4 hours contact pharmacy.   Plan:  Sodium Chloride 3% infusing at 35 ml/hr. Level slowly trending up ~ 77mmol/L every 4 hours.   Pharmacy will continue to follow Na levels every 4 hours.   Pernell Dupre, PharmD, BCPS Clinical Pharmacist 02/07/2019 2:09 AM

## 2019-02-08 ENCOUNTER — Inpatient Hospital Stay: Payer: Medicare Other

## 2019-02-08 DIAGNOSIS — R531 Weakness: Secondary | ICD-10-CM

## 2019-02-08 DIAGNOSIS — E871 Hypo-osmolality and hyponatremia: Secondary | ICD-10-CM

## 2019-02-08 DIAGNOSIS — J910 Malignant pleural effusion: Secondary | ICD-10-CM

## 2019-02-08 DIAGNOSIS — Z87891 Personal history of nicotine dependence: Secondary | ICD-10-CM

## 2019-02-08 DIAGNOSIS — J90 Pleural effusion, not elsewhere classified: Secondary | ICD-10-CM

## 2019-02-08 LAB — COMPREHENSIVE METABOLIC PANEL
ALT: 19 U/L (ref 0–44)
AST: 17 U/L (ref 15–41)
Albumin: 3.1 g/dL — ABNORMAL LOW (ref 3.5–5.0)
Alkaline Phosphatase: 51 U/L (ref 38–126)
Anion gap: 8 (ref 5–15)
BUN: 14 mg/dL (ref 8–23)
CO2: 30 mmol/L (ref 22–32)
Calcium: 9.3 mg/dL (ref 8.9–10.3)
Chloride: 95 mmol/L — ABNORMAL LOW (ref 98–111)
Creatinine, Ser: 0.5 mg/dL (ref 0.44–1.00)
GFR calc Af Amer: >60 mL/min (ref >60)
GFR calc non Af Amer: >60 mL/min (ref >60)
Glucose, Bld: 109 mg/dL — ABNORMAL HIGH (ref 70–99)
Potassium: 4.2 mmol/L (ref 3.5–5.1)
Sodium: 133 mmol/L — ABNORMAL LOW (ref 135–145)
Total Bilirubin: 0.8 mg/dL (ref 0.3–1.2)
Total Protein: 5.5 g/dL — ABNORMAL LOW (ref 6.5–8.1)

## 2019-02-08 LAB — SODIUM
Sodium: 132 mmol/L — ABNORMAL LOW (ref 135–145)
Sodium: 133 mmol/L — ABNORMAL LOW (ref 135–145)
Sodium: 134 mmol/L — ABNORMAL LOW (ref 135–145)
Sodium: 132 mmol/L — ABNORMAL LOW (ref 135–145)
Sodium: 134 mmol/L — ABNORMAL LOW (ref 135–145)

## 2019-02-08 LAB — PROTEIN, BODY FLUID (OTHER): Total Protein, Body Fluid Other: 2.8 g/dL

## 2019-02-08 LAB — CBC WITH DIFFERENTIAL/PLATELET
Abs Immature Granulocytes: 0.12 10*3/uL — ABNORMAL HIGH (ref 0.00–0.07)
Basophils Absolute: 0 10*3/uL (ref 0.0–0.1)
Basophils Relative: 0 %
Eosinophils Absolute: 0 10*3/uL (ref 0.0–0.5)
Eosinophils Relative: 1 %
HCT: 36.9 % (ref 36.0–46.0)
Hemoglobin: 11.3 g/dL — ABNORMAL LOW (ref 12.0–15.0)
Immature Granulocytes: 2 %
Lymphocytes Relative: 10 %
Lymphs Abs: 0.6 10*3/uL — ABNORMAL LOW (ref 0.7–4.0)
MCH: 24.6 pg — ABNORMAL LOW (ref 26.0–34.0)
MCHC: 30.6 g/dL (ref 30.0–36.0)
MCV: 80.4 fL (ref 80.0–100.0)
Monocytes Absolute: 0.9 10*3/uL (ref 0.1–1.0)
Monocytes Relative: 14 %
Neutro Abs: 4.6 10*3/uL (ref 1.7–7.7)
Neutrophils Relative %: 73 %
Platelets: 240 10*3/uL (ref 150–400)
RBC: 4.59 MIL/uL (ref 3.87–5.11)
RDW: 14.5 % (ref 11.5–15.5)
WBC: 6.3 10*3/uL (ref 4.0–10.5)
nRBC: 0 % (ref 0.0–0.2)

## 2019-02-08 LAB — MAGNESIUM: Magnesium: 2 mg/dL (ref 1.7–2.4)

## 2019-02-08 LAB — LD, BODY FLUID (OTHER): LD, Body Fluid: 144 IU/L

## 2019-02-08 LAB — GLUCOSE, CAPILLARY
Glucose-Capillary: 95 mg/dL (ref 70–99)
Glucose-Capillary: 97 mg/dL (ref 70–99)
Glucose-Capillary: 95 mg/dL (ref 70–99)
Glucose-Capillary: 105 mg/dL — ABNORMAL HIGH (ref 70–99)

## 2019-02-08 LAB — PHOSPHORUS: Phosphorus: 1.6 mg/dL — ABNORMAL LOW (ref 2.5–4.6)

## 2019-02-08 LAB — PH, BODY FLUID: pH, Body Fluid: 7.3

## 2019-02-08 MED ORDER — IPRATROPIUM-ALBUTEROL 0.5-2.5 (3) MG/3ML IN SOLN
3.0000 mL | RESPIRATORY_TRACT | Status: DC | PRN
  Administered 2019-02-08 – 2019-02-14 (×5): 3 mL via RESPIRATORY_TRACT
  Filled 2019-02-08 (×5): qty 3

## 2019-02-08 MED ORDER — SODIUM PHOSPHATES 45 MMOLE/15ML IV SOLN
30.0000 mmol | Freq: Once | INTRAVENOUS | Status: AC
  Administered 2019-02-08: 11:00:00 30 mmol via INTRAVENOUS
  Filled 2019-02-08: qty 10

## 2019-02-08 MED ORDER — ENOXAPARIN SODIUM 40 MG/0.4ML ~~LOC~~ SOLN
40.0000 mg | Freq: Two times a day (BID) | SUBCUTANEOUS | Status: DC
  Administered 2019-02-08 – 2019-02-14 (×13): 40 mg via SUBCUTANEOUS
  Filled 2019-02-08 (×13): qty 0.4

## 2019-02-08 MED ORDER — SODIUM CHLORIDE 0.9% FLUSH: 10.0000 mL | INTRAVENOUS | Status: DC | PRN

## 2019-02-08 MED ORDER — SODIUM CHLORIDE 0.9% FLUSH
10.0000 mL | Freq: Two times a day (BID) | INTRAVENOUS | Status: DC
  Administered 2019-02-08: 22:00:00 10 mL
  Administered 2019-02-09: 40 mL
  Administered 2019-02-09: 20 mL
  Administered 2019-02-10 – 2019-02-14 (×9): 10 mL

## 2019-02-08 NOTE — Progress Notes (Signed)
[facility]  ROUNDING NOTE   Subjective:   Na 133- off hypertonic infusion.   Patient alert to self and place. Eating breakfast. Slow to answer.    Objective:  Vital signs in last 24 hours:  Temp:  [97.8 F (36.6 C)-98.4 F (36.9 C)] 98.2 F (36.8 C) (07/28 0800) Pulse Rate:  [63-90] 87 (07/28 0919) Resp:  [13-22] 16 (07/28 0800) BP: (103-153)/(45-90) 127/59 (07/28 0919) SpO2:  [94 %-100 %] 100 % (07/28 0800)  Weight change:  Filed Weights   02/03/19 1729 02/05/19 1414  Weight: 96.6 kg 108 kg    Intake/Output: I/O last 3 completed shifts: In: -  Out: 980 [Urine:980]   Intake/Output this shift:  Total I/O In: -  Out: 150 [Urine:150]  Physical Exam: General: No acute distress, laying in bed  Head: Normocephalic, atraumatic. Moist oral mucosal membranes  Eyes: Anicteric  Neck: Supple, trachea midline  Lungs:  Clear to auscultation, normal effort  Heart: regular  Abdomen:  Soft, nontender, bowel sounds present  Extremities: Trace peripheral edema.  Neurologic: Awake, alert, following commands  Skin: No lesions       Basic Metabolic Panel: Recent Labs  Lab 02/03/19 1804  02/04/19 0255  02/05/19 0556  02/06/19 0539  02/07/19 0454  02/07/19 1420 02/07/19 1613 02/07/19 2118 02/08/19 0118 02/08/19 0539  NA 113*   < > 118*   < > 117*   < > 133*   < > 130*   < > 140 132* 132* 132* 133*  K 3.4*  --  3.5  --  3.9  --   --   --  4.2  --   --   --   --   --  4.2  CL 74*  --  79*  --  79*  --   --   --  94*  --   --   --   --   --  95*  CO2 27  --  30  --  30  --   --   --  30  --   --   --   --   --  30  GLUCOSE 135*  --  126*  --  119*  --   --   --  129*  --   --   --   --   --  109*  BUN 7*  --  7*  --  7*  --   --   --  13  --   --   --   --   --  14  CREATININE 0.43*  --  0.48  --  0.39*  --   --   --  0.50  --   --   --   --   --  0.50  CALCIUM 8.9  --  8.7*  --  8.5*  --   --   --  9.2  --   --   --   --   --  9.3  MG  --   --   --   --   1.7  --  1.7  --  2.1  --   --   --   --   --  2.0  PHOS  --   --   --   --   --   --  2.1*  --  2.4*  --   --   --   --   --   --    < > =  values in this interval not displayed.    Liver Function Tests: Recent Labs  Lab 02/08/19 0539  AST 17  ALT 19  ALKPHOS 51  BILITOT 0.8  PROT 5.5*  ALBUMIN 3.1*   No results for input(s): LIPASE, AMYLASE in the last 168 hours. No results for input(s): AMMONIA in the last 168 hours.  CBC: Recent Labs  Lab 02/03/19 1804 02/04/19 0255 02/05/19 0556 02/06/19 0539 02/07/19 0454 02/08/19 0539  WBC 6.2 6.1 5.9 6.9 6.3 6.3  NEUTROABS 4.5  --   --   --  4.6 4.6  HGB 12.2 11.7* 12.6 12.0 12.0 11.3*  HCT 36.6 36.1 39.3 37.5 39.2 36.9  MCV 74.4* 75.5* 75.7* 77.2* 80.5 80.4  PLT 274 236 266 264 270 240    Cardiac Enzymes: No results for input(s): CKTOTAL, CKMB, CKMBINDEX, TROPONINI in the last 168 hours.  BNP: Invalid input(s): POCBNP  CBG: Recent Labs  Lab 02/07/19 0715 02/07/19 1156 02/07/19 1618 02/07/19 2129 02/08/19 0719  GLUCAP 101* 115* 112* 118* 95    Microbiology: Results for orders placed or performed during the hospital encounter of 02/03/19  SARS Coronavirus 2 (CEPHEID- Performed in [place] hospital lab), Hosp Order     Status: None   Collection Time: 02/03/19  6:55 PM   Specimen: Nasopharyngeal Swab  Result Value Ref Range Status   SARS Coronavirus 2 NEGATIVE NEGATIVE Final    Comment: (NOTE) If result is NEGATIVE SARS-CoV-2 target nucleic acids are NOT DETECTED. The SARS-CoV-2 RNA is generally detectable in upper and lower  respiratory specimens during the acute phase of infection. The lowest  concentration of SARS-CoV-2 viral copies this assay can detect is 250  copies / mL. A negative result does not preclude SARS-CoV-2 infection  and should not be used as the sole basis for treatment or other  patient management decisions.  A negative result may occur with  improper specimen collection / handling,  submission of specimen other  than nasopharyngeal swab, presence of viral mutation(s) within the  areas targeted by this assay, and inadequate number of viral copies  (<250 copies / mL). A negative result must be combined with clinical  observations, patient history, and epidemiological information. If result is POSITIVE SARS-CoV-2 target nucleic acids are DETECTED. The SARS-CoV-2 RNA is generally detectable in upper and lower  respiratory specimens dur ing the acute phase of infection.  Positive  results are indicative of active infection with SARS-CoV-2.  Clinical  correlation with patient history and other diagnostic information is  necessary to determine patient infection status.  Positive results do  not rule out bacterial infection or co-infection with other viruses. If result is PRESUMPTIVE POSTIVE SARS-CoV-2 nucleic acids MAY BE PRESENT.   A presumptive positive result was obtained on the submitted specimen  and confirmed on repeat testing.  While 2019 novel coronavirus  (SARS-CoV-2) nucleic acids may be present in the submitted sample  additional confirmatory testing may be necessary for epidemiological  and / or clinical management purposes  to differentiate between  SARS-CoV-2 and other Sarbecovirus currently known to infect humans.  If clinically indicated additional testing with an alternate test  methodology [phone]) is advised. The SARS-CoV-2 RNA is generally  detectable in upper and lower respiratory sp ecimens during the acute  phase of infection. The expected result is Negative. Fact Sheet for Patients:  StrictlyIdeas.no Fact Sheet for Healthcare Providers: BankingDealers.co.za This test is not yet approved or cleared by the [place] FDA and has been authorized for detection  and/or diagnosis of SARS-CoV-2 by FDA under an Emergency Use Authorization (EUA).  This EUA will remain in effect (meaning this test can be  used) for the duration of the COVID-19 declaration under Section 564(b)(1) of the Act, 21 U.S.C. section 360bbb-3(b)(1), unless the authorization is terminated or revoked sooner. Performed at [facility], [address] [place], [place] [address]   MRSA PCR Screening     Status: None   Collection Time: 02/05/19  2:17 PM   Specimen: Nasopharyngeal  Result Value Ref Range Status   MRSA by PCR NEGATIVE NEGATIVE Final    Comment:        The GeneXpert MRSA Assay (FDA approved for NASAL specimens only), is one component of a comprehensive MRSA colonization surveillance program. It is not intended to diagnose MRSA infection nor to guide or monitor treatment for MRSA infections. Performed at [facility], [address] [place], [place] [address]   Body fluid culture     Status: None (Preliminary result)   Collection Time: 02/07/19  8:09 AM   Specimen: Pleura; Body Fluid  Result Value Ref Range Status   Specimen Description   Final    PLEURAL Performed at [facility], [address] [place], [place] [address]    Special Requests   Final    PLEURAL Performed at [facility], [place], [place] [address]    Gram Stain   Final    RARE WBC PRESENT,BOTH PMN AND MONONUCLEAR NO ORGANISMS SEEN    Culture   Final    NO GROWTH < 24 HOURS Performed at [facility], [place] [address], [place], [place] [address]    Report Status PENDING  Incomplete    Coagulation Studies: No results for input(s): LABPROT, INR in the last 72 hours.  Urinalysis: No results for input(s): COLORURINE, LABSPEC, PHURINE, GLUCOSEU, HGBUR, BILIRUBINUR, KETONESUR, PROTEINUR, UROBILINOGEN, NITRITE, LEUKOCYTESUR in the last 72 hours.  Invalid input(s): APPERANCEUR    Imaging: Mr Brain Wo Contrast  Result Date: 02/07/2019 CLINICAL DATA:  Initial evaluation for acute confusion, encephalopathy. EXAM: MRI HEAD WITHOUT CONTRAST TECHNIQUE: Multiplanar,  multiecho pulse sequences of the brain and surrounding structures were obtained without intravenous contrast. COMPARISON:  None available. FINDINGS: Brain: Examination mildly degraded by motion artifact. Diffuse prominence of the CSF containing spaces compatible with generalized age-related cerebral atrophy. Mild scattered patchy T2/FLAIR hyperintensity within the periventricular deep white matter both cerebral hemispheres, most consistent with chronic microvascular ischemic disease, mild for age. No abnormal foci of restricted diffusion to suggest acute or subacute ischemia. Gray-white matter differentiation maintained. No encephalomalacia to suggest chronic cortical infarction. No foci of susceptibility artifact to suggest acute or chronic intracranial hemorrhage. No mass lesion, midline shift or mass effect. No hydrocephalus. The extra-axial fluid collection. Pituitary gland within normal limits. Midline structures intact. Vascular: Major intracranial vascular flow voids are maintained Skull and upper cervical spine: Craniocervical junction within normal limits. Multilevel degenerative spondylolysis noted within the visualized upper cervical spine without significant stenosis. Bone marrow signal intensity normal. No scalp soft tissue abnormality. Sinuses/Orbits: Globes and orbital soft tissues within normal limits. Paranasal sinuses are clear. Left mastoid effusion noted. Inner ear structures grossly normal. Other: None. IMPRESSION: 1. No acute intracranial abnormality. 2. Mild age-related cerebral atrophy with chronic microvascular ischemic disease. 3. Left mastoid effusion, of uncertain significance. Correlation with physical exam and symptomatology for possible otomastoiditis recommended. Electronically Signed   By: Jeannine Boga M.D.   On: 02/07/2019 20:33  Dg Chest Port 1 View  Result Date: 02/07/2019 CLINICAL DATA:  Status post right-sided thoracentesis. EXAM: PORTABLE CHEST 1 VIEW COMPARISON:   02/05/2019 FINDINGS: The heart is enlarged but stable. Stable tortuosity and calcification of the thoracic aorta. The right PICC line is stable. Status post right-sided thoracentesis with interval decrease in size of the right pleural effusion. No postprocedural pneumothorax is identified. Small left pleural effusion and bibasilar atelectasis. IMPRESSION: Status post right-sided thoracentesis with near complete evacuation of the right-sided pleural effusion. No postprocedural pneumothorax. Persistent small left effusion and moderate bibasilar atelectasis. Electronically Signed   By: Marijo Sanes M.D.   On: 02/07/2019 10:33   Dg Chest Port 1 View  Result Date: 02/07/2019 CLINICAL DATA:  Shortness of breath. Patient experiencing increased shortness of breath. EXAM: PORTABLE CHEST 1 VIEW COMPARISON:  Portable chest radiograph 02/05/2019 FINDINGS: Unchanged position of a right-sided PICC with tip projecting over the upper right atrium. May consider withdrawing catheter 2.5-3 cm to place tip cavoatrial junction. The cardiomediastinal silhouette is unchanged. Interval increase in size of a right pleural effusion with increasing right basilar atelectasis. Underlying right basilar pneumonia cannot be excluded. Ill-defined opacity throughout the mid to lower right lung has also increased and may reflect incomplete atelectasis and/or developing pneumonia. Mild left basilar atelectasis is unchanged. No evidence of pneumothorax. Degenerative changes of the spine. IMPRESSION: Unchanged position of a right PICC with tip projecting over the right atrium. Consider withdrawing catheter 2.5-3 cm to place tip at cavoatrial junction. Interval increase in size of a right pleural effusion with increasing right basilar atelectasis. Underlying right basilar pneumonia cannot be excluded. Interval increase in ill-defined opacity throughout the mid to lower right lung may reflect incomplete atelectasis and/or developing pneumonia.  Electronically Signed   By: Kellie Simmering   On: 02/07/2019 08:14   US Thoracentesis Asp Pleural Space W/img Guide  Result Date: 02/07/2019 INDICATION: Right pleural effusion, hypertension, diabetes EXAM: ULTRASOUND GUIDED RIGHT THORACENTESIS MEDICATIONS: 1% lidocaine local COMPLICATIONS: None immediate. PROCEDURE: An ultrasound guided thoracentesis was thoroughly discussed with the patient and questions answered. The benefits, risks, alternatives and complications were also discussed. The patient understands and wishes to proceed with the procedure. Written consent was obtained. Ultrasound was performed to localize and mark an adequate pocket of fluid in the right chest. The area was then prepped and draped in the normal sterile fashion. 1% Lidocaine was used for local anesthesia. Under ultrasound guidance a 6 Fr Safe-T-Centesis catheter was introduced. Thoracentesis was performed. The catheter was removed and a dressing applied. FINDINGS: A total of approximately 1.2 L of amber colored pleural fluid was removed. Samples were sent to the laboratory as requested by the clinical team. IMPRESSION: Successful ultrasound guided right thoracentesis yielding 1.2 L of pleural fluid. Electronically Signed   By: Jerilynn Mages.  Shick M.D.   On: 02/07/2019 10:10     Medications:    . atenolol  50 mg Oral Daily  . enalapril  20 mg Oral BID  . enoxaparin (LOVENOX) injection  40 mg Subcutaneous Q24H  . insulin aspart  0-5 Units Subcutaneous QHS  . insulin aspart  0-9 Units Subcutaneous TID WC  . pantoprazole  40 mg Oral Daily   hydrALAZINE, ondansetron **OR** ondansetron (ZOFRAN) IV, traZODone  Assessment/ Plan:  Ms. Cathy Buck is a 78 y.o. black female with diabetes mellitus type 2, hypertension, hyperlipidemia, GERD, cervical myelopathy, lumbar stenosis, seasonal allergies, degenerative disc disease, vitamin D deficiency who was [facility] to [facility]/03/2019 for evaluation of  significant shortness of breath and  weakness.  She was found to have a large right pleural effusion with underlying consolidation as well.   1.  Hyponatremia, suspicious for SIADH. Status post hypertonic saline infusion.  - Continue serial sodium checks.  - Fluid restriction  2.  Hypertension: well controlled 127/59. Atenolol and enalapril.   3. Hypokalemia: improved with potassium supplements.   4.  Right-sided pleural effusion: status post right sided ultrasound guided thoracentesis on 7/27 yeilding 1.2 liters of fluid. Pathology pending. Labs are consistent with transudative effusion Concern for underlying lung mass.    LOS: 5 Kameryn Davern 7/28/20209:30 AM

## 2019-02-08 NOTE — Progress Notes (Signed)
PHARMACIST - PHYSICIAN COMMUNICATION  CONCERNING:  Enoxaparin (Lovenox) for DVT Prophylaxis   RECOMMENDATION: Patient was prescribed enoxaprin 40mg  q24 hours for VTE prophylaxis.   Filed Weights   02/03/19 1729 02/05/19 1414  Weight: 213 lb (96.6 kg) 238 lb 1.6 oz (108 kg)    Body mass index is 40.87 kg/m.  Estimated Creatinine Clearance: 69.5 mL/min (by C-G formula based on SCr of 0.5 mg/dL).  Based on [place] patient is candidate for enoxaparin 40mg  every 12 hour dosing due to BMI being >40.  DESCRIPTION: Pharmacy has adjusted enoxaparin dose per [facility] policy.  Patient is now receiving enoxaparin 40mg  every 12 hours.   Simpson,Michael L, RPh 02/08/2019 10:09 AM

## 2019-02-08 NOTE — Progress Notes (Addendum)
Pharmacy Electrolyte Monitoring Consult:  Pharmacy consulted to assist in monitoring and replacing electrolytes in this 78 y.o. female admitted on 02/03/2019 with Shortness of Breath and Weakness  Patient initiated on 3% sodium chloride on 7/25.   Labs:  Sodium (mmol/L)  Date Value  02/08/2019 133 (L)   Potassium (mmol/L)  Date Value  02/08/2019 4.2   Magnesium (mg/dL)  Date Value  02/08/2019 2.0   Phosphorus (mg/dL)  Date Value  02/08/2019 1.6 (L)   Calcium (mg/dL)  Date Value  02/08/2019 9.3   Albumin (g/dL)  Date Value  02/08/2019 3.1 (L)    Assessment/Plan: Hypertonic saline stopped on 7/27.   Sodium phosphorus 39mmol X 1.   BMP/Magnesium/Phosphorus with am labs.   Will replace to maintain electrolytes within normal limits.   Pharmacy will continue to monitor and adjust per consult.   Cathy Buck L 02/08/2019 10:10 AM

## 2019-02-08 NOTE — Progress Notes (Signed)
[facility] [place] NAME: [place]    MR#:  [id number]  DATE OF BIRTH:  1941/07/04  SUBJECTIVE:   Sodium level has improved and normalized.  Continues to be somewhat lethargic and confused.  Had an MRI of the brain yesterday which was negative for acute pathology.  No other acute events overnight.  REVIEW OF SYSTEMS:    Review of Systems  Unable to perform ROS: Mental acuity    Nutrition: Heart healthy/carb modified Tolerating Diet:No due to lethargy Tolerating PT: Await Eval.    DRUG ALLERGIES:   Allergies  Allergen Reactions   Codeine Hives   Atorvastatin Other (See Comments)   Citalopram Other (See Comments)    Nausea   Keflex [Cephalexin] Diarrhea   Tylenol [Acetaminophen] Hives    VITALS:  Blood pressure 122/61, pulse 65, temperature 98.7 F (37.1 C), temperature source Axillary, resp. rate 19, height 5\' 4"  (1.626 m), weight 108 kg, SpO2 95 %.  PHYSICAL EXAMINATION:   Physical Exam  GENERAL:  78 y.o.-year-old patient lying in bed lethargic but follows simple commands.  EYES: Pupils equal, round, reactive to light and accommodation. No scleral icterus. Extraocular muscles intact.  HEENT: Head atraumatic, normocephalic. Oropharynx and nasopharynx clear.  NECK:  Supple, no jugular venous distention. No thyroid enlargement, no tenderness.  LUNGS: Good a/e b/l,  no wheezing, rales, rhonchi. No use of accessory muscles of respiration.  CARDIOVASCULAR: S1, S2 normal. No murmurs, rubs, or gallops.  ABDOMEN: Soft, nontender, nondistended. Bowel sounds present. No organomegaly or mass.  EXTREMITIES: No cyanosis, clubbing or edema b/l.    NEUROLOGIC: Cranial nerves II through XII are intact. No focal Motor or sensory deficits b/l. Globally weak.   PSYCHIATRIC: The patient is alert and oriented x 2.  SKIN: No obvious rash, lesion, or ulcer.    LABORATORY PANEL:   CBC Recent Labs  Lab 02/08/19 0539  WBC 6.3    HGB 11.3*  HCT 36.9  PLT 240   ------------------------------------------------------------------------------------------------------------------  Chemistries  Recent Labs  Lab 02/08/19 0539  02/08/19 1232  NA 133*   < > 134*  K 4.2  --   --   CL 95*  --   --   CO2 30  --   --   GLUCOSE 109*  --   --   BUN 14  --   --   CREATININE 0.50  --   --   CALCIUM 9.3  --   --   MG 2.0  --   --   AST 17  --   --   ALT 19  --   --   ALKPHOS 51  --   --   BILITOT 0.8  --   --    < > = values in this interval not displayed.   ------------------------------------------------------------------------------------------------------------------  Cardiac Enzymes No results for input(s): TROPONINI in the last 168 hours. ------------------------------------------------------------------------------------------------------------------  RADIOLOGY:  Mr Brain Wo Contrast  Result Date: 02/07/2019 CLINICAL DATA:  Initial evaluation for acute confusion, encephalopathy. EXAM: MRI HEAD WITHOUT CONTRAST TECHNIQUE: Multiplanar, multiecho pulse sequences of the brain and surrounding structures were obtained without intravenous contrast. COMPARISON:  None available. FINDINGS: Brain: Examination mildly degraded by motion artifact. Diffuse prominence of the CSF containing spaces compatible with generalized age-related cerebral atrophy. Mild scattered patchy T2/FLAIR hyperintensity within the periventricular deep white matter both cerebral hemispheres, most consistent with chronic microvascular ischemic disease, mild for age. No abnormal foci of restricted diffusion  to suggest acute or subacute ischemia. Gray-white matter differentiation maintained. No encephalomalacia to suggest chronic cortical infarction. No foci of susceptibility artifact to suggest acute or chronic intracranial hemorrhage. No mass lesion, midline shift or mass effect. No hydrocephalus. The extra-axial fluid collection. Pituitary gland within  normal limits. Midline structures intact. Vascular: Major intracranial vascular flow voids are maintained Skull and upper cervical spine: Craniocervical junction within normal limits. Multilevel degenerative spondylolysis noted within the visualized upper cervical spine without significant stenosis. Bone marrow signal intensity normal. No scalp soft tissue abnormality. Sinuses/Orbits: Globes and orbital soft tissues within normal limits. Paranasal sinuses are clear. Left mastoid effusion noted. Inner ear structures grossly normal. Other: None. IMPRESSION: 1. No acute intracranial abnormality. 2. Mild age-related cerebral atrophy with chronic microvascular ischemic disease. 3. Left mastoid effusion, of uncertain significance. Correlation with physical exam and symptomatology for possible otomastoiditis recommended. Electronically Signed   By: Jeannine Boga M.D.   On: 02/07/2019 20:33   Dg Chest Port 1 View  Result Date: 02/08/2019 CLINICAL DATA:  Followup pleural effusion. EXAM: PORTABLE CHEST 1 VIEW COMPARISON:  02/07/2019 and earlier exams. FINDINGS: Opacity at the right lung base mostly obscures hemidiaphragm consistent with a small effusion and atelectasis. Opacity at the left lung base is less prominent than on the previous day's study, the difference likely due to differences in patient positioning only. There is hazy opacity consistent with a smaller left pleural effusion. No new lung abnormalities. No evidence of pulmonary edema. No pneumothorax. Right-sided PICC is stable. IMPRESSION: 1. No significant change from the most recent prior study allowing for differences in patient positioning. 2. Small residual right pleural effusion with associated atelectasis. Smaller left pleural effusion with atelectasis. No convincing pulmonary edema. No pneumothorax. Electronically Signed   By: Lajean Manes M.D.   On: 02/08/2019 13:25   Dg Chest Port 1 View  Result Date: 02/07/2019 CLINICAL DATA:  Status  post right-sided thoracentesis. EXAM: PORTABLE CHEST 1 VIEW COMPARISON:  02/05/2019 FINDINGS: The heart is enlarged but stable. Stable tortuosity and calcification of the thoracic aorta. The right PICC line is stable. Status post right-sided thoracentesis with interval decrease in size of the right pleural effusion. No postprocedural pneumothorax is identified. Small left pleural effusion and bibasilar atelectasis. IMPRESSION: Status post right-sided thoracentesis with near complete evacuation of the right-sided pleural effusion. No postprocedural pneumothorax. Persistent small left effusion and moderate bibasilar atelectasis. Electronically Signed   By: Marijo Sanes M.D.   On: 02/07/2019 10:33   Dg Chest Port 1 View  Result Date: 02/07/2019 CLINICAL DATA:  Shortness of breath. Patient experiencing increased shortness of breath. EXAM: PORTABLE CHEST 1 VIEW COMPARISON:  Portable chest radiograph 02/05/2019 FINDINGS: Unchanged position of a right-sided PICC with tip projecting over the upper right atrium. May consider withdrawing catheter 2.5-3 cm to place tip cavoatrial junction. The cardiomediastinal silhouette is unchanged. Interval increase in size of a right pleural effusion with increasing right basilar atelectasis. Underlying right basilar pneumonia cannot be excluded. Ill-defined opacity throughout the mid to lower right lung has also increased and may reflect incomplete atelectasis and/or developing pneumonia. Mild left basilar atelectasis is unchanged. No evidence of pneumothorax. Degenerative changes of the spine. IMPRESSION: Unchanged position of a right PICC with tip projecting over the right atrium. Consider withdrawing catheter 2.5-3 cm to place tip at cavoatrial junction. Interval increase in size of a right pleural effusion with increasing right basilar atelectasis. Underlying right basilar pneumonia cannot be excluded. Interval increase in ill-defined opacity throughout the mid  to lower right  lung may reflect incomplete atelectasis and/or developing pneumonia. Electronically Signed   By: Kellie Simmering   On: 02/07/2019 08:14   US Thoracentesis Asp Pleural Space W/img Guide  Result Date: 02/07/2019 INDICATION: Right pleural effusion, hypertension, diabetes EXAM: ULTRASOUND GUIDED RIGHT THORACENTESIS MEDICATIONS: 1% lidocaine local COMPLICATIONS: None immediate. PROCEDURE: An ultrasound guided thoracentesis was thoroughly discussed with the patient and questions answered. The benefits, risks, alternatives and complications were also discussed. The patient understands and wishes to proceed with the procedure. Written consent was obtained. Ultrasound was performed to localize and mark an adequate pocket of fluid in the right chest. The area was then prepped and draped in the normal sterile fashion. 1% Lidocaine was used for local anesthesia. Under ultrasound guidance a 6 Fr Safe-T-Centesis catheter was introduced. Thoracentesis was performed. The catheter was removed and a dressing applied. FINDINGS: A total of approximately 1.2 L of amber colored pleural fluid was removed. Samples were sent to the laboratory as requested by the clinical team. IMPRESSION: Successful ultrasound guided right thoracentesis yielding 1.2 L of pleural fluid. Electronically Signed   By: Jerilynn Mages.  Shick M.D.   On: 02/07/2019 10:10     ASSESSMENT AND PLAN:   78 year old female with past medical history of diabetes, hypertension who presents to the hospital due to shortness of breath and weakness and noted to be severely hyponatremic.  1.  Acute respiratory failure with hypoxia-secondary to a right-sided pleural effusion. -Patient underwent CT of the chest which showed a moderate to large right-sided pleural effusion with associated consolidation. -Status post ultrasound-guided thoracentesis yesterday with 1.3 L of fluid removed.  Fluid analysis consistent with a transudative effusion.  Continue O2 supplementation.  Await  cytology on the pleural fluid. - Pulmonary has also initiated chest physiotherapy to help mobilize secretions.  2.  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 -Seen by nephrology and status post 3% saline and sodium has now normalized.  3% saline has now been discontinued. -Continue to hold diuretics.  3. Essential HTN - cont. Atenolol, Vasotec.  - BP stable.   4. DM Type II w/out complication - cont. SSI and follow BS  5. GERD - cont. Protonix.   6.  Altered mental status/lethargy-etiology unclear but suspect to be multifactorial related to underlying hyponatremia, possible underlying dementia/cognitive decline. -Patient underwent an MRI of the brain yesterday which showed no acute pathology.  Patient follows commands but remains lethargic.  Avoid sedatives Meds.  - follow mental status.   All the records are reviewed and case discussed with Care Management/Social Worker. Management plans discussed with the patient, family and they are in agreement.  CODE STATUS: Full code  DVT Prophylaxis: Lovenox  TOTAL TIME TAKING CARE OF THIS PATIENT: 30 minutes.   POSSIBLE D/C unclear  DAYS, DEPENDING ON CLINICAL CONDITION and progress.   Henreitta Leber M.D on 02/08/2019 at 3:15 PM  Between 7am to 6pm - Pager - [phone]  After 6pm go to [email or web address] - Proofreader  [facility]  Office  [phone]  CC: Primary care physician; Sallee Lange, NP

## 2019-02-08 NOTE — Progress Notes (Signed)
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  She also had a moderate right pleural effusion.   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  She wishes to know significance and prognosis/treatment options. I will consult oncology for further discourse.    PAST MEDICAL HISTORY   Past Medical History:  Diagnosis Date   Diabetes mellitus without complication ([place])    Hypertension      SURGICAL HISTORY   Past Surgical History:  Procedure Laterality Date   ABDOMINAL HYSTERECTOMY     CHOLECYSTECTOMY       FAMILY HISTORY   History reviewed. No pertinent family history.   SOCIAL HISTORY   Social History   Tobacco Use   Smoking status: Former Smoker   Smokeless tobacco: Never Used  Substance Use Topics   Alcohol use: Not Currently   Drug use: Not Currently     MEDICATIONS   Current Medication:  Current Facility-Administered Medications:    atenolol (TENORMIN) tablet 50 mg, 50 mg, Oral, Daily, Ojie, Jude, MD, 50 mg at 02/06/19 1011   enalapril (VASOTEC) tablet 20 mg, 20 mg, Oral, BID, Ojie, Jude, MD, 20 mg at 02/07/19 2136   enoxaparin (LOVENOX) injection 40 mg, 40 mg, Subcutaneous, Q24H, Sainani, Vivek J, MD, 40 mg at 02/07/19 2133    hydrALAZINE (APRESOLINE) injection 10 mg, 10 mg, Intravenous, Q4H PRN, Lance Coon, MD, 10 mg at 02/05/19 2318   insulin aspart (novoLOG) injection 0-5 Units, 0-5 Units, Subcutaneous, QHS, Lance Coon, MD   insulin aspart (novoLOG) injection 0-9 Units, 0-9 Units, Subcutaneous, TID WC, Lance Coon, MD, 1 Units at 02/04/19 1713   ondansetron (ZOFRAN) tablet 4 mg, 4 mg, Oral, Q6H PRN **OR** ondansetron (ZOFRAN) injection 4 mg, 4 mg, Intravenous, Q6H PRN, Lance Coon, MD   pantoprazole (PROTONIX) EC tablet 40 mg, 40 mg, Oral, Daily, Lance Coon, MD, 40 mg at 02/06/19 1011   traZODone (DESYREL) tablet 50 mg, 50 mg, Oral, QHS PRN, Lance Coon, MD, 50 mg at 02/05/19 2206    ALLERGIES   Codeine, Atorvastatin, Citalopram, Keflex [cephalexin], and Tylenol [acetaminophen]    REVIEW OF SYSTEMS    Unable to obtain due to confusion  PHYSICAL EXAMINATION   Vitals:   02/08/19 0700 02/08/19 0800  BP:  (!) 127/59  Pulse: 82 85  Resp: 16 16  Temp:  98.2 F (36.8 C)  SpO2: 99% 100%    GENERAL:nad HEAD: Normocephalic, atraumatic.  EYES: Pupils equal, round, reactive to light.  No scleral icterus.  MOUTH: Moist mucosal membrane. NECK: Supple. No thyromegaly. No nodules. No JVD.  PULMONARY: decreased bs at right lung CARDIOVASCULAR: S1 and S2. Regular rate and rhythm. No murmurs, rubs, or gallops.  GASTROINTESTINAL: Soft, nontender, non-distended. No masses. Positive bowel sounds. No hepatosplenomegaly.  MUSCULOSKELETAL: No swelling, clubbing, or edema.  NEUROLOGIC: Mild distress due to acute illness SKIN:intact,warm,dry   LABS AND IMAGING       LAB RESULTS:     Recent Labs  Lab 02/05/19 0556  02/07/19 0454  02/07/19 2118 02/08/19 0118 02/08/19 0539  NA 117*   < > 130*   < > 132* 132* 133*  K 3.9  --  4.2  --   --   --  4.2  CL 79*  --  94*  --   --   --  95*  CO2 30  --  30  --   --   --  30  BUN 7*  --  13  --   --   --  14  CREATININE 0.39*  --  0.50   --   --   --  0.50  GLUCOSE 119*  --  129*  --   --   --  109*   < > = values in this interval not displayed.   Recent Labs  Lab 02/06/19 0539 02/07/19 0454 02/08/19 0539  HGB 12.0 12.0 11.3*  HCT 37.5 39.2 36.9  WBC 6.9 6.3 6.3  PLT 264 270 240     IMAGING RESULTS: Mr Brain Wo Contrast  Result Date: 02/07/2019 CLINICAL DATA:  Initial evaluation for acute confusion, encephalopathy. EXAM: MRI HEAD WITHOUT CONTRAST TECHNIQUE: Multiplanar, multiecho pulse sequences of the brain and surrounding structures were obtained without intravenous contrast. COMPARISON:  None available. FINDINGS: Brain: Examination mildly degraded by motion artifact. Diffuse prominence of the CSF containing spaces compatible with generalized age-related cerebral atrophy. Mild scattered patchy T2/FLAIR hyperintensity within the periventricular deep white matter both cerebral hemispheres, most consistent with chronic microvascular ischemic disease, mild for age. No abnormal foci of restricted diffusion to suggest acute or subacute ischemia. Gray-white matter differentiation maintained. No encephalomalacia to suggest chronic cortical infarction. No foci of susceptibility artifact to suggest acute or chronic intracranial hemorrhage. No mass lesion, midline shift or mass effect. No hydrocephalus. The extra-axial fluid collection. Pituitary gland within normal limits. Midline structures intact. Vascular: Major intracranial vascular flow voids are maintained Skull and upper cervical spine: Craniocervical junction within normal limits. Multilevel degenerative spondylolysis noted within the visualized upper cervical spine without significant stenosis. Bone marrow signal intensity normal. No scalp soft tissue abnormality. Sinuses/Orbits: Globes and orbital soft tissues within normal limits. Paranasal sinuses are clear. Left mastoid effusion noted. Inner ear structures grossly normal. Other: None. IMPRESSION: 1. No acute intracranial  abnormality. 2. Mild age-related cerebral atrophy with chronic microvascular ischemic disease. 3. Left mastoid effusion, of uncertain significance. Correlation with physical exam and symptomatology for possible otomastoiditis recommended. Electronically Signed   By: Jeannine Boga M.D.   On: 02/07/2019 20:33   Dg Chest Port 1 View  Result Date: 02/07/2019 CLINICAL DATA:  Status post right-sided thoracentesis. EXAM: PORTABLE CHEST 1 VIEW COMPARISON:  02/05/2019 FINDINGS: The heart is enlarged but stable. Stable tortuosity and calcification of the thoracic aorta. The right PICC line is stable. Status post right-sided thoracentesis with interval decrease in size of the right pleural effusion. No postprocedural pneumothorax is identified. Small left pleural effusion and bibasilar atelectasis. IMPRESSION: Status post right-sided thoracentesis with near complete evacuation of the right-sided pleural effusion. No postprocedural pneumothorax. Persistent small left effusion and moderate bibasilar atelectasis. Electronically Signed   By: Marijo Sanes M.D.   On: 02/07/2019 10:33   US Thoracentesis Asp Pleural Space W/img Guide  Result Date: 02/07/2019 INDICATION: Right pleural effusion, hypertension, diabetes EXAM: ULTRASOUND GUIDED RIGHT THORACENTESIS MEDICATIONS: 1% lidocaine local COMPLICATIONS: None immediate. PROCEDURE: An ultrasound guided thoracentesis was thoroughly discussed with the patient and questions answered. The benefits, risks, alternatives and complications  were also discussed. The patient understands and wishes to proceed with the procedure. Written consent was obtained. Ultrasound was performed to localize and mark an adequate pocket of fluid in the right chest. The area was then prepped and draped in the normal sterile fashion. 1% Lidocaine was used for local anesthesia. Under ultrasound guidance a 6 Fr Safe-T-Centesis catheter was introduced. Thoracentesis was performed. The catheter was  removed and a dressing applied. FINDINGS: A total of approximately 1.2 L of amber colored pleural fluid was removed. Samples were sent to the laboratory as requested by the clinical team. IMPRESSION: Successful ultrasound guided right thoracentesis yielding 1.2 L of pleural fluid. Electronically Signed   By: Jerilynn Mages.  Shick M.D.   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 as per pleural fluid stuides -stopped 3NS , can keep PICC for now - carefully monitoring Na - diagnostic workup in process -for MRI brain - essentially normal  -family meeting today -discussed underlying cause, answered all questions - plan for oncology eval to discuss with family diagnosis/prognosis/treatment options    Right pleural effusion - moderate  - s/p thoracentesis   -O w weaning     Neurology - encephalopathy due to severe hyponatremia - improved now able to verbally communicate and started eating PO today     GI/Nutrition GI PROPHYLAXIS as indicated DIET-->TF's as tolerated Constipation protocol as indicated  ENDO - ICU hypoglycemic\Hyperglycemia protocol -check FSBS per protocol   ELECTROLYTES -follow labs as needed -replace as needed -pharmacy consultation   DVT/GI PRX ordered -SCDs  TRANSFUSIONS AS NEEDED MONITOR FSBS ASSESS the need for LABS as needed   Critical care provider statement:    Critical care time (minutes):  112   Critical care time was exclusive of:  Separately billable procedures and treating other patients   Critical care was necessary to treat or prevent imminent or life-threatening deterioration of the following conditions:  Altered mental status, severe hyponatremia, DM, Right pleural effusion, multiple comorbid conditions.    Critical care was time spent personally by me on the following activities:   Development of treatment plan with patient or surrogate, discussions with consultants, evaluation of patient's response to treatment, examination of patient, obtaining history from patient or surrogate, ordering and performing treatments and interventions, ordering and review of laboratory studies and re-evaluation of patient's condition.  I assumed direction of critical care for this patient from another provider in my specialty: no    This document was prepared using Dragon voice recognition software and may include unintentional dictation errors.    Ottie Glazier, M.D.  Division of [place]

## 2019-02-08 NOTE — Consult Note (Signed)
Hematology/Oncology Consult note [facility] [phone] [phone]  Patient Care Team: Sallee Lange, NP as PCP - General (Internal Medicine)   Name of the patient: Cathy Buck  [id number]  [birth year]   Date of visit: 02/08/19 REASON FOR COSULTATION:  Treatment options for malignant pleural effusion- History of presenting illness-  78 y.o. female with PMH listed at below who presents to ER for evaluation of weakness.  Progressively worsened for a few days prior to presenting to ER. In ED work-up showed profound hyponatremia with a sodium of 113.  Patient was on Lasix and HCTZ. Serum osm was hypotonic.  CT chest reviewed by me showed moderate-large  right pleural effusion with associated consolidation of right lower lobe. Prominent mediastinal lymph nodes.   MRI brain without contrast showed no acute intracranial abnormality.  Cerebral atrophy/chronic microvascular ischemic disease/left mastoid effusion  Patient was admitted to ICU for IV hypertonic saline infusion to correct sodium.  Patient was seen at bedside. She is confused, not able to provide history.  Medical history was obtained via reviewing medical records.   Review of Systems  Unable to perform ROS: Mental status change    Allergies  Allergen Reactions   Codeine Hives   Atorvastatin Other (See Comments)   Citalopram Other (See Comments)    Nausea   Keflex [Cephalexin] Diarrhea   Tylenol [Acetaminophen] Hives    Patient Active Problem List   Diagnosis Date Noted   Hyponatremia 02/03/2019   HTN (hypertension) 02/03/2019   Diabetes ([place]) 02/03/2019     Past Medical History:  Diagnosis Date   Diabetes mellitus without complication ([place])    Hypertension      Past Surgical History:  Procedure Laterality Date   ABDOMINAL HYSTERECTOMY     CHOLECYSTECTOMY      Social History   Socioeconomic History   Marital status: Married    Spouse  name: Not on file   Number of children: Not on file   Years of education: Not on file   Highest education level: Not on file  Occupational History   Not on file  Social Needs   Financial resource strain: Not hard at all   Food insecurity    Worry: Never true    Inability: Never true   Transportation needs    Medical: No    Non-medical: No  Tobacco Use   Smoking status: Former Smoker   Smokeless tobacco: Never Used  Substance and Sexual Activity   Alcohol use: Not Currently   Drug use: Not Currently   Sexual activity: Not Currently  Lifestyle   Physical activity    Days per week: 0 days    Minutes per session: 0 min   Stress: Not at all  Relationships   Social connections    Talks on phone: Once a week    Gets together: Once a week    Attends religious service: 1 to 4 times per year    Active member of club or organization: No    Attends meetings of clubs or organizations: Never    Relationship status: Married   Intimate partner violence    Fear of current or ex partner: No    Emotionally abused: No    Physically abused: No    Forced sexual activity: No  Other Topics Concern   Not on file  Social History Narrative   Lives at home with husband.  Uses cane, walker and rollator a times     History reviewed.  No pertinent family history.   Current Facility-Administered Medications:    atenolol (TENORMIN) tablet 50 mg, 50 mg, Oral, Daily, Ojie, Jude, MD, 50 mg at 02/08/19 0919   enalapril (VASOTEC) tablet 20 mg, 20 mg, Oral, BID, Ojie, Jude, MD, 20 mg at 02/08/19 2139   enoxaparin (LOVENOX) injection 40 mg, 40 mg, Subcutaneous, BID, Charlett Nose, RPH, 40 mg at 02/08/19 2138   hydrALAZINE (APRESOLINE) injection 10 mg, 10 mg, Intravenous, Q4H PRN, Lance Coon, MD, 10 mg at 02/05/19 2318   insulin aspart (novoLOG) injection 0-5 Units, 0-5 Units, Subcutaneous, QHS, Lance Coon, MD   insulin aspart (novoLOG) injection 0-9 Units, 0-9 Units,  Subcutaneous, TID WC, Lance Coon, MD, 1 Units at 02/04/19 1713   ipratropium-albuterol (DUONEB) 0.5-2.5 (3) MG/3ML nebulizer solution 3 mL, 3 mL, Nebulization, Q4H PRN, Lanney Gins, Fuad, MD   ondansetron (ZOFRAN) tablet 4 mg, 4 mg, Oral, Q6H PRN **OR** ondansetron (ZOFRAN) injection 4 mg, 4 mg, Intravenous, Q6H PRN, Lance Coon, MD   pantoprazole (PROTONIX) EC tablet 40 mg, 40 mg, Oral, Daily, Lance Coon, MD, 40 mg at 02/08/19 0919   sodium chloride flush (NS) 0.9 % injection 10-40 mL, 10-40 mL, Intracatheter, Q12H, Lateef, Munsoor, MD, 10 mL at 02/08/19 2141   sodium chloride flush (NS) 0.9 % injection 10-40 mL, 10-40 mL, Intracatheter, PRN, Lateef, Munsoor, MD   traZODone (DESYREL) tablet 50 mg, 50 mg, Oral, QHS PRN, Lance Coon, MD, 50 mg at 02/05/19 2206   Physical exam:  Vitals:   02/08/19 1800 02/08/19 1900 02/08/19 2000 02/08/19 2139  BP: (!) 142/61  (!) 152/81 (!) 150/63  Pulse: 69 75 71   Resp: 18 18 (!) 22   Temp:   97.9 F (36.6 C)   TempSrc:   Oral   SpO2: 96% 97% 97%   Weight:      Height:       Physical Exam  Constitutional:  Lying in bed, no acute distress.  HENT:  Head: Normocephalic and atraumatic.  Eyes: Pupils are equal, round, and reactive to light. EOM are normal.  Neck: Normal range of motion. Neck supple.  Cardiovascular: Normal rate and regular rhythm.  Pulmonary/Chest: Effort normal. No respiratory distress.  Decreased breath sound  Abdominal: Soft.  Musculoskeletal: Normal range of motion.        General: No edema.  Neurological: She is alert.  Confused.  She moves all 4 extremities. She is orientated to her name, place, year.  Not able to have meaningful conversation  Skin: Skin is warm.        CMP Latest Ref Rng & Units 02/08/2019  Glucose 70 - 99 mg/dL -  BUN 8 - 23 mg/dL -  Creatinine 0.44 - 1.00 mg/dL -  Sodium 135 - 145 mmol/L 132(L)  Potassium 3.5 - 5.1 mmol/L -  Chloride 98 - 111 mmol/L -  CO2 22 - 32 mmol/L -    Calcium 8.9 - 10.3 mg/dL -  Total Protein 6.5 - 8.1 g/dL -  Total Bilirubin 0.3 - 1.2 mg/dL -  Alkaline Phos 38 - 126 U/L -  AST 15 - 41 U/L -  ALT 0 - 44 U/L -   CBC Latest Ref Rng & Units 02/08/2019  WBC 4.0 - 10.5 K/uL 6.3  Hemoglobin 12.0 - 15.0 g/dL 11.3(L)  Hematocrit 36.0 - 46.0 % 36.9  Platelets 150 - 400 K/uL 240   RADIOGRAPHIC STUDIES: I have personally reviewed the radiological images as listed and agreed with the findings in the  report.  Ct Chest Wo Contrast  Result Date: 02/04/2019 CLINICAL DATA:  Shortness of breath EXAM: CT CHEST WITHOUT CONTRAST TECHNIQUE: Multidetector CT imaging of the chest was performed following the standard protocol without IV contrast. COMPARISON:  Plain film from previous day FINDINGS: Cardiovascular: Somewhat limited due to lack of IV contrast. Aortic calcifications are noted without significant aneurysmal dilatation. No cardiac enlargement is seen. No pericardial effusion is noted. Mediastinum/Nodes: The esophagus is within normal limits. No definitive hilar adenopathy is noted. There is a right paratracheal node which measures approximately 16 mm in short axis as well as a subcarinal node which measures 17 mm in short axis. These are likely reactive in nature. Thoracic inlet is within normal limits. Lungs/Pleura: The left lung is well aerated with minimal atelectatic changes in the lingula along the cardiac border. No sizable effusion is noted. The right hemithorax demonstrates a moderate to large pleural effusion increased from the prior exam. Underlying patchy infiltrate is noted throughout the right lung with more marked consolidation in the right lung base. No definitive nodule is noted. Upper Abdomen: Somewhat limited due to lack of IV contrast. No definitive abnormality is noted. Musculoskeletal: Mild degenerative change of the thoracic spine is noted. No acute bony abnormality is seen. IMPRESSION: Moderate to large right-sided pleural effusion  with associated consolidation most marked in the right lower lobe. Mild left basilar atelectasis. Prominent mediastinal lymph nodes likely reactive in nature. Aortic Atherosclerosis (ICD10-I70.0). Electronically Signed   By: Inez Catalina M.D.   On: 02/04/2019 15:36   Mr Brain Wo Contrast  Result Date: 02/07/2019 CLINICAL DATA:  Initial evaluation for acute confusion, encephalopathy. EXAM: MRI HEAD WITHOUT CONTRAST TECHNIQUE: Multiplanar, multiecho pulse sequences of the brain and surrounding structures were obtained without intravenous contrast. COMPARISON:  None available. FINDINGS: Brain: Examination mildly degraded by motion artifact. Diffuse prominence of the CSF containing spaces compatible with generalized age-related cerebral atrophy. Mild scattered patchy T2/FLAIR hyperintensity within the periventricular deep white matter both cerebral hemispheres, most consistent with chronic microvascular ischemic disease, mild for age. No abnormal foci of restricted diffusion to suggest acute or subacute ischemia. Gray-white matter differentiation maintained. No encephalomalacia to suggest chronic cortical infarction. No foci of susceptibility artifact to suggest acute or chronic intracranial hemorrhage. No mass lesion, midline shift or mass effect. No hydrocephalus. The extra-axial fluid collection. Pituitary gland within normal limits. Midline structures intact. Vascular: Major intracranial vascular flow voids are maintained Skull and upper cervical spine: Craniocervical junction within normal limits. Multilevel degenerative spondylolysis noted within the visualized upper cervical spine without significant stenosis. Bone marrow signal intensity normal. No scalp soft tissue abnormality. Sinuses/Orbits: Globes and orbital soft tissues within normal limits. Paranasal sinuses are clear. Left mastoid effusion noted. Inner ear structures grossly normal. Other: None. IMPRESSION: 1. No acute intracranial abnormality. 2.  Mild age-related cerebral atrophy with chronic microvascular ischemic disease. 3. Left mastoid effusion, of uncertain significance. Correlation with physical exam and symptomatology for possible otomastoiditis recommended. Electronically Signed   By: Jeannine Boga M.D.   On: 02/07/2019 20:33   Dg Chest Port 1 View  Result Date: 02/08/2019 CLINICAL DATA:  Followup pleural effusion. EXAM: PORTABLE CHEST 1 VIEW COMPARISON:  02/07/2019 and earlier exams. FINDINGS: Opacity at the right lung base mostly obscures hemidiaphragm consistent with a small effusion and atelectasis. Opacity at the left lung base is less prominent than on the previous day's study, the difference likely due to differences in patient positioning only. There is hazy opacity consistent with a smaller  left pleural effusion. No new lung abnormalities. No evidence of pulmonary edema. No pneumothorax. Right-sided PICC is stable. IMPRESSION: 1. No significant change from the most recent prior study allowing for differences in patient positioning. 2. Small residual right pleural effusion with associated atelectasis. Smaller left pleural effusion with atelectasis. No convincing pulmonary edema. No pneumothorax. Electronically Signed   By: Lajean Manes M.D.   On: 02/08/2019 13:25   Dg Chest Port 1 View  Result Date: 02/07/2019 CLINICAL DATA:  Status post right-sided thoracentesis. EXAM: PORTABLE CHEST 1 VIEW COMPARISON:  02/05/2019 FINDINGS: The heart is enlarged but stable. Stable tortuosity and calcification of the thoracic aorta. The right PICC line is stable. Status post right-sided thoracentesis with interval decrease in size of the right pleural effusion. No postprocedural pneumothorax is identified. Small left pleural effusion and bibasilar atelectasis. IMPRESSION: Status post right-sided thoracentesis with near complete evacuation of the right-sided pleural effusion. No postprocedural pneumothorax. Persistent small left effusion and  moderate bibasilar atelectasis. Electronically Signed   By: Marijo Sanes M.D.   On: 02/07/2019 10:33   Dg Chest Port 1 View  Result Date: 02/07/2019 CLINICAL DATA:  Shortness of breath. Patient experiencing increased shortness of breath. EXAM: PORTABLE CHEST 1 VIEW COMPARISON:  Portable chest radiograph 02/05/2019 FINDINGS: Unchanged position of a right-sided PICC with tip projecting over the upper right atrium. May consider withdrawing catheter 2.5-3 cm to place tip cavoatrial junction. The cardiomediastinal silhouette is unchanged. Interval increase in size of a right pleural effusion with increasing right basilar atelectasis. Underlying right basilar pneumonia cannot be excluded. Ill-defined opacity throughout the mid to lower right lung has also increased and may reflect incomplete atelectasis and/or developing pneumonia. Mild left basilar atelectasis is unchanged. No evidence of pneumothorax. Degenerative changes of the spine. IMPRESSION: Unchanged position of a right PICC with tip projecting over the right atrium. Consider withdrawing catheter 2.5-3 cm to place tip at cavoatrial junction. Interval increase in size of a right pleural effusion with increasing right basilar atelectasis. Underlying right basilar pneumonia cannot be excluded. Interval increase in ill-defined opacity throughout the mid to lower right lung may reflect incomplete atelectasis and/or developing pneumonia. Electronically Signed   By: Kellie Simmering   On: 02/07/2019 08:14   Dg Chest Port 1 View  Result Date: 02/05/2019 CLINICAL DATA:  PICC line placement EXAM: PORTABLE CHEST 1 VIEW COMPARISON:  Portable exam 2225 hours compared to 1631 hours FINDINGS: RIGHT arm PICC line with tip projecting over superior RIGHT atrium. May consider withdrawing catheter 2.5-3.0 cm to place tip at cavoatrial junction. Stable heart size and mediastinal contours. RIGHT pleural effusion and basilar atelectasis with questionable RIGHT perihilar  infiltrate. Minimal LEFT base atelectasis. IMPRESSION: Tip of RIGHT arm PICC line projects over RIGHT atrium; recommend withdrawal 2.5 x 3.0 cm to place tip at approximately the cavoatrial junction. Otherwise no interval change. Electronically Signed   By: Lavonia Dana M.D.   On: 02/05/2019 22:34   Dg Chest Port 1 View  Result Date: 02/05/2019 CLINICAL DATA:  PICC line placement EXAM: PORTABLE CHEST 1 VIEW COMPARISON:  Portable exam 1613 hours compared to 02/03/2019 FINDINGS: RIGHT arm PICC line tip deflects into the azygos arch. Upper normal heart size. Mediastinal contours and pulmonary vascularity normal. Atherosclerotic calcification aorta. RIGHT lung infiltrate with small RIGHT pleural effusion and basilar atelectasis. LEFT lung clear. No pneumothorax or acute osseous findings. IMPRESSION: Tip of RIGHT arm PICC line deflects into the azygos arch, recommend repositioning into SVC. Increased RIGHT lung infiltrates, RIGHT pleural  effusion and basilar atelectasis. Findings called to [facility] in ICU on 02/05/2019 at 1839 hrs. Electronically Signed   By: Lavonia Dana M.D.   On: 02/05/2019 18:40   Dg Chest Portable 1 View  Result Date: 02/03/2019 CLINICAL DATA:  Shortness of breath. EXAM: PORTABLE CHEST 1 VIEW COMPARISON:  None. FINDINGS: The heart size and pulmonary vascularity are normal. Aortic atherosclerosis. Small right pleural effusion. Focal linear atelectasis in the right midzone. No acute bone abnormality. IMPRESSION: 1. Small right pleural effusion. Slight atelectasis in the right midzone. 2. Aortic atherosclerosis. Electronically Signed   By: Lorriane Shire M.D.   On: 02/03/2019 18:36   [place] Ekg Site Rite  Result Date: 02/05/2019 If Site Rite image not attached, placement could not be confirmed due to current cardiac rhythm.  US Thoracentesis Asp Pleural Space W/img Guide  Result Date: 02/07/2019 INDICATION: Right pleural effusion, hypertension, diabetes EXAM: ULTRASOUND GUIDED RIGHT  THORACENTESIS MEDICATIONS: 1% lidocaine local COMPLICATIONS: None immediate. PROCEDURE: An ultrasound guided thoracentesis was thoroughly discussed with the patient and questions answered. The benefits, risks, alternatives and complications were also discussed. The patient understands and wishes to proceed with the procedure. Written consent was obtained. Ultrasound was performed to localize and mark an adequate pocket of fluid in the right chest. The area was then prepped and draped in the normal sterile fashion. 1% Lidocaine was used for local anesthesia. Under ultrasound guidance a 6 Fr Safe-T-Centesis catheter was introduced. Thoracentesis was performed. The catheter was removed and a dressing applied. FINDINGS: A total of approximately 1.2 L of amber colored pleural fluid was removed. Samples were sent to the laboratory as requested by the clinical team. IMPRESSION: Successful ultrasound guided right thoracentesis yielding 1.2 L of pleural fluid. Electronically Signed   By: Jerilynn Mages.  Shick M.D.   On: 02/07/2019 10:10    Assessment and plan- Patient is a 78 y.o. female with history of diabetes and hypertension, former smoker currently admitted to ICU due to hyponatremia.  CT chest reviewed moderate to large right pleural effusion associated with consolidation of right lower lobe.  Prominent mediastinal lymph nodes.  #Hyponatremia, likely SIADH, sodium level has improved to 133, currently off hypertonic infusion. #Right pleural effusion, status post ultrasound guided thoracentesis, with removal of 1.2 L of amber-colored pleural effusion.  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 Official report has not been signed out yet. Flowcytometry is pending.  Mediastinal lymphadenopathy, reactive vs metastatic.  No family member at bedside. Awaiting official results and will discuss with family about treatment options. Once tissue diagnosis is established, recommend completing staging,  CT abd or PET.  Check CA125, CA 19.9, CEA.    Thank you for allowing me to participate in the care of this patient.  Total face to face encounter time for this patient visit was 70 min. >50% of the time was  spent in counseling and coordination of care.    Earlie Server, MD, PhD Hematology Oncology [facility] Pager- [phone] 02/08/2019

## 2019-02-09 LAB — COMP PANEL: LEUKEMIA/LYMPHOMA

## 2019-02-09 LAB — PHOSPHORUS
Phosphorus: 1.5 mg/dL — ABNORMAL LOW (ref 2.5–4.6)
Phosphorus: 2.3 mg/dL — ABNORMAL LOW (ref 2.5–4.6)

## 2019-02-09 LAB — BASIC METABOLIC PANEL
Anion gap: 8 (ref 5–15)
BUN: 13 mg/dL (ref 8–23)
CO2: 32 mmol/L (ref 22–32)
Calcium: 9.2 mg/dL (ref 8.9–10.3)
Chloride: 94 mmol/L — ABNORMAL LOW (ref 98–111)
Creatinine, Ser: 0.4 mg/dL — ABNORMAL LOW (ref 0.44–1.00)
GFR calc Af Amer: >60 mL/min (ref >60)
GFR calc non Af Amer: >60 mL/min (ref >60)
Glucose, Bld: 103 mg/dL — ABNORMAL HIGH (ref 70–99)
Potassium: 3.7 mmol/L (ref 3.5–5.1)
Sodium: 134 mmol/L — ABNORMAL LOW (ref 135–145)

## 2019-02-09 LAB — SODIUM
Sodium: 133 mmol/L — ABNORMAL LOW (ref 135–145)
Sodium: 135 mmol/L (ref 135–145)
Sodium: 133 mmol/L — ABNORMAL LOW (ref 135–145)
Sodium: 132 mmol/L — ABNORMAL LOW (ref 135–145)
Sodium: 131 mmol/L — ABNORMAL LOW (ref 135–145)

## 2019-02-09 LAB — GLUCOSE, CAPILLARY
Glucose-Capillary: 112 mg/dL — ABNORMAL HIGH (ref 70–99)
Glucose-Capillary: 125 mg/dL — ABNORMAL HIGH (ref 70–99)
Glucose-Capillary: 99 mg/dL (ref 70–99)
Glucose-Capillary: 136 mg/dL — ABNORMAL HIGH (ref 70–99)

## 2019-02-09 LAB — MAGNESIUM: Magnesium: 1.9 mg/dL (ref 1.7–2.4)

## 2019-02-09 LAB — CHOLESTEROL, BODY FLUID: Cholesterol, Fluid: 60 mg/dL

## 2019-02-09 MED ORDER — POTASSIUM PHOSPHATES 15 MMOLE/5ML IV SOLN
30.0000 mmol | Freq: Once | INTRAVENOUS | Status: AC
  Administered 2019-02-09: 30 mmol via INTRAVENOUS
  Filled 2019-02-09: qty 10

## 2019-02-09 MED ORDER — SODIUM PHOSPHATES 45 MMOLE/15ML IV SOLN
10.0000 mmol | Freq: Once | INTRAVENOUS | Status: AC
  Administered 2019-02-09: 10 mmol via INTRAVENOUS
  Filled 2019-02-09: qty 3.33

## 2019-02-09 NOTE — Progress Notes (Signed)
Pharmacy Electrolyte Monitoring Consult:  Pharmacy consulted to assist in monitoring and replacing electrolytes in this 78 y.o. 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   Labs:  Sodium (mmol/L)  Date Value  02/09/2019 133 (L)   Potassium (mmol/L)  Date Value  02/09/2019 3.7   Magnesium (mg/dL)  Date Value  02/09/2019 1.9   Phosphorus (mg/dL)  Date Value  02/09/2019 2.3 (L)   Calcium (mg/dL)  Date Value  02/09/2019 9.2   Albumin (g/dL)  Date Value  02/08/2019 3.1 (L)    Assessment/Plan: Hypertonic saline stopped on 7/27. Patient received sodium phosphate 38mmol IV x 1 on 7/28.   Potassium phosphate 48mmol X 1. Will order additional sodium phosphate 23mmol IV x 1.   BMP/Magnesium/Phosphorus with am labs.   Will replace to maintain electrolytes within normal limits.   Pharmacy will continue to monitor and adjust per consult.   Lakasha Mcfall L 02/09/2019 2:47 PM

## 2019-02-09 NOTE — Progress Notes (Signed)
[facility] NAME: Cathy Buck    MR#:  [id number]  DATE OF BIRTH:  01/23/1941  SUBJECTIVE:   Sodium level has normalized, mental status slightly improved and patient is following commands.  No other acute events overnight.  Transfer to the floor today.  REVIEW OF SYSTEMS:    Review of Systems  Unable to perform ROS: Mental acuity    Nutrition: Heart healthy/carb modified Tolerating Diet:No due to lethargy Tolerating PT: Await Eval.    DRUG ALLERGIES:   Allergies  Allergen Reactions  . Codeine Hives  . Atorvastatin Other (See Comments)  . Citalopram Other (See Comments)    Nausea  . Keflex [Cephalexin] Diarrhea  . Tylenol [Acetaminophen] Hives    VITALS:  Blood pressure 139/68, pulse 73, temperature (!) 97 F (36.1 C), temperature source Axillary, resp. rate 16, height 5\' 4"  (1.626 m), weight 108 kg, SpO2 (!) 88 %.  PHYSICAL EXAMINATION:   Physical Exam  GENERAL:  78 y.o.-year-old patient lying in bed lethargic but follows simple commands.  EYES: Pupils equal, round, reactive to light and accommodation. No scleral icterus. Extraocular muscles intact.  HEENT: Head atraumatic, normocephalic. Oropharynx and nasopharynx clear.  NECK:  Supple, no jugular venous distention. No thyroid enlargement, no tenderness.  LUNGS: Good a/e b/l,  no wheezing, rales, rhonchi. No use of accessory muscles of respiration.  CARDIOVASCULAR: S1, S2 normal. No murmurs, rubs, or gallops.  ABDOMEN: Soft, nontender, nondistended. Bowel sounds present. No organomegaly or mass.  EXTREMITIES: No cyanosis, clubbing or edema b/l.    NEUROLOGIC: Cranial nerves II through XII are intact. No focal Motor or sensory deficits b/l. Globally weak.   PSYCHIATRIC: The patient is alert and oriented x 3.  SKIN: No obvious rash, lesion, or ulcer.    LABORATORY PANEL:   CBC Recent Labs  Lab 02/08/19 0539  WBC 6.3  HGB 11.3*  HCT 36.9  PLT 240    ------------------------------------------------------------------------------------------------------------------  Chemistries  Recent Labs  Lab 02/08/19 0539  02/09/19 0402  02/09/19 1319  NA 133*   < > 134*   < > 133*  K 4.2  --  3.7  --   --   CL 95*  --  94*  --   --   CO2 30  --  32  --   --   GLUCOSE 109*  --  103*  --   --   BUN 14  --  13  --   --   CREATININE 0.50  --  0.40*  --   --   CALCIUM 9.3  --  9.2  --   --   MG 2.0  --  1.9  --   --   AST 17  --   --   --   --   ALT 19  --   --   --   --   ALKPHOS 51  --   --   --   --   BILITOT 0.8  --   --   --   --    < > = values in this interval not displayed.   ------------------------------------------------------------------------------------------------------------------  Cardiac Enzymes No results for input(s): TROPONINI in the last 168 hours. ------------------------------------------------------------------------------------------------------------------  RADIOLOGY:  Mr Brain Wo Contrast  Result Date: 02/07/2019 CLINICAL DATA:  Initial evaluation for acute confusion, encephalopathy. EXAM: MRI HEAD WITHOUT CONTRAST TECHNIQUE: Multiplanar, multiecho pulse sequences of the brain and surrounding structures were obtained without intravenous  contrast. COMPARISON:  None available. FINDINGS: Brain: Examination mildly degraded by motion artifact. Diffuse prominence of the CSF containing spaces compatible with generalized age-related cerebral atrophy. Mild scattered patchy T2/FLAIR hyperintensity within the periventricular deep white matter both cerebral hemispheres, most consistent with chronic microvascular ischemic disease, mild for age. No abnormal foci of restricted diffusion to suggest acute or subacute ischemia. Gray-white matter differentiation maintained. No encephalomalacia to suggest chronic cortical infarction. No foci of susceptibility artifact to suggest acute or chronic intracranial hemorrhage. No mass lesion,  midline shift or mass effect. No hydrocephalus. The extra-axial fluid collection. Pituitary gland within normal limits. Midline structures intact. Vascular: Major intracranial vascular flow voids are maintained Skull and upper cervical spine: Craniocervical junction within normal limits. Multilevel degenerative spondylolysis noted within the visualized upper cervical spine without significant stenosis. Bone marrow signal intensity normal. No scalp soft tissue abnormality. Sinuses/Orbits: Globes and orbital soft tissues within normal limits. Paranasal sinuses are clear. Left mastoid effusion noted. Inner ear structures grossly normal. Other: None. IMPRESSION: 1. No acute intracranial abnormality. 2. Mild age-related cerebral atrophy with chronic microvascular ischemic disease. 3. Left mastoid effusion, of uncertain significance. Correlation with physical exam and symptomatology for possible otomastoiditis recommended. Electronically Signed   By: Jeannine Boga M.D.   On: 02/07/2019 20:33   Dg Chest Port 1 View  Result Date: 02/08/2019 CLINICAL DATA:  Followup pleural effusion. EXAM: PORTABLE CHEST 1 VIEW COMPARISON:  02/07/2019 and earlier exams. FINDINGS: Opacity at the right lung base mostly obscures hemidiaphragm consistent with a small effusion and atelectasis. Opacity at the left lung base is less prominent than on the previous day's study, the difference likely due to differences in patient positioning only. There is hazy opacity consistent with a smaller left pleural effusion. No new lung abnormalities. No evidence of pulmonary edema. No pneumothorax. Right-sided PICC is stable. IMPRESSION: 1. No significant change from the most recent prior study allowing for differences in patient positioning. 2. Small residual right pleural effusion with associated atelectasis. Smaller left pleural effusion with atelectasis. No convincing pulmonary edema. No pneumothorax. Electronically Signed   By: Lajean Manes  M.D.   On: 02/08/2019 13:25     ASSESSMENT AND PLAN:   78 year old female with past medical history of diabetes, hypertension who presents to the hospital due to shortness of breath and weakness and noted to be severely hyponatremic.  1.  Acute respiratory failure with hypoxia-secondary to a right-sided pleural effusion. -Patient underwent CT of the chest which showed a moderate to large right-sided pleural effusion with associated consolidation. -Status post ultrasound-guided thoracentesis with 1.3 L of fluid removed. -Cytology no fluid pulmonary is consistent with possible adenocarcinoma.  2.  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 -Seen by nephrology and status post 3% saline and sodium has now normalized.  -Nephrology has signed off, sodium level stable.  3.  Pleural effusion- status post thoracentesis.  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  Seen by oncology and await official pathology report before initiating staging imaging and considering treatment.  4. Essential HTN - cont. Atenolol, Vasotec.  - BP stable.   5. DM Type II w/out complication - cont. SSI and follow BS  6. GERD - cont. Protonix.   7.  Altered mental status/lethargy-etiology unclear but suspect to be multifactorial related to underlying hyponatremia, possible underlying dementia/cognitive decline. - MRI was negative for acute pathology and mental status is improving and will cont. To monitor.  Transfer to floor today.    All the records are reviewed and case discussed  with Care Management/Social Worker. Management plans discussed with the patient, family and they are in agreement.  CODE STATUS: Full code  DVT Prophylaxis: Lovenox  TOTAL TIME TAKING CARE OF THIS PATIENT: 30 minutes.   POSSIBLE D/C unclear  DAYS, DEPENDING ON CLINICAL CONDITION and progress.   Henreitta Leber M.D on 02/09/2019 at 3:13 PM  Between 7am to 6pm - Pager - [phone]  After 6pm go to [email or web address] -  Proofreader  [facility]  Office  [phone]  CC: Primary care physician; Sallee Lange, NP

## 2019-02-09 NOTE — Progress Notes (Signed)
EVS worker requested RT to go into room for pt SOB, pt sat in mid upper 80's. Pt given prn tx with good relief. Will continue to monitor

## 2019-02-09 NOTE — Progress Notes (Signed)
Hematology/Oncology Progress Note [facility] [phone] [phone]  Patient Care Team: Sallee Lange, NP as PCP - General (Internal Medicine)   Name of the patient: Cathy Buck  [id number]  1941-07-05  Date of visit: 02/09/19   INTERVAL HISTORY-  Patient is lying in bed.  Remains confused.  Going to be transferred out of ICU today.   Review of systems- Review of Systems  Unable to perform ROS: Mental status change  Patient remains confused.  Allergies  Allergen Reactions   Codeine Hives   Atorvastatin Other (See Comments)   Citalopram Other (See Comments)    Nausea   Keflex [Cephalexin] Diarrhea   Tylenol [Acetaminophen] Hives    Patient Active Problem List   Diagnosis Date Noted   Pleural effusion    Weakness    Hyponatremia 02/03/2019   HTN (hypertension) 02/03/2019   Diabetes ([place]) 02/03/2019     Past Medical History:  Diagnosis Date   Diabetes mellitus without complication ([place])    Hypertension      Past Surgical History:  Procedure Laterality Date   ABDOMINAL HYSTERECTOMY     CHOLECYSTECTOMY      Social History   Socioeconomic History   Marital status: Married    Spouse name: Not on file   Number of children: Not on file   Years of education: Not on file   Highest education level: Not on file  Occupational History   Not on file  Social Needs   Financial resource strain: Not hard at all   Food insecurity    Worry: Never true    Inability: Never true   Transportation needs    Medical: No    Non-medical: No  Tobacco Use   Smoking status: Former Smoker   Smokeless tobacco: Never Used  Substance and Sexual Activity   Alcohol use: Not Currently   Drug use: Not Currently   Sexual activity: Not Currently  Lifestyle   Physical activity    Days per week: 0 days    Minutes per session: 0 min   Stress: Not at all  Relationships   Social connections    Talks  on phone: Once a week    Gets together: Once a week    Attends religious service: 1 to 4 times per year    Active member of club or organization: No    Attends meetings of clubs or organizations: Never    Relationship status: Married   Intimate partner violence    Fear of current or ex partner: No    Emotionally abused: No    Physically abused: No    Forced sexual activity: No  Other Topics Concern   Not on file  Social History Narrative   Lives at home with husband.  Uses cane, walker and rollator a times     History reviewed. No pertinent family history.   Current Facility-Administered Medications:    atenolol (TENORMIN) tablet 50 mg, 50 mg, Oral, Daily, Ojie, Jude, MD, 50 mg at 02/09/19 0912   enalapril (VASOTEC) tablet 20 mg, 20 mg, Oral, BID, Ojie, Jude, MD, 20 mg at 02/09/19 0913   enoxaparin (LOVENOX) injection 40 mg, 40 mg, Subcutaneous, BID, Charlett Nose, RPH, 40 mg at 02/09/19 0347   hydrALAZINE (APRESOLINE) injection 10 mg, 10 mg, Intravenous, Q4H PRN, Lance Coon, MD, 10 mg at 02/05/19 2318   insulin aspart (novoLOG) injection 0-5 Units, 0-5 Units, Subcutaneous, QHS, Lance Coon, MD   insulin aspart (novoLOG) injection  0-9 Units, 0-9 Units, Subcutaneous, TID WC, Lance Coon, MD, 1 Units at 02/04/19 1713   ipratropium-albuterol (DUONEB) 0.5-2.5 (3) MG/3ML nebulizer solution 3 mL, 3 mL, Nebulization, Q4H PRN, Lanney Gins, Fuad, MD, 3 mL at 02/09/19 1008   ondansetron (ZOFRAN) tablet 4 mg, 4 mg, Oral, Q6H PRN **OR** ondansetron (ZOFRAN) injection 4 mg, 4 mg, Intravenous, Q6H PRN, Lance Coon, MD   pantoprazole (PROTONIX) EC tablet 40 mg, 40 mg, Oral, Daily, Lance Coon, MD, 40 mg at 02/09/19 0912   sodium chloride flush (NS) 0.9 % injection 10-40 mL, 10-40 mL, Intracatheter, Q12H, Lateef, Munsoor, MD, 20 mL at 02/09/19 0914   sodium chloride flush (NS) 0.9 % injection 10-40 mL, 10-40 mL, Intracatheter, PRN, Lateef, Munsoor, MD   sodium phosphate 10  mmol in dextrose 5 % 250 mL infusion, 10 mmol, Intravenous, Once, Charlett Nose, RPH   traZODone (DESYREL) tablet 50 mg, 50 mg, Oral, QHS PRN, Lance Coon, MD, 50 mg at 02/05/19 2206   Physical exam:  Vitals:   02/09/19 1008 02/09/19 1100 02/09/19 1200 02/09/19 1515  BP:  128/69 139/68 (!) 154/73  Pulse: 78 71 73 74  Resp: 19 18 16 20   Temp:    99.2 F (37.3 C)  TempSrc:    Oral  SpO2: (!) 88%   99%  Weight:      Height:       Physical Exam  Constitutional: No distress.  HENT:  Head: Normocephalic and atraumatic.  Eyes: Pupils are equal, round, and reactive to light. No scleral icterus.  Neck: Normal range of motion. Neck supple.  Cardiovascular: Normal rate and regular rhythm.  No murmur heard. Pulmonary/Chest: Effort normal. No respiratory distress.  Decreased breath sounds.  Abdominal: Soft. Bowel sounds are normal. She exhibits no distension.  Musculoskeletal: Normal range of motion.        General: No edema.  Neurological: She is alert.  Orientated to name, place, and year.  Skin: Skin is warm and dry. She is not diaphoretic. No erythema.       CMP Latest Ref Rng & Units 02/09/2019  Glucose 70 - 99 mg/dL -  BUN 8 - 23 mg/dL -  Creatinine 0.44 - 1.00 mg/dL -  Sodium 135 - 145 mmol/L 133(L)  Potassium 3.5 - 5.1 mmol/L -  Chloride 98 - 111 mmol/L -  CO2 22 - 32 mmol/L -  Calcium 8.9 - 10.3 mg/dL -  Total Protein 6.5 - 8.1 g/dL -  Total Bilirubin 0.3 - 1.2 mg/dL -  Alkaline Phos 38 - 126 U/L -  AST 15 - 41 U/L -  ALT 0 - 44 U/L -   CBC Latest Ref Rng & Units 02/08/2019  WBC 4.0 - 10.5 K/uL 6.3  Hemoglobin 12.0 - 15.0 g/dL 11.3(L)  Hematocrit 36.0 - 46.0 % 36.9  Platelets 150 - 400 K/uL 240   RADIOGRAPHIC STUDIES: I have personally reviewed the radiological images as listed and agreed with the findings in the report.  Ct Chest Wo Contrast  Result Date: 02/04/2019 CLINICAL DATA:  Shortness of breath EXAM: CT CHEST WITHOUT CONTRAST TECHNIQUE:  Multidetector CT imaging of the chest was performed following the standard protocol without IV contrast. COMPARISON:  Plain film from previous day FINDINGS: Cardiovascular: Somewhat limited due to lack of IV contrast. Aortic calcifications are noted without significant aneurysmal dilatation. No cardiac enlargement is seen. No pericardial effusion is noted. Mediastinum/Nodes: The esophagus is within normal limits. No definitive hilar adenopathy is noted. There is a right  paratracheal node which measures approximately 16 mm in short axis as well as a subcarinal node which measures 17 mm in short axis. These are likely reactive in nature. Thoracic inlet is within normal limits. Lungs/Pleura: The left lung is well aerated with minimal atelectatic changes in the lingula along the cardiac border. No sizable effusion is noted. The right hemithorax demonstrates a moderate to large pleural effusion increased from the prior exam. Underlying patchy infiltrate is noted throughout the right lung with more marked consolidation in the right lung base. No definitive nodule is noted. Upper Abdomen: Somewhat limited due to lack of IV contrast. No definitive abnormality is noted. Musculoskeletal: Mild degenerative change of the thoracic spine is noted. No acute bony abnormality is seen. IMPRESSION: Moderate to large right-sided pleural effusion with associated consolidation most marked in the right lower lobe. Mild left basilar atelectasis. Prominent mediastinal lymph nodes likely reactive in nature. Aortic Atherosclerosis (ICD10-I70.0). Electronically Signed   By: Inez Catalina M.D.   On: 02/04/2019 15:36   Mr Brain Wo Contrast  Result Date: 02/07/2019 CLINICAL DATA:  Initial evaluation for acute confusion, encephalopathy. EXAM: MRI HEAD WITHOUT CONTRAST TECHNIQUE: Multiplanar, multiecho pulse sequences of the brain and surrounding structures were obtained without intravenous contrast. COMPARISON:  None available. FINDINGS: Brain:  Examination mildly degraded by motion artifact. Diffuse prominence of the CSF containing spaces compatible with generalized age-related cerebral atrophy. Mild scattered patchy T2/FLAIR hyperintensity within the periventricular deep white matter both cerebral hemispheres, most consistent with chronic microvascular ischemic disease, mild for age. No abnormal foci of restricted diffusion to suggest acute or subacute ischemia. Gray-white matter differentiation maintained. No encephalomalacia to suggest chronic cortical infarction. No foci of susceptibility artifact to suggest acute or chronic intracranial hemorrhage. No mass lesion, midline shift or mass effect. No hydrocephalus. The extra-axial fluid collection. Pituitary gland within normal limits. Midline structures intact. Vascular: Major intracranial vascular flow voids are maintained Skull and upper cervical spine: Craniocervical junction within normal limits. Multilevel degenerative spondylolysis noted within the visualized upper cervical spine without significant stenosis. Bone marrow signal intensity normal. No scalp soft tissue abnormality. Sinuses/Orbits: Globes and orbital soft tissues within normal limits. Paranasal sinuses are clear. Left mastoid effusion noted. Inner ear structures grossly normal. Other: None. IMPRESSION: 1. No acute intracranial abnormality. 2. Mild age-related cerebral atrophy with chronic microvascular ischemic disease. 3. Left mastoid effusion, of uncertain significance. Correlation with physical exam and symptomatology for possible otomastoiditis recommended. Electronically Signed   By: Jeannine Boga M.D.   On: 02/07/2019 20:33   Dg Chest Port 1 View  Result Date: 02/08/2019 CLINICAL DATA:  Followup pleural effusion. EXAM: PORTABLE CHEST 1 VIEW COMPARISON:  02/07/2019 and earlier exams. FINDINGS: Opacity at the right lung base mostly obscures hemidiaphragm consistent with a small effusion and atelectasis. Opacity at the  left lung base is less prominent than on the previous day's study, the difference likely due to differences in patient positioning only. There is hazy opacity consistent with a smaller left pleural effusion. No new lung abnormalities. No evidence of pulmonary edema. No pneumothorax. Right-sided PICC is stable. IMPRESSION: 1. No significant change from the most recent prior study allowing for differences in patient positioning. 2. Small residual right pleural effusion with associated atelectasis. Smaller left pleural effusion with atelectasis. No convincing pulmonary edema. No pneumothorax. Electronically Signed   By: Lajean Manes M.D.   On: 02/08/2019 13:25   Dg Chest Port 1 View  Result Date: 02/07/2019 CLINICAL DATA:  Status post right-sided thoracentesis. EXAM:  PORTABLE CHEST 1 VIEW COMPARISON:  02/05/2019 FINDINGS: The heart is enlarged but stable. Stable tortuosity and calcification of the thoracic aorta. The right PICC line is stable. Status post right-sided thoracentesis with interval decrease in size of the right pleural effusion. No postprocedural pneumothorax is identified. Small left pleural effusion and bibasilar atelectasis. IMPRESSION: Status post right-sided thoracentesis with near complete evacuation of the right-sided pleural effusion. No postprocedural pneumothorax. Persistent small left effusion and moderate bibasilar atelectasis. Electronically Signed   By: Marijo Sanes M.D.   On: 02/07/2019 10:33   Dg Chest Port 1 View  Result Date: 02/07/2019 CLINICAL DATA:  Shortness of breath. Patient experiencing increased shortness of breath. EXAM: PORTABLE CHEST 1 VIEW COMPARISON:  Portable chest radiograph 02/05/2019 FINDINGS: Unchanged position of a right-sided PICC with tip projecting over the upper right atrium. May consider withdrawing catheter 2.5-3 cm to place tip cavoatrial junction. The cardiomediastinal silhouette is unchanged. Interval increase in size of a right pleural effusion with  increasing right basilar atelectasis. Underlying right basilar pneumonia cannot be excluded. Ill-defined opacity throughout the mid to lower right lung has also increased and may reflect incomplete atelectasis and/or developing pneumonia. Mild left basilar atelectasis is unchanged. No evidence of pneumothorax. Degenerative changes of the spine. IMPRESSION: Unchanged position of a right PICC with tip projecting over the right atrium. Consider withdrawing catheter 2.5-3 cm to place tip at cavoatrial junction. Interval increase in size of a right pleural effusion with increasing right basilar atelectasis. Underlying right basilar pneumonia cannot be excluded. Interval increase in ill-defined opacity throughout the mid to lower right lung may reflect incomplete atelectasis and/or developing pneumonia. Electronically Signed   By: Kellie Simmering   On: 02/07/2019 08:14   Dg Chest Port 1 View  Result Date: 02/05/2019 CLINICAL DATA:  PICC line placement EXAM: PORTABLE CHEST 1 VIEW COMPARISON:  Portable exam 2225 hours compared to 1631 hours FINDINGS: RIGHT arm PICC line with tip projecting over superior RIGHT atrium. May consider withdrawing catheter 2.5-3.0 cm to place tip at cavoatrial junction. Stable heart size and mediastinal contours. RIGHT pleural effusion and basilar atelectasis with questionable RIGHT perihilar infiltrate. Minimal LEFT base atelectasis. IMPRESSION: Tip of RIGHT arm PICC line projects over RIGHT atrium; recommend withdrawal 2.5 x 3.0 cm to place tip at approximately the cavoatrial junction. Otherwise no interval change. Electronically Signed   By: Lavonia Dana M.D.   On: 02/05/2019 22:34   Dg Chest Port 1 View  Result Date: 02/05/2019 CLINICAL DATA:  PICC line placement EXAM: PORTABLE CHEST 1 VIEW COMPARISON:  Portable exam 1613 hours compared to 02/03/2019 FINDINGS: RIGHT arm PICC line tip deflects into the azygos arch. Upper normal heart size. Mediastinal contours and pulmonary vascularity  normal. Atherosclerotic calcification aorta. RIGHT lung infiltrate with small RIGHT pleural effusion and basilar atelectasis. LEFT lung clear. No pneumothorax or acute osseous findings. IMPRESSION: Tip of RIGHT arm PICC line deflects into the azygos arch, recommend repositioning into SVC. Increased RIGHT lung infiltrates, RIGHT pleural effusion and basilar atelectasis. Findings called to [facility] in ICU on 02/05/2019 at 1839 hrs. Electronically Signed   By: Lavonia Dana M.D.   On: 02/05/2019 18:40   Dg Chest Portable 1 View  Result Date: 02/03/2019 CLINICAL DATA:  Shortness of breath. EXAM: PORTABLE CHEST 1 VIEW COMPARISON:  None. FINDINGS: The heart size and pulmonary vascularity are normal. Aortic atherosclerosis. Small right pleural effusion. Focal linear atelectasis in the right midzone. No acute bone abnormality. IMPRESSION: 1. Small right pleural effusion. Slight atelectasis  in the right midzone. 2. Aortic atherosclerosis. Electronically Signed   By: Lorriane Shire M.D.   On: 02/03/2019 18:36   [place] Ekg Site Rite  Result Date: 02/05/2019 If Site Rite image not attached, placement could not be confirmed due to current cardiac rhythm.  US Thoracentesis Asp Pleural Space W/img Guide  Result Date: 02/07/2019 INDICATION: Right pleural effusion, hypertension, diabetes EXAM: ULTRASOUND GUIDED RIGHT THORACENTESIS MEDICATIONS: 1% lidocaine local COMPLICATIONS: None immediate. PROCEDURE: An ultrasound guided thoracentesis was thoroughly discussed with the patient and questions answered. The benefits, risks, alternatives and complications were also discussed. The patient understands and wishes to proceed with the procedure. Written consent was obtained. Ultrasound was performed to localize and mark an adequate pocket of fluid in the right chest. The area was then prepped and draped in the normal sterile fashion. 1% Lidocaine was used for local anesthesia. Under ultrasound guidance a 6 Fr Safe-T-Centesis  catheter was introduced. Thoracentesis was performed. The catheter was removed and a dressing applied. FINDINGS: A total of approximately 1.2 L of amber colored pleural fluid was removed. Samples were sent to the laboratory as requested by the clinical team. IMPRESSION: Successful ultrasound guided right thoracentesis yielding 1.2 L of pleural fluid. Electronically Signed   By: Jerilynn Mages.  Shick M.D.   On: 02/07/2019 10:10    Assessment and plan-  Patient is a 78 y.o. female with history of diabetes and hypertension, former smoker currently admitted to ICU due to hyponatremia.  CT chest reviewed moderate to large right pleural effusion associated with consolidation of right lower lobe.  Prominent mediastinal lymph nodes.  #Right pleural effusion, status post ultrasound guided thoracentesis, with removal of 1.2 L of amber-colored pleural effusion.  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 Official report has not been signed out yet. Flowcytometry is pending.  Official cytology has not been signed out yet. Mediastinal lymphadenopathy, reactive vs metastatic.  CEA, CA-19-9, CA-125 are pending. I called daughter Marciano Sequin who had questions about the preliminary results of pleural effusion. I emphasized that though preliminary results showed possible adenocarcinoma, official results has not been signed out yet.  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  Will update family once results are finalized. Discussed possible future steps including complete staging with CT/PET scans, possible additional biopsy if current biopsy results not conclusive.  Thank you for allowing me to participate in the care of this patient.   Earlie Server, MD, PhD Hematology Oncology [facility] Pager- [phone] 02/09/2019

## 2019-02-09 NOTE — Progress Notes (Signed)
Contacted patient's daughter [name] by phone to follow up with her in regards to patient visitation question. Patient's daughter [name] requested if her Father could come tomorrow to sit with patient. I explained to the daughter only one person is designated due to the current circumstances (AMS) to sit at the bedside with the patient. Also, I reiterated to the daughter that the current policy also advises that the designated family member cannot come and go and must be willing to stay continuously. Per [name], she understands the policy and does not want to put her Mother at risk.   Fuller Mandril, RN

## 2019-02-09 NOTE — Progress Notes (Signed)
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  She also had a moderate right pleural effusion.   -patient being optimized for downgrade to medical floor  Oncology following - appreciate collaboration   PAST MEDICAL HISTORY   Past Medical History:  Diagnosis Date  . Diabetes mellitus without complication ([place])   . Hypertension      SURGICAL HISTORY   Past Surgical History:  Procedure Laterality Date  . ABDOMINAL HYSTERECTOMY    . CHOLECYSTECTOMY       FAMILY HISTORY   History reviewed. No pertinent family history.   SOCIAL HISTORY   Social History   Tobacco Use  . Smoking status: Former Research scientist (life sciences)  . Smokeless tobacco: Never Used  Substance Use Topics  . Alcohol use: Not Currently  . Drug use: Not Currently     MEDICATIONS   Current Medication:  Current Facility-Administered Medications:  .  atenolol (TENORMIN) tablet 50 mg, 50 mg, Oral, Daily, Ojie, Jude, MD, 50 mg at 02/08/19 0919 .  enalapril (VASOTEC) tablet 20 mg, 20 mg, Oral, BID, Ojie, Jude, MD, 20 mg at 02/08/19 2139 .  enoxaparin (LOVENOX) injection 40 mg, 40 mg, Subcutaneous, BID, Charlett Nose, RPH, 40 mg at 02/08/19 2138 .  hydrALAZINE (APRESOLINE) injection 10 mg, 10 mg, Intravenous, Q4H PRN, Lance Coon, MD, 10 mg at 02/05/19 2318 .  insulin aspart (novoLOG) injection 0-5 Units, 0-5 Units, Subcutaneous, QHS, Lance Coon, MD .  insulin aspart (novoLOG) injection 0-9 Units, 0-9 Units, Subcutaneous, TID WC, Lance Coon, MD, 1 Units at 02/04/19 1713 .  ipratropium-albuterol (DUONEB) 0.5-2.5 (3) MG/3ML  nebulizer solution 3 mL, 3 mL, Nebulization, Q4H PRN, Ottie Glazier, MD, 3 mL at 02/08/19 2334 .  ondansetron (ZOFRAN) tablet 4 mg, 4 mg, Oral, Q6H PRN **OR** ondansetron (ZOFRAN) injection 4 mg, 4 mg, Intravenous, Q6H PRN, Lance Coon, MD .  pantoprazole (PROTONIX) EC tablet 40 mg, 40 mg, Oral, Daily, Lance Coon, MD, 40 mg at 02/08/19 0919 .  potassium PHOSPHATE 30 mmol in dextrose 5 % 500 mL infusion, 30 mmol, Intravenous, Once, Awilda Bill, NP, Last Rate: 85 mL/hr at 02/09/19 0527, 30 mmol at 02/09/19 0527 .  sodium chloride flush (NS) 0.9 % injection 10-40 mL, 10-40 mL, Intracatheter, Q12H, Lateef, Munsoor, MD, 10 mL at 02/08/19 2141 .  sodium chloride flush (NS) 0.9 % injection 10-40 mL, 10-40 mL, Intracatheter, PRN, Lateef, Munsoor, MD .  traZODone (DESYREL) tablet 50 mg, 50 mg, Oral, QHS PRN, Lance Coon, MD, 50 mg at 02/05/19 2206    ALLERGIES   Codeine, Atorvastatin, Citalopram, Keflex [cephalexin], and Tylenol [acetaminophen]    REVIEW OF SYSTEMS    Unable to obtain due to confusion  PHYSICAL EXAMINATION   Vitals:   02/09/19 0500 02/09/19 0600  BP:  (!) 141/59  Pulse: 75 73  Resp: 17 16  Temp:    SpO2: 98% 98%    GENERAL:nad HEAD: Normocephalic, atraumatic.  EYES: Pupils equal, round, reactive to light.  No scleral icterus.  MOUTH: Moist mucosal membrane. NECK: Supple. No thyromegaly. No nodules. No JVD.  PULMONARY: decreased bs at right lung CARDIOVASCULAR: S1 and S2. Regular rate and rhythm. No murmurs, rubs, or gallops.  GASTROINTESTINAL: Soft, nontender, non-distended. No masses. Positive bowel sounds. No hepatosplenomegaly.  MUSCULOSKELETAL: No swelling, clubbing,  or edema.  NEUROLOGIC: Mild distress due to acute illness SKIN:intact,warm,dry   LABS AND IMAGING       LAB RESULTS:     Recent Labs  Lab 02/07/19 0454  02/08/19 0539  02/08/19 2133 02/09/19 0115 02/09/19 0402  NA 130*   < > 133*   < > 134* 133* 134*  K 4.2  --  4.2   --   --   --  3.7  CL 94*  --  95*  --   --   --  94*  CO2 30  --  30  --   --   --  32  BUN 13  --  14  --   --   --  13  CREATININE 0.50  --  0.50  --   --   --  0.40*  GLUCOSE 129*  --  109*  --   --   --  103*   < > = values in this interval not displayed.   Recent Labs  Lab 02/06/19 0539 02/07/19 0454 02/08/19 0539  HGB 12.0 12.0 11.3*  HCT 37.5 39.2 36.9  WBC 6.9 6.3 6.3  PLT 264 270 240     IMAGING RESULTS: Dg Chest Port 1 View  Result Date: 02/08/2019 CLINICAL DATA:  Followup pleural effusion. EXAM: PORTABLE CHEST 1 VIEW COMPARISON:  02/07/2019 and earlier exams. FINDINGS: Opacity at the right lung base mostly obscures hemidiaphragm consistent with a small effusion and atelectasis. Opacity at the left lung base is less prominent than on the previous day's study, the difference likely due to differences in patient positioning only. There is hazy opacity consistent with a smaller left pleural effusion. No new lung abnormalities. No evidence of pulmonary edema. No pneumothorax. Right-sided PICC is stable. IMPRESSION: 1. No significant change from the most recent prior study allowing for differences in patient positioning. 2. Small residual right pleural effusion with associated atelectasis. Smaller left pleural effusion with atelectasis. No convincing pulmonary edema. No pneumothorax. Electronically Signed   By: Lajean Manes M.D.   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 as per pleural fluid stuides -stopped 3NS , can keep PICC for now - carefully monitoring Na - diagnostic workup in process -for MRI brain - essentially normal  -family meeting - discussed care plan with daughter yesterday     Right pleural effusion - moderate  - s/p thoracentesis   -O w weaning     Neurology - encephalopathy due to  severe hyponatremia - improved now able to verbally communicate and started eating PO today     GI/Nutrition GI PROPHYLAXIS as indicated DIET-->TF's as tolerated Constipation protocol as indicated  ENDO - ICU hypoglycemic\Hyperglycemia protocol -check FSBS per protocol   ELECTROLYTES -follow labs as needed -replace as needed -pharmacy consultation   DVT/GI PRX ordered -SCDs  TRANSFUSIONS AS NEEDED MONITOR FSBS ASSESS the need for LABS as needed   Critical care provider statement:    Critical care time (minutes):  32   Critical care time was exclusive of:  Separately billable procedures and treating other patients   Critical care was necessary to treat or prevent imminent or life-threatening deterioration of the following conditions:  Altered mental status, severe hyponatremia, DM, Right pleural effusion, multiple comorbid conditions.    Critical care was time spent personally by me on the following activities:  Development of treatment plan with patient or surrogate, discussions with consultants, evaluation of patient's response to treatment, examination of patient, obtaining history from patient or surrogate, ordering and performing treatments and interventions, ordering and review of laboratory studies and re-evaluation of patient's condition.  I assumed direction of critical care for this patient from another provider in my specialty: no    This document was prepared using Dragon voice recognition software and may include unintentional dictation errors.    Ottie Glazier, M.D.  Division of [facility]

## 2019-02-10 ENCOUNTER — Other Ambulatory Visit: Payer: Self-pay | Admitting: Oncology

## 2019-02-10 ENCOUNTER — Inpatient Hospital Stay: Payer: Medicare Other

## 2019-02-10 DIAGNOSIS — C3490 Malignant neoplasm of unspecified part of unspecified bronchus or lung: Secondary | ICD-10-CM

## 2019-02-10 LAB — SODIUM
Sodium: 130 mmol/L — ABNORMAL LOW (ref 135–145)
Sodium: 134 mmol/L — ABNORMAL LOW (ref 135–145)

## 2019-02-10 LAB — BODY FLUID CULTURE: Culture: NO GROWTH

## 2019-02-10 LAB — PHOSPHORUS
Phosphorus: 2.3 mg/dL — ABNORMAL LOW (ref 2.5–4.6)
Phosphorus: 2.5 mg/dL (ref 2.5–4.6)

## 2019-02-10 LAB — BASIC METABOLIC PANEL
Anion gap: 6 (ref 5–15)
BUN: 10 mg/dL (ref 8–23)
CO2: 35 mmol/L — ABNORMAL HIGH (ref 22–32)
Calcium: 8.7 mg/dL — ABNORMAL LOW (ref 8.9–10.3)
Chloride: 91 mmol/L — ABNORMAL LOW (ref 98–111)
Creatinine, Ser: 0.36 mg/dL — ABNORMAL LOW (ref 0.44–1.00)
GFR calc Af Amer: >60 mL/min (ref >60)
GFR calc non Af Amer: >60 mL/min (ref >60)
Glucose, Bld: 132 mg/dL — ABNORMAL HIGH (ref 70–99)
Potassium: 3.8 mmol/L (ref 3.5–5.1)
Sodium: 132 mmol/L — ABNORMAL LOW (ref 135–145)

## 2019-02-10 LAB — CA 125: Cancer Antigen (CA) 125: 122 U/mL — ABNORMAL HIGH (ref 0.0–38.1)

## 2019-02-10 LAB — GLUCOSE, CAPILLARY
Glucose-Capillary: 126 mg/dL — ABNORMAL HIGH (ref 70–99)
Glucose-Capillary: 164 mg/dL — ABNORMAL HIGH (ref 70–99)
Glucose-Capillary: 164 mg/dL — ABNORMAL HIGH (ref 70–99)
Glucose-Capillary: 91 mg/dL (ref 70–99)

## 2019-02-10 LAB — POTASSIUM: Potassium: 3.9 mmol/L (ref 3.5–5.1)

## 2019-02-10 LAB — CEA: CEA: 11.5 ng/mL — ABNORMAL HIGH (ref 0.0–4.7)

## 2019-02-10 LAB — CANCER ANTIGEN 19-9: CA 19-9: 1 U/mL (ref 0–35)

## 2019-02-10 LAB — MAGNESIUM: Magnesium: 1.8 mg/dL (ref 1.7–2.4)

## 2019-02-10 LAB — CYTOLOGY - NON PAP

## 2019-02-10 MED ORDER — POLYETHYLENE GLYCOL 3350 17 G PO PACK
17.0000 g | PACK | Freq: Every day | ORAL | Status: DC
  Administered 2019-02-10 – 2019-02-14 (×5): 17 g via ORAL
  Filled 2019-02-10 (×5): qty 1

## 2019-02-10 MED ORDER — FUROSEMIDE 10 MG/ML IJ SOLN
20.0000 mg | Freq: Once | INTRAMUSCULAR | Status: AC
  Administered 2019-02-10: 20 mg via INTRAVENOUS
  Filled 2019-02-10: qty 4

## 2019-02-10 MED ORDER — SODIUM PHOSPHATES 45 MMOLE/15ML IV SOLN
15.0000 mmol | Freq: Once | INTRAVENOUS | Status: AC
  Administered 2019-02-10: 15 mmol via INTRAVENOUS
  Filled 2019-02-10: qty 5

## 2019-02-10 MED ORDER — DOCUSATE SODIUM 100 MG PO CAPS
100.0000 mg | ORAL_CAPSULE | Freq: Two times a day (BID) | ORAL | Status: DC
  Administered 2019-02-10 – 2019-02-14 (×8): 100 mg via ORAL
  Filled 2019-02-10 (×9): qty 1

## 2019-02-10 NOTE — Progress Notes (Signed)
Bed unavailable. Pt unable to do flutter valve at this time.

## 2019-02-10 NOTE — Progress Notes (Signed)
Daughter called for update. Update given.   Fuller Mandril, RN

## 2019-02-10 NOTE — Care Management Important Message (Signed)
Important Message  Patient Details  Name: Cathy Buck MRN: [id number] Date of Birth: 1941/06/04   Medicare Important Message Given:  Yes     Dannette Barbara 02/10/2019, 12:56 PM

## 2019-02-10 NOTE — Progress Notes (Signed)
Pharmacy Electrolyte Monitoring Consult:  Pharmacy consulted to assist in monitoring and replacing electrolytes in this 78 y.o. 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   Labs:  Sodium (mmol/L)  Date Value  02/10/2019 132 (L)   Potassium (mmol/L)  Date Value  02/10/2019 3.8   Magnesium (mg/dL)  Date Value  02/10/2019 1.8   Phosphorus (mg/dL)  Date Value  02/10/2019 2.3 (L)   Calcium (mg/dL)  Date Value  02/10/2019 8.7 (L)   Albumin (g/dL)  Date Value  02/08/2019 3.1 (L)    Assessment/Plan: Hypertonic saline stopped on 7/27. 7/30 K 3.8,  Mag 1.8,  Phos 2.3,  Scr 0.36  Na 132  Will order sodium phosphate 15 mmol IV x 1.  Will recheck Phos at 1800  BMP/Magnesium/Phosphorus with am labs.   Will replace to maintain electrolytes within normal limits.   Pharmacy will continue to monitor and adjust per consult.   Cathy Buck A 02/10/2019 7:37 AM

## 2019-02-10 NOTE — Progress Notes (Signed)
Pharmacy Electrolyte Monitoring Consult:  Pharmacy consulted to assist in monitoring and replacing electrolytes in this 78 y.o. 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   Labs:  Sodium (mmol/L)  Date Value  02/10/2019 134 (L)   Potassium (mmol/L)  Date Value  02/10/2019 3.9   Magnesium (mg/dL)  Date Value  02/10/2019 1.8   Phosphorus (mg/dL)  Date Value  02/10/2019 2.5   Calcium (mg/dL)  Date Value  02/10/2019 8.7 (L)   Albumin (g/dL)  Date Value  02/08/2019 3.1 (L)    Assessment/Plan:  Hypertonic saline stopped on 7/27  Following replacement this afternoon electrolytes are wnl (Na borderline low but improved)  No further electrolyte supplementation warranted at this time  F/U electrolyte levels in am  Pharmacy will continue to monitor and adjust per consult.   Dallie Piles, PharmD 02/10/2019 8:21 PM

## 2019-02-10 NOTE — Evaluation (Signed)
Physical Therapy Evaluation Patient Details Name: Cathy Buck MRN: [id number] DOB: May 07, 1941 Today's Date: 02/10/2019   History of Present Illness  Pt is a 78 year old female with past medical history that includes diabetes and hypertension who presented to the hospital due to shortness of breath and weakness and was noted to be severely hyponatremic.  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    Clinical Impression  Pt presents with deficits in strength, transfers, mobility, gait, balance, and activity tolerance.  Pt was max A with sup to/from sit and mod A with rolling left/right.  Multiple attempts made to stand from the EOB at various heights with the pt able to [facility] her buttocks from the EOB somewhat but unable to clear the mattress completely.  Pt presents with a significant decline in functional strength and mobility compared to her recent baseline and would not be safe to return to her prior living situation at this time.  Pt will benefit from PT services in a SNF setting upon discharge to safely address above deficits for decreased caregiver assistance and eventual return to PLOF.      Follow Up Recommendations SNF    Equipment Recommendations  (TBD at next venue of care)    Recommendations for Other Services       Precautions / Restrictions Precautions Precautions: Fall Restrictions Weight Bearing Restrictions: No      Mobility  Bed Mobility Overal bed mobility: Needs Assistance Bed Mobility: Supine to Sit;Sit to Supine;Rolling Rolling: Mod assist   Supine to sit: Max assist Sit to supine: Max assist   General bed mobility comments: Mod verbal and tactile cues for sequencing  Transfers                 General transfer comment: Pt able to slightly  unweight buttocks from the EOB but unable to clear the mattress  Ambulation/Gait             General Gait Details: Unable  Stairs            Wheelchair Mobility    Modified Rankin (Stroke Patients Only)       Balance Overall balance assessment: Needs assistance Sitting-balance support: Feet supported;Single extremity supported Sitting balance-Leahy Scale: Fair         Standing balance comment: Unable to stand                             Pertinent Vitals/Pain Pain Assessment: No/denies pain    Home Living Family/patient expects to be discharged to:: Private residence(History from pt and assisted by daughter via phone call) Living Arrangements: Spouse/significant other Available Help at Discharge: Family;Available 24 hours/day Type of Home: House Home Access: Stairs to enter   [employer] of Steps: 1 step after inside the home to get down into living area with a grab bar; level entry into the home from outside Home Layout: One level Home Equipment: Shower seat;Grab bars - toilet;Walker - 4 wheels      Prior Function Level of Independence: Independent with assistive device(s)         Comments: Mod Ind amb with a rollator limited community distances, no fall history, Ind with ADLs     Hand Dominance        Extremity/Trunk Assessment   Upper Extremity Assessment Upper  Extremity Assessment: Generalized weakness    Lower Extremity Assessment Lower Extremity Assessment: Generalized weakness       Communication   Communication: No difficulties  Cognition Arousal/Alertness: Awake/alert Behavior During Therapy: WFL for tasks assessed/performed Overall Cognitive Status: Within Functional Limits for tasks assessed                                        General Comments      Exercises Total Joint Exercises Ankle Circles/Pumps: AROM;Both;5 reps;10 reps Quad Sets: Strengthening;Both;5 reps;10 reps Heel  Slides: AAROM;Both;10 reps Hip ABduction/ADduction: AAROM;Both;10 reps Straight Leg Raises: AAROM;Both;10 reps Long Arc Quad: AROM;Both;5 reps;10 reps Knee Flexion: AROM;Both;5 reps;10 reps Marching in Standing: AAROM;Both;5 reps;Seated   Assessment/Plan    PT Assessment Patient needs continued PT services  PT Problem List Decreased strength;Decreased activity tolerance;Decreased balance;Decreased mobility       PT Treatment Interventions DME instruction;Gait training;Stair training;Functional mobility training;Therapeutic activities;Therapeutic exercise;Balance training;Patient/family education    PT Goals (Current goals can be found in the Care Plan section)  Acute Rehab PT Goals Patient Stated Goal: To get stronger PT Goal Formulation: With patient Time For Goal Achievement: 02/23/19 Potential to Achieve Goals: Good    Frequency Min 2X/week   Barriers to discharge Inaccessible home environment;Decreased caregiver support      Co-evaluation               AM-PAC PT "6 Clicks" Mobility  Outcome Measure Help needed turning from your back to your side while in a flat bed without using bedrails?: A Lot Help needed moving from lying on your back to sitting on the side of a flat bed without using bedrails?: A Lot Help needed moving to and from a bed to a chair (including a wheelchair)?: Total Help needed standing up from a chair using your arms (e.g., wheelchair or bedside chair)?: Total Help needed to walk in hospital room?: Total Help needed climbing 3-5 steps with a railing? : Total 6 Click Score: 8    End of Session Equipment Utilized During Treatment: Gait belt;Oxygen Activity Tolerance: Patient tolerated treatment well Patient left: in bed;with bed alarm set;with nursing/sitter in room;with call bell/phone within reach(CNA's in room to change linens at end of session) Nurse Communication: Mobility status PT Visit Diagnosis: Muscle weakness (generalized)  (M62.81);Difficulty in walking, not elsewhere classified (R26.2)    Time: 0354-6568 PT Time Calculation (min) (ACUTE ONLY): 33 min   Charges:   PT Evaluation $PT Eval Low Complexity: 1 Low PT Treatments $Therapeutic Exercise: 8-22 mins        D. Royetta Asal PT, DPT 02/10/19, 4:33 PM

## 2019-02-10 NOTE — Progress Notes (Signed)
[place] [facility] [place] NAME: [place]    MR#:  [id number]  DATE OF BIRTH:  September 17, 1940  SUBJECTIVE:   Sodium level is stable. Mental status has improved.  Patient became short of breath and had some wheezing bronchospasm overnight and had a two-view chest x-ray showing right-sided pleural effusion and possible left-sided consolidation.  No other acute events overnight.  REVIEW OF SYSTEMS:    Review of Systems  Constitutional: Negative for chills and fever.  HENT: Negative for congestion and tinnitus.   Eyes: Negative for blurred vision and double vision.  Respiratory: Positive for shortness of breath. Negative for cough and wheezing.   Cardiovascular: Negative for chest pain, orthopnea and PND.  Gastrointestinal: Negative for abdominal pain, diarrhea, nausea and vomiting.  Genitourinary: Negative for dysuria and hematuria.  Neurological: Positive for weakness (generalized). Negative for dizziness, sensory change and focal weakness.  All other systems reviewed and are negative.   Nutrition: Heart healthy/carb modified Tolerating Diet: yes little.  Tolerating PT: Await Eval.    DRUG ALLERGIES:   Allergies  Allergen Reactions  . Codeine Hives  . Atorvastatin Other (See Comments)  . Citalopram Other (See Comments)    Nausea  . Keflex [Cephalexin] Diarrhea  . Tylenol [Acetaminophen] Hives    VITALS:  Blood pressure 139/69, pulse 76, temperature 98.4 F (36.9 C), temperature source Oral, resp. rate 16, height 5\' 4"  (1.626 m), weight 108 kg, SpO2 100 %.  PHYSICAL EXAMINATION:   Physical Exam  GENERAL:  78 y.o.-year-old patient lying in bed lethargic but follows simple commands.  EYES: Pupils equal, round, reactive to light and accommodation. No scleral icterus. Extraocular muscles intact.  HEENT: Head atraumatic, normocephalic. Oropharynx and nasopharynx clear.  NECK:  Supple, no jugular venous distention. No thyroid  enlargement, no tenderness.  LUNGS: Good a/e b/l,  no wheezing, rales, rhonchi. No use of accessory muscles of respiration.  CARDIOVASCULAR: S1, S2 normal. No murmurs, rubs, or gallops.  ABDOMEN: Soft, nontender, nondistended. Bowel sounds present. No organomegaly or mass.  EXTREMITIES: No cyanosis, clubbing or edema b/l.    NEUROLOGIC: Cranial nerves II through XII are intact. No focal Motor or sensory deficits b/l. Globally weak.   PSYCHIATRIC: The patient is alert and oriented x 3.  SKIN: No obvious rash, lesion, or ulcer.    LABORATORY PANEL:   CBC Recent Labs  Lab 02/08/19 0539  WBC 6.3  HGB 11.3*  HCT 36.9  PLT 240   ------------------------------------------------------------------------------------------------------------------  Chemistries  Recent Labs  Lab 02/08/19 0539  02/10/19 0545  NA 133*   < > 132*  K 4.2   < > 3.8  CL 95*   < > 91*  CO2 30   < > 35*  GLUCOSE 109*   < > 132*  BUN 14   < > 10  CREATININE 0.50   < > 0.36*  CALCIUM 9.3   < > 8.7*  MG 2.0   < > 1.8  AST 17  --   --   ALT 19  --   --   ALKPHOS 51  --   --   BILITOT 0.8  --   --    < > = values in this interval not displayed.   ------------------------------------------------------------------------------------------------------------------  Cardiac Enzymes No results for input(s): TROPONINI in the last 168 hours. ------------------------------------------------------------------------------------------------------------------  RADIOLOGY:  Dg Chest 2 View  Result Date: 02/10/2019 CLINICAL DATA:  Pleural effusion EXAM: CHEST - 2 VIEW COMPARISON:  Two days ago FINDINGS: Right PICC with tip at the right atrium. Haziness in the lower right chest. Hyperinflation with diaphragm flattening. Thickened right hilum. There was adenopathy on preceding chest CT. Normal heart size. IMPRESSION: Unchanged right pleural effusion and lower lobe opacification. Electronically Signed   By: Monte Fantasia  M.D.   On: 02/10/2019 10:39     ASSESSMENT AND PLAN:   78 year old female with past medical history of diabetes, hypertension who presents to the hospital due to shortness of breath and weakness and noted to be severely hyponatremic.  1.  Acute respiratory failure with hypoxia-secondary to a right-sided pleural effusion. -Patient underwent CT of the chest which showed a moderate to large right-sided pleural effusion with associated consolidation. -Status post ultrasound-guided thoracentesis with 1.3 L of fluid removed. -Cytology on the pleural fluid pulmonary showing possible adenocarcinoma.  Oncology has been consulted. -Overnight patient's shortness of breath got a bit worse, 2 view chest x-ray showing right pleural effusion and underlying consolidation.  Patient is afebrile, hold off on antibiotics presently.  Given 1 dose of IV Lasix.  We will continue to monitor.  2.  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 -Seen by nephrology and status post 3% saline and sodium has now normalized.  -Nephrology has signed off, sodium level stable.  3.  Pleural effusion- status post thoracentesis.  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  Seen by oncology and await official pathology report before initiating staging imaging and considering treatment. -Patient's family does want staging and possible treatment for her cancer she was quite active prior to coming to the hospital.  4. Essential HTN - cont. Atenolol, Vasotec.  - BP stable.   5. DM Type II w/out complication - cont. SSI and follow BS  6. GERD - cont. Protonix.   7.  Altered mental status/lethargy-etiology unclear but suspect to be multifactorial related to underlying hyponatremia, possible underlying dementia/cognitive decline. - MRI was negative for acute pathology and mental status is improving.  Await PT eval.   All the records are reviewed and case discussed with Care Management/Social Worker. Management plans  discussed with the patient, family and they are in agreement.  CODE STATUS: Full code  DVT Prophylaxis: Lovenox  TOTAL TIME TAKING CARE OF THIS PATIENT: 30 minutes.   POSSIBLE D/C unclear  DAYS, DEPENDING ON CLINICAL CONDITION and progress.   Henreitta Leber M.D on 02/10/2019 at 2:21 PM  Between 7am to 6pm - Pager - [phone]  After 6pm go to [email or web address] - Proofreader  [facility]  Hospitalists  Office  [phone]  CC: Primary care physician; Sallee Lange, NP

## 2019-02-10 NOTE — TOC Initial Note (Signed)
Transition of Care [facility]) - Initial/Assessment Note    Patient Details  Name: Cathy Buck MRN: [id number] Date of Birth: Jun 14, 1941  Transition of [facility]) CM/SW Contact:    Beverly Sessions, RN Phone Number: 02/10/2019, 3:37 PM  Clinical Narrative:                 Patient admitted from home with hyponatremia Patient transferred to 2c from ICU  Patient states that she lives at home with her husband  PCP [facility] in [place]  Denies any issues obtaining medications  States that her daughter lives locally and provides transportation  Patient states that she is independent. Has never had home health or been to SNF.   Patient states that she has a rollator and cane at home  PT eval is pedning   Expected Discharge Plan: [place]     Patient Goals and CMS Choice        Expected Discharge Plan and Services Expected Discharge Plan: [place]       Living arrangements for the past 2 months: Single Family Home                                      Prior Living Arrangements/Services Living arrangements for the past 2 months: Single Family Home Lives with:: Spouse   Do you feel safe going back to the place where you live?: Yes      Need for Family Participation in Patient Care: Yes (Comment) Care giver support system in place?: Yes (comment) Current home services: DME Criminal Activity/Legal Involvement Pertinent to Current Situation/Hospitalization: No - Comment as needed  Activities of Daily Living Home Assistive Devices/Equipment: Cane (specify quad or straight), CBG Meter, Walker (specify type) ADL Screening (condition at time of admission) Patient's cognitive ability adequate to safely complete daily activities?: Yes Is the patient deaf or have difficulty hearing?: Yes Does the patient have difficulty seeing, even when wearing glasses/contacts?: No Does the patient have difficulty concentrating,  remembering, or making decisions?: No Patient able to express need for assistance with ADLs?: Yes Does the patient have difficulty dressing or bathing?: Yes Independently performs ADLs?: No Communication: Independent Dressing (OT): Needs assistance Is this a change from baseline?: Pre-admission baseline Grooming: Needs assistance Is this a change from baseline?: Pre-admission baseline Feeding: Independent Toileting: Needs assistance Is this a change from baseline?: Pre-admission baseline In/Out Bed: Needs assistance Is this a change from baseline?: Pre-admission baseline Walks in Home: Needs assistance Is this a change from baseline?: Pre-admission baseline Does the patient have difficulty walking or climbing stairs?: Yes Weakness of Legs: Both Weakness of Arms/Hands: Both  Permission Sought/Granted                  Emotional Assessment Appearance:: Appears stated age Attitude/Demeanor/Rapport: Gracious Affect (typically observed): Accepting Orientation: : Oriented to Self, Oriented to Place, Oriented to  Time, Oriented to Situation   Psych Involvement: No (comment)  Admission diagnosis:  Hyponatremia [E87.1] Weakness [R53.1] Patient Active Problem List   Diagnosis Date Noted  . Pleural effusion   . Weakness   . Hyponatremia 02/03/2019  . HTN (hypertension) 02/03/2019  . Diabetes ([place]) 02/03/2019   PCP:  Sallee Lange, NP Pharmacy:   [facility] [address] [place] [address] Phone: [phone] Fax: [phone]  Social Determinants of Health (SDOH) Interventions    Readmission Risk Interventions No flowsheet data found.

## 2019-02-10 NOTE — Progress Notes (Signed)
Hematology/Oncology Progress Note [facility] [phone] [phone]  Patient Care Team: Sallee Lange, NP as PCP - General (Internal Medicine)   Name of the patient: Cathy Buck  [id number]  [birth year]  Date of visit: 02/10/19   INTERVAL HISTORY-  Patient is lying in bed comfortably. Breathing via 2L oxygen.  She just participated in physical therapy.    Review of systems- Review of Systems  Unable to perform ROS: Mental status change  denies any pain.   Allergies  Allergen Reactions   Codeine Hives   Atorvastatin Other (See Comments)   Citalopram Other (See Comments)    Nausea   Keflex [Cephalexin] Diarrhea   Tylenol [Acetaminophen] Hives    Patient Active Problem List   Diagnosis Date Noted   Pleural effusion    Weakness    Hyponatremia 02/03/2019   HTN (hypertension) 02/03/2019   Diabetes ([place]) 02/03/2019     Past Medical History:  Diagnosis Date   Diabetes mellitus without complication ([place])    Hypertension      Past Surgical History:  Procedure Laterality Date   ABDOMINAL HYSTERECTOMY     CHOLECYSTECTOMY      Social History   Socioeconomic History   Marital status: Married    Spouse name: Not on file   Number of children: Not on file   Years of education: Not on file   Highest education level: Not on file  Occupational History   Not on file  Social Needs   Financial resource strain: Not hard at all   Food insecurity    Worry: Never true    Inability: Never true   Transportation needs    Medical: No    Non-medical: No  Tobacco Use   Smoking status: Former Smoker   Smokeless tobacco: Never Used  Substance and Sexual Activity   Alcohol use: Not Currently   Drug use: Not Currently   Sexual activity: Not Currently  Lifestyle   Physical activity    Days per week: 0 days    Minutes per session: 0 min   Stress: Not at all  Relationships   Social  connections    Talks on phone: Once a week    Gets together: Once a week    Attends religious service: 1 to 4 times per year    Active member of club or organization: No    Attends meetings of clubs or organizations: Never    Relationship status: Married   Intimate partner violence    Fear of current or ex partner: No    Emotionally abused: No    Physically abused: No    Forced sexual activity: No  Other Topics Concern   Not on file  Social History Narrative   Lives at home with husband.  Uses cane, walker and rollator a times     History reviewed. No pertinent family history.   Current Facility-Administered Medications:    atenolol (TENORMIN) tablet 50 mg, 50 mg, Oral, Daily, Ojie, Jude, MD, 50 mg at 02/10/19 1022   docusate sodium (COLACE) capsule 100 mg, 100 mg, Oral, BID, Sainani, Vivek J, MD, 100 mg at 02/10/19 1742   enalapril (VASOTEC) tablet 20 mg, 20 mg, Oral, BID, Ojie, Jude, MD, 20 mg at 02/10/19 1022   enoxaparin (LOVENOX) injection 40 mg, 40 mg, Subcutaneous, BID, Charlett Nose, RPH, 40 mg at 02/10/19 1022   hydrALAZINE (APRESOLINE) injection 10 mg, 10 mg, Intravenous, Q4H PRN, Lance Coon, MD, 10 mg at  02/05/19 2318   insulin aspart (novoLOG) injection 0-5 Units, 0-5 Units, Subcutaneous, QHS, Lance Coon, MD   insulin aspart (novoLOG) injection 0-9 Units, 0-9 Units, Subcutaneous, TID WC, Lance Coon, MD, 2 Units at 02/10/19 1743   ipratropium-albuterol (DUONEB) 0.5-2.5 (3) MG/3ML nebulizer solution 3 mL, 3 mL, Nebulization, Q4H PRN, Ottie Glazier, MD, 3 mL at 02/10/19 0926   ondansetron (ZOFRAN) tablet 4 mg, 4 mg, Oral, Q6H PRN **OR** ondansetron (ZOFRAN) injection 4 mg, 4 mg, Intravenous, Q6H PRN, Lance Coon, MD   pantoprazole (PROTONIX) EC tablet 40 mg, 40 mg, Oral, Daily, Lance Coon, MD, 40 mg at 02/10/19 1022   polyethylene glycol (MIRALAX / GLYCOLAX) packet 17 g, 17 g, Oral, Daily, Sainani, Vivek J, MD, 17 g at 02/10/19 1742    sodium chloride flush (NS) 0.9 % injection 10-40 mL, 10-40 mL, Intracatheter, Q12H, Lateef, Munsoor, MD, 10 mL at 02/10/19 0947   sodium chloride flush (NS) 0.9 % injection 10-40 mL, 10-40 mL, Intracatheter, PRN, Lateef, Munsoor, MD   traZODone (DESYREL) tablet 50 mg, 50 mg, Oral, QHS PRN, Lance Coon, MD, 50 mg at 02/10/19 0057   Physical exam:  Vitals:   02/10/19 0433 02/10/19 0930 02/10/19 0956 02/10/19 1235  BP: (!) 163/81  119/60 139/69  Pulse: (!) 103  84 76  Resp: 18  16 16   Temp: 98.3 F (36.8 C)  (!) 97.4 F (36.3 C) 98.4 F (36.9 C)  TempSrc: Oral  Oral Oral  SpO2: 100% 99% 98% 100%  Weight:      Height:       Physical Exam  Constitutional: No distress.  HENT:  Head: Normocephalic and atraumatic.  Nose: Nose normal.  Mouth/Throat: No oropharyngeal exudate.  Eyes: Pupils are equal, round, and reactive to light. EOM are normal. No scleral icterus.  Neck: Normal range of motion. Neck supple.  Cardiovascular: Normal rate and regular rhythm.  No murmur heard. Pulmonary/Chest: Effort normal. No respiratory distress. She has no rales. She exhibits no tenderness.  Decreased breath sounds.  Abdominal: Soft. Bowel sounds are normal. She exhibits no distension. There is no abdominal tenderness.  Musculoskeletal: Normal range of motion.        General: No edema.  Neurological: She is alert.  Orientated to name, place  Skin: Skin is warm and dry. She is not diaphoretic. No erythema.  Psychiatric: Affect normal.       CMP Latest Ref Rng & Units 02/10/2019  Glucose 70 - 99 mg/dL 132(H)  BUN 8 - 23 mg/dL 10  Creatinine 0.44 - 1.00 mg/dL 0.36(L)  Sodium 135 - 145 mmol/L 132(L)  Potassium 3.5 - 5.1 mmol/L 3.8  Chloride 98 - 111 mmol/L 91(L)  CO2 22 - 32 mmol/L 35(H)  Calcium 8.9 - 10.3 mg/dL 8.7(L)  Total Protein 6.5 - 8.1 g/dL -  Total Bilirubin 0.3 - 1.2 mg/dL -  Alkaline Phos 38 - 126 U/L -  AST 15 - 41 U/L -  ALT 0 - 44 U/L -   CBC Latest Ref Rng & Units  02/08/2019  WBC 4.0 - 10.5 K/uL 6.3  Hemoglobin 12.0 - 15.0 g/dL 11.3(L)  Hematocrit 36.0 - 46.0 % 36.9  Platelets 150 - 400 K/uL 240   RADIOGRAPHIC STUDIES: I have personally reviewed the radiological images as listed and agreed with the findings in the report.  Dg Chest 2 View  Result Date: 02/10/2019 CLINICAL DATA:  Pleural effusion EXAM: CHEST - 2 VIEW COMPARISON:  Two days ago FINDINGS: Right PICC with tip  at the right atrium. Haziness in the lower right chest. Hyperinflation with diaphragm flattening. Thickened right hilum. There was adenopathy on preceding chest CT. Normal heart size. IMPRESSION: Unchanged right pleural effusion and lower lobe opacification. Electronically Signed   By: Monte Fantasia M.D.   On: 02/10/2019 10:39   Ct Chest Wo Contrast  Result Date: 02/04/2019 CLINICAL DATA:  Shortness of breath EXAM: CT CHEST WITHOUT CONTRAST TECHNIQUE: Multidetector CT imaging of the chest was performed following the standard protocol without IV contrast. COMPARISON:  Plain film from previous day FINDINGS: Cardiovascular: Somewhat limited due to lack of IV contrast. Aortic calcifications are noted without significant aneurysmal dilatation. No cardiac enlargement is seen. No pericardial effusion is noted. Mediastinum/Nodes: The esophagus is within normal limits. No definitive hilar adenopathy is noted. There is a right paratracheal node which measures approximately 16 mm in short axis as well as a subcarinal node which measures 17 mm in short axis. These are likely reactive in nature. Thoracic inlet is within normal limits. Lungs/Pleura: The left lung is well aerated with minimal atelectatic changes in the lingula along the cardiac border. No sizable effusion is noted. The right hemithorax demonstrates a moderate to large pleural effusion increased from the prior exam. Underlying patchy infiltrate is noted throughout the right lung with more marked consolidation in the right lung base. No  definitive nodule is noted. Upper Abdomen: Somewhat limited due to lack of IV contrast. No definitive abnormality is noted. Musculoskeletal: Mild degenerative change of the thoracic spine is noted. No acute bony abnormality is seen. IMPRESSION: Moderate to large right-sided pleural effusion with associated consolidation most marked in the right lower lobe. Mild left basilar atelectasis. Prominent mediastinal lymph nodes likely reactive in nature. Aortic Atherosclerosis (ICD10-I70.0). Electronically Signed   By: Inez Catalina M.D.   On: 02/04/2019 15:36   Mr Brain Wo Contrast  Result Date: 02/07/2019 CLINICAL DATA:  Initial evaluation for acute confusion, encephalopathy. EXAM: MRI HEAD WITHOUT CONTRAST TECHNIQUE: Multiplanar, multiecho pulse sequences of the brain and surrounding structures were obtained without intravenous contrast. COMPARISON:  None available. FINDINGS: Brain: Examination mildly degraded by motion artifact. Diffuse prominence of the CSF containing spaces compatible with generalized age-related cerebral atrophy. Mild scattered patchy T2/FLAIR hyperintensity within the periventricular deep white matter both cerebral hemispheres, most consistent with chronic microvascular ischemic disease, mild for age. No abnormal foci of restricted diffusion to suggest acute or subacute ischemia. Gray-white matter differentiation maintained. No encephalomalacia to suggest chronic cortical infarction. No foci of susceptibility artifact to suggest acute or chronic intracranial hemorrhage. No mass lesion, midline shift or mass effect. No hydrocephalus. The extra-axial fluid collection. Pituitary gland within normal limits. Midline structures intact. Vascular: Major intracranial vascular flow voids are maintained Skull and upper cervical spine: Craniocervical junction within normal limits. Multilevel degenerative spondylolysis noted within the visualized upper cervical spine without significant stenosis. Bone marrow  signal intensity normal. No scalp soft tissue abnormality. Sinuses/Orbits: Globes and orbital soft tissues within normal limits. Paranasal sinuses are clear. Left mastoid effusion noted. Inner ear structures grossly normal. Other: None. IMPRESSION: 1. No acute intracranial abnormality. 2. Mild age-related cerebral atrophy with chronic microvascular ischemic disease. 3. Left mastoid effusion, of uncertain significance. Correlation with physical exam and symptomatology for possible otomastoiditis recommended. Electronically Signed   By: Jeannine Boga M.D.   On: 02/07/2019 20:33   Dg Chest Port 1 View  Result Date: 02/08/2019 CLINICAL DATA:  Followup pleural effusion. EXAM: PORTABLE CHEST 1 VIEW COMPARISON:  02/07/2019 and earlier exams.  FINDINGS: Opacity at the right lung base mostly obscures hemidiaphragm consistent with a small effusion and atelectasis. Opacity at the left lung base is less prominent than on the previous day's study, the difference likely due to differences in patient positioning only. There is hazy opacity consistent with a smaller left pleural effusion. No new lung abnormalities. No evidence of pulmonary edema. No pneumothorax. Right-sided PICC is stable. IMPRESSION: 1. No significant change from the most recent prior study allowing for differences in patient positioning. 2. Small residual right pleural effusion with associated atelectasis. Smaller left pleural effusion with atelectasis. No convincing pulmonary edema. No pneumothorax. Electronically Signed   By: Lajean Manes M.D.   On: 02/08/2019 13:25   Dg Chest Port 1 View  Result Date: 02/07/2019 CLINICAL DATA:  Status post right-sided thoracentesis. EXAM: PORTABLE CHEST 1 VIEW COMPARISON:  02/05/2019 FINDINGS: The heart is enlarged but stable. Stable tortuosity and calcification of the thoracic aorta. The right PICC line is stable. Status post right-sided thoracentesis with interval decrease in size of the right pleural  effusion. No postprocedural pneumothorax is identified. Small left pleural effusion and bibasilar atelectasis. IMPRESSION: Status post right-sided thoracentesis with near complete evacuation of the right-sided pleural effusion. No postprocedural pneumothorax. Persistent small left effusion and moderate bibasilar atelectasis. Electronically Signed   By: Marijo Sanes M.D.   On: 02/07/2019 10:33   Dg Chest Port 1 View  Result Date: 02/07/2019 CLINICAL DATA:  Shortness of breath. Patient experiencing increased shortness of breath. EXAM: PORTABLE CHEST 1 VIEW COMPARISON:  Portable chest radiograph 02/05/2019 FINDINGS: Unchanged position of a right-sided PICC with tip projecting over the upper right atrium. May consider withdrawing catheter 2.5-3 cm to place tip cavoatrial junction. The cardiomediastinal silhouette is unchanged. Interval increase in size of a right pleural effusion with increasing right basilar atelectasis. Underlying right basilar pneumonia cannot be excluded. Ill-defined opacity throughout the mid to lower right lung has also increased and may reflect incomplete atelectasis and/or developing pneumonia. Mild left basilar atelectasis is unchanged. No evidence of pneumothorax. Degenerative changes of the spine. IMPRESSION: Unchanged position of a right PICC with tip projecting over the right atrium. Consider withdrawing catheter 2.5-3 cm to place tip at cavoatrial junction. Interval increase in size of a right pleural effusion with increasing right basilar atelectasis. Underlying right basilar pneumonia cannot be excluded. Interval increase in ill-defined opacity throughout the mid to lower right lung may reflect incomplete atelectasis and/or developing pneumonia. Electronically Signed   By: Kellie Simmering   On: 02/07/2019 08:14   Dg Chest Port 1 View  Result Date: 02/05/2019 CLINICAL DATA:  PICC line placement EXAM: PORTABLE CHEST 1 VIEW COMPARISON:  Portable exam 2225 hours compared to 1631 hours  FINDINGS: RIGHT arm PICC line with tip projecting over superior RIGHT atrium. May consider withdrawing catheter 2.5-3.0 cm to place tip at cavoatrial junction. Stable heart size and mediastinal contours. RIGHT pleural effusion and basilar atelectasis with questionable RIGHT perihilar infiltrate. Minimal LEFT base atelectasis. IMPRESSION: Tip of RIGHT arm PICC line projects over RIGHT atrium; recommend withdrawal 2.5 x 3.0 cm to place tip at approximately the cavoatrial junction. Otherwise no interval change. Electronically Signed   By: Lavonia Dana M.D.   On: 02/05/2019 22:34   Dg Chest Port 1 View  Result Date: 02/05/2019 CLINICAL DATA:  PICC line placement EXAM: PORTABLE CHEST 1 VIEW COMPARISON:  Portable exam 1613 hours compared to 02/03/2019 FINDINGS: RIGHT arm PICC line tip deflects into the azygos arch. Upper normal heart size. Mediastinal  contours and pulmonary vascularity normal. Atherosclerotic calcification aorta. RIGHT lung infiltrate with small RIGHT pleural effusion and basilar atelectasis. LEFT lung clear. No pneumothorax or acute osseous findings. IMPRESSION: Tip of RIGHT arm PICC line deflects into the azygos arch, recommend repositioning into SVC. Increased RIGHT lung infiltrates, RIGHT pleural effusion and basilar atelectasis. Findings called to [facility] in ICU on 02/05/2019 at 1839 hrs. Electronically Signed   By: Lavonia Dana M.D.   On: 02/05/2019 18:40   Dg Chest Portable 1 View  Result Date: 02/03/2019 CLINICAL DATA:  Shortness of breath. EXAM: PORTABLE CHEST 1 VIEW COMPARISON:  None. FINDINGS: The heart size and pulmonary vascularity are normal. Aortic atherosclerosis. Small right pleural effusion. Focal linear atelectasis in the right midzone. No acute bone abnormality. IMPRESSION: 1. Small right pleural effusion. Slight atelectasis in the right midzone. 2. Aortic atherosclerosis. Electronically Signed   By: Lorriane Shire M.D.   On: 02/03/2019 18:36   [place] Ekg Site Rite  Result  Date: 02/05/2019 If Site Rite image not attached, placement could not be confirmed due to current cardiac rhythm.  US Thoracentesis Asp Pleural Space W/img Guide  Result Date: 02/07/2019 INDICATION: Right pleural effusion, hypertension, diabetes EXAM: ULTRASOUND GUIDED RIGHT THORACENTESIS MEDICATIONS: 1% lidocaine local COMPLICATIONS: None immediate. PROCEDURE: An ultrasound guided thoracentesis was thoroughly discussed with the patient and questions answered. The benefits, risks, alternatives and complications were also discussed. The patient understands and wishes to proceed with the procedure. Written consent was obtained. Ultrasound was performed to localize and mark an adequate pocket of fluid in the right chest. The area was then prepped and draped in the normal sterile fashion. 1% Lidocaine was used for local anesthesia. Under ultrasound guidance a 6 Fr Safe-T-Centesis catheter was introduced. Thoracentesis was performed. The catheter was removed and a dressing applied. FINDINGS: A total of approximately 1.2 L of amber colored pleural fluid was removed. Samples were sent to the laboratory as requested by the clinical team. IMPRESSION: Successful ultrasound guided right thoracentesis yielding 1.2 L of pleural fluid. Electronically Signed   By: Jerilynn Mages.  Shick M.D.   On: 02/07/2019 10:10    Assessment and plan-  Patient is a 78 y.o. female with history of diabetes and hypertension, former smoker currently admitted to ICU due to hyponatremia.  CT chest reviewed moderate to large right pleural effusion associated with consolidation of right lower lobe.  Prominent mediastinal lymph nodes.  #Right pleural effusion, status post ultrasound guided thoracentesis, with removal of 1.2 L of amber-colored pleural effusion.  Cytology is positive for adenocarcinoma, consistent with lung origin.  Stage IV lung adenocarcinoma.  Called daughter a few times this afternoon and not able to reach her. Will discuss treatment  options tomorrow.  She will need to complete staging with PET scan.  Will send ancillary tests on tissue.  Need discussion with patient and family, if they desires treatment, options includes immunotherapy +/- chemotherapy.  PT/OT.    Thank you for allowing me to participate in the care of this patient.   Earlie Server, MD, PhD Hematology Oncology [facility] at [facility] Pager- [phone] 02/10/2019

## 2019-02-11 LAB — GLUCOSE, CAPILLARY
Glucose-Capillary: 106 mg/dL — ABNORMAL HIGH (ref 70–99)
Glucose-Capillary: 125 mg/dL — ABNORMAL HIGH (ref 70–99)
Glucose-Capillary: 106 mg/dL — ABNORMAL HIGH (ref 70–99)
Glucose-Capillary: 118 mg/dL — ABNORMAL HIGH (ref 70–99)

## 2019-02-11 LAB — CBC
HCT: 34.6 % — ABNORMAL LOW (ref 36.0–46.0)
Hemoglobin: 10.7 g/dL — ABNORMAL LOW (ref 12.0–15.0)
MCH: 24.8 pg — ABNORMAL LOW (ref 26.0–34.0)
MCHC: 30.9 g/dL (ref 30.0–36.0)
MCV: 80.3 fL (ref 80.0–100.0)
Platelets: 216 10*3/uL (ref 150–400)
RBC: 4.31 MIL/uL (ref 3.87–5.11)
RDW: 14.6 % (ref 11.5–15.5)
WBC: 5.8 10*3/uL (ref 4.0–10.5)
nRBC: 0 % (ref 0.0–0.2)

## 2019-02-11 LAB — BASIC METABOLIC PANEL
Anion gap: 6 (ref 5–15)
BUN: 13 mg/dL (ref 8–23)
CO2: 37 mmol/L — ABNORMAL HIGH (ref 22–32)
Calcium: 8.9 mg/dL (ref 8.9–10.3)
Chloride: 92 mmol/L — ABNORMAL LOW (ref 98–111)
Creatinine, Ser: 0.38 mg/dL — ABNORMAL LOW (ref 0.44–1.00)
GFR calc Af Amer: >60 mL/min (ref >60)
GFR calc non Af Amer: >60 mL/min (ref >60)
Glucose, Bld: 125 mg/dL — ABNORMAL HIGH (ref 70–99)
Potassium: 4 mmol/L (ref 3.5–5.1)
Sodium: 135 mmol/L (ref 135–145)

## 2019-02-11 LAB — MAGNESIUM: Magnesium: 1.8 mg/dL (ref 1.7–2.4)

## 2019-02-11 LAB — PHOSPHORUS: Phosphorus: 2.6 mg/dL (ref 2.5–4.6)

## 2019-02-11 NOTE — NC FL2 (Signed)
[place] LEVEL OF CARE SCREENING TOOL     IDENTIFICATION  Patient Name: Cathy Buck Birthdate: 1941-05-05 Sex: female Admission Date (Current Location): 02/03/2019  [place] and [place] Number:  Engineering geologist and Address:  [facility], [address], [place], [place] [address]      Provider Number: [id number]  Attending Physician Name and Address:  Henreitta Leber, MD  Relative Name and Phone Number:       Current Level of Care: Hospital Recommended Level of Care: [place] Prior Approval Number:    Date Approved/Denied:   PASRR Number: [id number]  Discharge Plan: SNF    Current Diagnoses: Patient Active Problem List   Diagnosis Date Noted  . Primary adenocarcinoma of lung ([place])   . Pleural effusion   . Weakness   . Hyponatremia 02/03/2019  . HTN (hypertension) 02/03/2019  . Diabetes ([place]) 02/03/2019    Orientation RESPIRATION BLADDER Height & Weight     Self, Time, Place, Situation  O2 Continent Weight: 108 kg Height:  5\' 4"  (162.6 cm)  BEHAVIORAL SYMPTOMS/MOOD NEUROLOGICAL BOWEL NUTRITION STATUS  (none) (none) Continent Diet  AMBULATORY STATUS COMMUNICATION OF NEEDS Skin   Extensive Assist Verbally Skin abrasions                       Personal Care Assistance Level of Assistance  Bathing, Dressing Bathing Assistance: Limited assistance   Dressing Assistance: Limited assistance     Functional Limitations Info  Hearing   Hearing Info: Impaired      SPECIAL CARE FACTORS FREQUENCY  PT (By licensed PT), OT (By licensed OT)     PT Frequency: 5 x wk OT Frequency: 5xwk            Contractures Contractures Info: Not present    Additional Factors Info  Code Status Code Status Info: full. this may change             Current Medications (02/11/2019):  This is the current hospital active medication list Current Facility-Administered Medications  Medication Dose Route  Frequency Provider Last Rate Last Dose  . atenolol (TENORMIN) tablet 50 mg  50 mg Oral Daily Ojie, Jude, MD   50 mg at 02/11/19 0907  . docusate sodium (COLACE) capsule 100 mg  100 mg Oral BID Henreitta Leber, MD   100 mg at 02/11/19 0907  . enalapril (VASOTEC) tablet 20 mg  20 mg Oral BID Stark Jock, Jude, MD   20 mg at 02/11/19 0907  . enoxaparin (LOVENOX) injection 40 mg  40 mg Subcutaneous BID Charlett Nose, RPH   40 mg at 02/11/19 3154  . hydrALAZINE (APRESOLINE) injection 10 mg  10 mg Intravenous Q4H PRN Lance Coon, MD   10 mg at 02/05/19 2318  . insulin aspart (novoLOG) injection 0-5 Units  0-5 Units Subcutaneous QHS Lance Coon, MD      . insulin aspart (novoLOG) injection 0-9 Units  0-9 Units Subcutaneous TID WC Lance Coon, MD   1 Units at 02/11/19 1220  . ipratropium-albuterol (DUONEB) 0.5-2.5 (3) MG/3ML nebulizer solution 3 mL  3 mL Nebulization Q4H PRN Ottie Glazier, MD   3 mL at 02/11/19 0810  . ondansetron (ZOFRAN) tablet 4 mg  4 mg Oral Q6H PRN Lance Coon, MD       Or  . ondansetron [facility]) injection 4 mg  4 mg Intravenous Q6H PRN Lance Coon, MD      . pantoprazole ([place])  EC tablet 40 mg  40 mg Oral Daily Lance Coon, MD   40 mg at 02/11/19 1552  . polyethylene glycol (MIRALAX / GLYCOLAX) packet 17 g  17 g Oral Daily Henreitta Leber, MD   17 g at 02/11/19 0907  . sodium chloride flush (NS) 0.9 % injection 10-40 mL  10-40 mL Intracatheter Q12H Lateef, Munsoor, MD   10 mL at 02/10/19 2125  . sodium chloride flush (NS) 0.9 % injection 10-40 mL  10-40 mL Intracatheter PRN Lateef, Munsoor, MD      . traZODone (DESYREL) tablet 50 mg  50 mg Oral QHS PRN Lance Coon, MD   50 mg at 02/10/19 0802     Discharge Medications: Please see discharge summary for a list of discharge medications.  Relevant Imaging Results:  Relevant Lab Results:   Additional Information SS #[id number]  Katrina Stack, RN

## 2019-02-11 NOTE — NC FL2 (Signed)
[place] LEVEL OF CARE SCREENING TOOL     IDENTIFICATION  Patient Name: Cathy Buck Birthdate: Nov 22, 1940 Sex: female Admission Date (Current Location): 02/03/2019  [place] and [place] Number:  Engineering geologist and Address:  [facility], [address], [place], [place] [address]      Provider Number: [id number]  Attending Physician Name and Address:  Henreitta Leber, MD  Relative Name and Phone Number:       Current Level of Care: Hospital Recommended Level of Care: [place] Prior Approval Number:    Date Approved/Denied:   PASRR Number: [id number]  Discharge Plan: SNF    Current Diagnoses: Patient Active Problem List   Diagnosis Date Noted  . Primary adenocarcinoma of lung ([place])   . Pleural effusion   . Weakness   . Hyponatremia 02/03/2019  . HTN (hypertension) 02/03/2019  . Diabetes ([place]) 02/03/2019    Orientation RESPIRATION BLADDER Height & Weight     Self, Time, Place, Situation  O2 Continent Weight: 108 kg Height:  5\' 4"  (162.6 cm)  BEHAVIORAL SYMPTOMS/MOOD NEUROLOGICAL BOWEL NUTRITION STATUS  (none) (none) Continent Diet  AMBULATORY STATUS COMMUNICATION OF NEEDS Skin   Extensive Assist Verbally Skin abrasions                       Personal Care Assistance Level of Assistance  Bathing, Dressing Bathing Assistance: Limited assistance   Dressing Assistance: Limited assistance     Functional Limitations Info  Hearing   Hearing Info: Impaired      SPECIAL CARE FACTORS FREQUENCY  PT (By licensed PT), OT (By licensed OT)     PT Frequency: 5 x wk OT Frequency: 5xwk            Contractures Contractures Info: Not present    Additional Factors Info  Code Status Code Status Info: full. this may change             Current Medications (02/11/2019):  This is the current hospital active medication list Current Facility-Administered Medications  Medication Dose Route  Frequency Provider Last Rate Last Dose  . atenolol (TENORMIN) tablet 50 mg  50 mg Oral Daily Ojie, Jude, MD   50 mg at 02/11/19 0907  . docusate sodium (COLACE) capsule 100 mg  100 mg Oral BID Henreitta Leber, MD   100 mg at 02/11/19 0907  . enalapril (VASOTEC) tablet 20 mg  20 mg Oral BID Stark Jock, Jude, MD   20 mg at 02/11/19 0907  . enoxaparin (LOVENOX) injection 40 mg  40 mg Subcutaneous BID Charlett Nose, RPH   40 mg at 02/11/19 9622  . hydrALAZINE (APRESOLINE) injection 10 mg  10 mg Intravenous Q4H PRN Lance Coon, MD   10 mg at 02/05/19 2318  . insulin aspart (novoLOG) injection 0-5 Units  0-5 Units Subcutaneous QHS Lance Coon, MD      . insulin aspart (novoLOG) injection 0-9 Units  0-9 Units Subcutaneous TID WC Lance Coon, MD   1 Units at 02/11/19 1220  . ipratropium-albuterol (DUONEB) 0.5-2.5 (3) MG/3ML nebulizer solution 3 mL  3 mL Nebulization Q4H PRN Ottie Glazier, MD   3 mL at 02/11/19 0810  . ondansetron (ZOFRAN) tablet 4 mg  4 mg Oral Q6H PRN Lance Coon, MD       Or  . ondansetron [facility]) injection 4 mg  4 mg Intravenous Q6H PRN Lance Coon, MD      . pantoprazole ([place])  EC tablet 40 mg  40 mg Oral Daily Lance Coon, MD   40 mg at 02/11/19 0347  . polyethylene glycol (MIRALAX / GLYCOLAX) packet 17 g  17 g Oral Daily Henreitta Leber, MD   17 g at 02/11/19 0907  . sodium chloride flush (NS) 0.9 % injection 10-40 mL  10-40 mL Intracatheter Q12H Lateef, Munsoor, MD   10 mL at 02/10/19 2125  . sodium chloride flush (NS) 0.9 % injection 10-40 mL  10-40 mL Intracatheter PRN Lateef, Munsoor, MD      . traZODone (DESYREL) tablet 50 mg  50 mg Oral QHS PRN Lance Coon, MD   50 mg at 02/10/19 4259     Discharge Medications: Please see discharge summary for a list of discharge medications.  Relevant Imaging Results:  Relevant Lab Results:   Additional Information SS #[id number]  Katrina Stack, RN

## 2019-02-11 NOTE — Progress Notes (Signed)
Pharmacy Electrolyte Monitoring Consult:  Pharmacy consulted to assist in monitoring and replacing electrolytes in this 78 y.o. 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   Labs:  Sodium (mmol/L)  Date Value  02/11/2019 135   Potassium (mmol/L)  Date Value  02/11/2019 4.0   Magnesium (mg/dL)  Date Value  02/11/2019 1.8   Phosphorus (mg/dL)  Date Value  02/11/2019 2.6   Calcium (mg/dL)  Date Value  02/11/2019 8.9   Albumin (g/dL)  Date Value  02/08/2019 3.1 (L)    Assessment/Plan:  Hypertonic saline stopped on 7/27  K 4.0, Mag 1.8, Phos 2.6 - No further electrolyte supplementation warranted at this time  F/U electrolyte levels in am  Pharmacy will continue to monitor and adjust per consult.   Rocky Morel, PharmD, BCPS 02/11/2019 8:32 AM

## 2019-02-11 NOTE — Progress Notes (Signed)
Hematology/Oncology Progress Note [facility] Telephone:([phone] [phone]  Patient Care Team: Sallee Lange, NP as PCP - General (Internal Medicine)   Name of the patient: Cathy Buck  [id number]  03-01-1941  Date of visit: 02/11/19   INTERVAL HISTORY-  Patient is sitting the bed, breathing comfortably via nasal cannula oxygen. She just had lunch.  Appetite is fair.   Review of systems- Review of Systems  Constitutional: Positive for fatigue.  Respiratory: Positive for shortness of breath. Negative for cough.   Gastrointestinal: Negative for abdominal pain.  Genitourinary: Negative for difficulty urinating.   Musculoskeletal: Positive for arthralgias.  Skin: Negative for itching.  Neurological: Negative for dizziness.  Psychiatric/Behavioral: Negative for confusion.    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[place])    Pleural effusion    Weakness    Hyponatremia 02/03/2019   HTN (hypertension) 02/03/2019   Diabetes ([place]) 02/03/2019     Past Medical History:  Diagnosis Date   Diabetes mellitus without complication ([place])    Hypertension      Past Surgical History:  Procedure Laterality Date   ABDOMINAL HYSTERECTOMY     CHOLECYSTECTOMY      Social History   Socioeconomic History   Marital status: Married    Spouse name: Not on file   Number of children: Not on file   Years of education: Not on file   Highest education level: Not on file  Occupational History   Not on file  Social Needs   Financial resource strain: Not hard at all   Food insecurity    Worry: Never true    Inability: Never true   Transportation needs    Medical: No   Non-medical: No  Tobacco Use   Smoking status: Former Smoker   Smokeless tobacco: Never Used  Substance and Sexual Activity   Alcohol use: Not Currently   Drug use: Not Currently   Sexual activity: Not Currently  Lifestyle   Physical activity    Days per week: 0 days    Minutes per session: 0 min   Stress: Not at all  Relationships   Social connections    Talks on phone: Once a week    Gets together: Once a week    Attends religious service: 1 to 4 times per year    Active member of club or organization: No    Attends meetings of clubs or organizations: Never    Relationship status: Married   Intimate partner violence    Fear of current or ex partner: No    Emotionally abused: No    Physically abused: No    Forced sexual activity: No  Other Topics Concern   Not on file  Social History Narrative   Lives at home with husband.  Uses cane, walker and rollator a times     History reviewed. No pertinent family history.   Current Facility-Administered Medications:    atenolol (TENORMIN) tablet 50 mg, 50 mg, Oral, Daily, Ojie, Jude, MD, 50 mg at 02/11/19 0907   docusate sodium (COLACE) capsule 100 mg, 100 mg, Oral, BID, Sainani, Vivek J, MD, 100 mg at 02/11/19 0907   enalapril (VASOTEC) tablet 20 mg, 20 mg, Oral, BID, Ojie,  Jude, MD, 20 mg at 02/11/19 0907   enoxaparin (LOVENOX) injection 40 mg, 40 mg, Subcutaneous, BID, Charlett Nose, RPH, 40 mg at 02/11/19 0254   hydrALAZINE (APRESOLINE) injection 10 mg, 10 mg, Intravenous, Q4H PRN, Lance Coon, MD, 10 mg at 02/05/19 2318   insulin aspart (novoLOG) injection 0-5 Units, 0-5 Units, Subcutaneous, QHS, Lance Coon, MD   insulin aspart (novoLOG) injection 0-9 Units, 0-9 Units, Subcutaneous, TID WC, Lance Coon, MD, 1 Units at 02/11/19 1220   ipratropium-albuterol (DUONEB) 0.5-2.5 (3) MG/3ML nebulizer solution 3 mL, 3 mL, Nebulization, Q4H PRN, Ottie Glazier, MD, 3 mL at 02/11/19 0810   ondansetron  (ZOFRAN) tablet 4 mg, 4 mg, Oral, Q6H PRN **OR** ondansetron (ZOFRAN) injection 4 mg, 4 mg, Intravenous, Q6H PRN, Lance Coon, MD   pantoprazole (PROTONIX) EC tablet 40 mg, 40 mg, Oral, Daily, Lance Coon, MD, 40 mg at 02/11/19 0907   polyethylene glycol (MIRALAX / GLYCOLAX) packet 17 g, 17 g, Oral, Daily, Sainani, Vivek J, MD, 17 g at 02/11/19 0907   sodium chloride flush (NS) 0.9 % injection 10-40 mL, 10-40 mL, Intracatheter, Q12H, Lateef, Munsoor, MD, 10 mL at 02/10/19 2125   sodium chloride flush (NS) 0.9 % injection 10-40 mL, 10-40 mL, Intracatheter, PRN, Lateef, Munsoor, MD   traZODone (DESYREL) tablet 50 mg, 50 mg, Oral, QHS PRN, Lance Coon, MD, 50 mg at 02/10/19 0057   Physical exam:  Vitals:   02/11/19 0812 02/11/19 0846 02/11/19 0908 02/11/19 1137  BP:  137/73  117/71  Pulse:    66  Resp:      Temp:    98.2 F (36.8 C)  TempSrc:    Oral  SpO2: 98% 91% (!) 86% 100%  Weight:      Height:       Physical Exam  Constitutional: She is oriented to person, place, and time. No distress.  HENT:  Head: Normocephalic and atraumatic.  Nose: Nose normal.  Mouth/Throat: Oropharynx is clear and moist. No oropharyngeal exudate.  Eyes: Pupils are equal, round, and reactive to light. EOM are normal. No scleral icterus.  Neck: Normal range of motion. Neck supple.  Cardiovascular: Normal rate and regular rhythm.  No murmur heard. Pulmonary/Chest: Effort normal. No respiratory distress. She has no rales. She exhibits no tenderness.  Decreased breath sounds.  Abdominal: Soft. Bowel sounds are normal. She exhibits no distension. There is no abdominal tenderness.  Musculoskeletal: Normal range of motion.        General: No edema.  Neurological: She is alert and oriented to person, place, and time. No cranial nerve deficit. She exhibits normal muscle tone. Coordination normal.  Orientated to name, place  Skin: Skin is warm and dry. She is not diaphoretic. No erythema.  Psychiatric:  Affect normal.       CMP Latest Ref Rng & Units 02/11/2019  Glucose 70 - 99 mg/dL 125(H)  BUN 8 - 23 mg/dL 13  Creatinine 0.44 - 1.00 mg/dL 0.38(L)  Sodium 135 - 145 mmol/L 135  Potassium 3.5 - 5.1 mmol/L 4.0  Chloride 98 - 111 mmol/L 92(L)  CO2 22 - 32 mmol/L 37(H)  Calcium 8.9 - 10.3 mg/dL 8.9  Total Protein 6.5 - 8.1 g/dL -  Total Bilirubin 0.3 - 1.2 mg/dL -  Alkaline Phos 38 - 126 U/L -  AST 15 - 41 U/L -  ALT 0 - 44 U/L -   CBC Latest Ref Rng & Units 02/11/2019  WBC 4.0 - 10.5 K/uL 5.8  Hemoglobin 12.0 -  15.0 g/dL 10.7(L)  Hematocrit 36.0 - 46.0 % 34.6(L)  Platelets 150 - 400 K/uL 216   RADIOGRAPHIC STUDIES: I have personally reviewed the radiological images as listed and agreed with the findings in the report.  Dg Chest 2 View  Result Date: 02/10/2019 CLINICAL DATA:  Pleural effusion EXAM: CHEST - 2 VIEW COMPARISON:  Two days ago FINDINGS: Right PICC with tip at the right atrium. Haziness in the lower right chest. Hyperinflation with diaphragm flattening. Thickened right hilum. There was adenopathy on preceding chest CT. Normal heart size. IMPRESSION: Unchanged right pleural effusion and lower lobe opacification. Electronically Signed   By: Monte Fantasia M.D.   On: 02/10/2019 10:39   Ct Chest Wo Contrast  Result Date: 02/04/2019 CLINICAL DATA:  Shortness of breath EXAM: CT CHEST WITHOUT CONTRAST TECHNIQUE: Multidetector CT imaging of the chest was performed following the standard protocol without IV contrast. COMPARISON:  Plain film from previous day FINDINGS: Cardiovascular: Somewhat limited due to lack of IV contrast. Aortic calcifications are noted without significant aneurysmal dilatation. No cardiac enlargement is seen. No pericardial effusion is noted. Mediastinum/Nodes: The esophagus is within normal limits. No definitive hilar adenopathy is noted. There is a right paratracheal node which measures approximately 16 mm in short axis as well as a subcarinal node which  measures 17 mm in short axis. These are likely reactive in nature. Thoracic inlet is within normal limits. Lungs/Pleura: The left lung is well aerated with minimal atelectatic changes in the lingula along the cardiac border. No sizable effusion is noted. The right hemithorax demonstrates a moderate to large pleural effusion increased from the prior exam. Underlying patchy infiltrate is noted throughout the right lung with more marked consolidation in the right lung base. No definitive nodule is noted. Upper Abdomen: Somewhat limited due to lack of IV contrast. No definitive abnormality is noted. Musculoskeletal: Mild degenerative change of the thoracic spine is noted. No acute bony abnormality is seen. IMPRESSION: Moderate to large right-sided pleural effusion with associated consolidation most marked in the right lower lobe. Mild left basilar atelectasis. Prominent mediastinal lymph nodes likely reactive in nature. Aortic Atherosclerosis (ICD10-I70.0). Electronically Signed   By: Inez Catalina M.D.   On: 02/04/2019 15:36   Mr Brain Wo Contrast  Result Date: 02/07/2019 CLINICAL DATA:  Initial evaluation for acute confusion, encephalopathy. EXAM: MRI HEAD WITHOUT CONTRAST TECHNIQUE: Multiplanar, multiecho pulse sequences of the brain and surrounding structures were obtained without intravenous contrast. COMPARISON:  None available. FINDINGS: Brain: Examination mildly degraded by motion artifact. Diffuse prominence of the CSF containing spaces compatible with generalized age-related cerebral atrophy. Mild scattered patchy T2/FLAIR hyperintensity within the periventricular deep white matter both cerebral hemispheres, most consistent with chronic microvascular ischemic disease, mild for age. No abnormal foci of restricted diffusion to suggest acute or subacute ischemia. Gray-white matter differentiation maintained. No encephalomalacia to suggest chronic cortical infarction. No foci of susceptibility artifact to  suggest acute or chronic intracranial hemorrhage. No mass lesion, midline shift or mass effect. No hydrocephalus. The extra-axial fluid collection. Pituitary gland within normal limits. Midline structures intact. Vascular: Major intracranial vascular flow voids are maintained Skull and upper cervical spine: Craniocervical junction within normal limits. Multilevel degenerative spondylolysis noted within the visualized upper cervical spine without significant stenosis. Bone marrow signal intensity normal. No scalp soft tissue abnormality. Sinuses/Orbits: Globes and orbital soft tissues within normal limits. Paranasal sinuses are clear. Left mastoid effusion noted. Inner ear structures grossly normal. Other: None. IMPRESSION: 1. No acute intracranial abnormality. 2. Mild  age-related cerebral atrophy with chronic microvascular ischemic disease. 3. Left mastoid effusion, of uncertain significance. Correlation with physical exam and symptomatology for possible otomastoiditis recommended. Electronically Signed   By: Jeannine Boga M.D.   On: 02/07/2019 20:33   Dg Chest Port 1 View  Result Date: 02/08/2019 CLINICAL DATA:  Followup pleural effusion. EXAM: PORTABLE CHEST 1 VIEW COMPARISON:  02/07/2019 and earlier exams. FINDINGS: Opacity at the right lung base mostly obscures hemidiaphragm consistent with a small effusion and atelectasis. Opacity at the left lung base is less prominent than on the previous day's study, the difference likely due to differences in patient positioning only. There is hazy opacity consistent with a smaller left pleural effusion. No new lung abnormalities. No evidence of pulmonary edema. No pneumothorax. Right-sided PICC is stable. IMPRESSION: 1. No significant change from the most recent prior study allowing for differences in patient positioning. 2. Small residual right pleural effusion with associated atelectasis. Smaller left pleural effusion with atelectasis. No convincing pulmonary  edema. No pneumothorax. Electronically Signed   By: Lajean Manes M.D.   On: 02/08/2019 13:25   Dg Chest Port 1 View  Result Date: 02/07/2019 CLINICAL DATA:  Status post right-sided thoracentesis. EXAM: PORTABLE CHEST 1 VIEW COMPARISON:  02/05/2019 FINDINGS: The heart is enlarged but stable. Stable tortuosity and calcification of the thoracic aorta. The right PICC line is stable. Status post right-sided thoracentesis with interval decrease in size of the right pleural effusion. No postprocedural pneumothorax is identified. Small left pleural effusion and bibasilar atelectasis. IMPRESSION: Status post right-sided thoracentesis with near complete evacuation of the right-sided pleural effusion. No postprocedural pneumothorax. Persistent small left effusion and moderate bibasilar atelectasis. Electronically Signed   By: Marijo Sanes M.D.   On: 02/07/2019 10:33   Dg Chest Port 1 View  Result Date: 02/07/2019 CLINICAL DATA:  Shortness of breath. Patient experiencing increased shortness of breath. EXAM: PORTABLE CHEST 1 VIEW COMPARISON:  Portable chest radiograph 02/05/2019 FINDINGS: Unchanged position of a right-sided PICC with tip projecting over the upper right atrium. May consider withdrawing catheter 2.5-3 cm to place tip cavoatrial junction. The cardiomediastinal silhouette is unchanged. Interval increase in size of a right pleural effusion with increasing right basilar atelectasis. Underlying right basilar pneumonia cannot be excluded. Ill-defined opacity throughout the mid to lower right lung has also increased and may reflect incomplete atelectasis and/or developing pneumonia. Mild left basilar atelectasis is unchanged. No evidence of pneumothorax. Degenerative changes of the spine. IMPRESSION: Unchanged position of a right PICC with tip projecting over the right atrium. Consider withdrawing catheter 2.5-3 cm to place tip at cavoatrial junction. Interval increase in size of a right pleural effusion with  increasing right basilar atelectasis. Underlying right basilar pneumonia cannot be excluded. Interval increase in ill-defined opacity throughout the mid to lower right lung may reflect incomplete atelectasis and/or developing pneumonia. Electronically Signed   By: Kellie Simmering   On: 02/07/2019 08:14   Dg Chest Port 1 View  Result Date: 02/05/2019 CLINICAL DATA:  PICC line placement EXAM: PORTABLE CHEST 1 VIEW COMPARISON:  Portable exam 2225 hours compared to 1631 hours FINDINGS: RIGHT arm PICC line with tip projecting over superior RIGHT atrium. May consider withdrawing catheter 2.5-3.0 cm to place tip at cavoatrial junction. Stable heart size and mediastinal contours. RIGHT pleural effusion and basilar atelectasis with questionable RIGHT perihilar infiltrate. Minimal LEFT base atelectasis. IMPRESSION: Tip of RIGHT arm PICC line projects over RIGHT atrium; recommend withdrawal 2.5 x 3.0 cm to place tip at approximately the  cavoatrial junction. Otherwise no interval change. Electronically Signed   By: Lavonia Dana M.D.   On: 02/05/2019 22:34   Dg Chest Port 1 View  Result Date: 02/05/2019 CLINICAL DATA:  PICC line placement EXAM: PORTABLE CHEST 1 VIEW COMPARISON:  Portable exam 1613 hours compared to 02/03/2019 FINDINGS: RIGHT arm PICC line tip deflects into the azygos arch. Upper normal heart size. Mediastinal contours and pulmonary vascularity normal. Atherosclerotic calcification aorta. RIGHT lung infiltrate with small RIGHT pleural effusion and basilar atelectasis. LEFT lung clear. No pneumothorax or acute osseous findings. IMPRESSION: Tip of RIGHT arm PICC line deflects into the azygos arch, recommend repositioning into SVC. Increased RIGHT lung infiltrates, RIGHT pleural effusion and basilar atelectasis. Findings called to [facility] in ICU on 02/05/2019 at 1839 hrs. Electronically Signed   By: Lavonia Dana M.D.   On: 02/05/2019 18:40   Dg Chest Portable 1 View  Result Date: 02/03/2019 CLINICAL DATA:   Shortness of breath. EXAM: PORTABLE CHEST 1 VIEW COMPARISON:  None. FINDINGS: The heart size and pulmonary vascularity are normal. Aortic atherosclerosis. Small right pleural effusion. Focal linear atelectasis in the right midzone. No acute bone abnormality. IMPRESSION: 1. Small right pleural effusion. Slight atelectasis in the right midzone. 2. Aortic atherosclerosis. Electronically Signed   By: Lorriane Shire M.D.   On: 02/03/2019 18:36   [place] Ekg Site Rite  Result Date: 02/05/2019 If Site Rite image not attached, placement could not be confirmed due to current cardiac rhythm.  US Thoracentesis Asp Pleural Space W/img Guide  Result Date: 02/07/2019 INDICATION: Right pleural effusion, hypertension, diabetes EXAM: ULTRASOUND GUIDED RIGHT THORACENTESIS MEDICATIONS: 1% lidocaine local COMPLICATIONS: None immediate. PROCEDURE: An ultrasound guided thoracentesis was thoroughly discussed with the patient and questions answered. The benefits, risks, alternatives and complications were also discussed. The patient understands and wishes to proceed with the procedure. Written consent was obtained. Ultrasound was performed to localize and mark an adequate pocket of fluid in the right chest. The area was then prepped and draped in the normal sterile fashion. 1% Lidocaine was used for local anesthesia. Under ultrasound guidance a 6 Fr Safe-T-Centesis catheter was introduced. Thoracentesis was performed. The catheter was removed and a dressing applied. FINDINGS: A total of approximately 1.2 L of amber colored pleural fluid was removed. Samples were sent to the laboratory as requested by the clinical team. IMPRESSION: Successful ultrasound guided right thoracentesis yielding 1.2 L of pleural fluid. Electronically Signed   By: Jerilynn Mages.  Shick M.D.   On: 02/07/2019 10:10    Assessment and plan-  Patient is a 79 y.o. female with history of diabetes and hypertension, former smoker currently admitted to ICU due to hyponatremia.   CT chest reviewed moderate to large right pleural effusion associated with consolidation of right lower lobe.  Prominent mediastinal lymph nodes.  #Right pleural effusion, status post ultrasound guided thoracentesis, with removal of 1.2 L of amber-colored pleural effusion.  Cytology is positive for adenocarcinoma, consistent with lung origin.  Stage IV lung adenocarcinoma.  I had a discussion with patient and also had called daughter and discussed about cancer diagnosis, extent of disease.  We discussed that patient's condition is not curable, treatment is with palliative intent. Patient will need to have molecular testing sent on current tissue sample. Patient's mental status has improved.  She knows about the cancer diagnosis.  She expresses interest in further treatments. I had a lengthy discussion with daughter about next steps and possible treatment options. Patient will need to have additional image work-up  to complete staging, she will need a PET scan done outpatient. CT chest during this admission did not show primary lung mass, which can be obscured due to pleural effusion.  Prominent mediastinal lymph node.  Hopefully PET scan will provide additional information. Recommend continue physical therapy, exercise and hopefully she will gain some strength be able to follow-up at the cancer center.She may be a candidate for immunotherapy +/-chemotherapy.  Thank you for allowing me to participate in the care of this patient.  We spent sufficient time to discuss many aspect of care, questions were answered to patient's satisfaction. Total face to face encounter time for this patient visit was35 min. >50% of the time was  spent in counseling and coordination of care.    Earlie Server, MD, PhD Hematology Oncology [place] [facility] 02/11/2019

## 2019-02-11 NOTE — Progress Notes (Signed)
[facility] [place] NAME: Cathy Buck    MR#:  [id number]  DATE OF BIRTH:  August 31, 1940  SUBJECTIVE:   Patient's pathology on the pleural fluid came back for malignancy consistent with adenocarcinoma.  Patient's mental status significantly improved since admission and she is very coherent now.  Denies any worsening shortness of breath cough or any pain.  REVIEW OF SYSTEMS:    Review of Systems  Constitutional: Negative for chills and fever.  HENT: Negative for congestion and tinnitus.   Eyes: Negative for blurred vision and double vision.  Respiratory: Positive for shortness of breath. Negative for cough and wheezing.   Cardiovascular: Negative for chest pain, orthopnea and PND.  Gastrointestinal: Negative for abdominal pain, diarrhea, nausea and vomiting.  Genitourinary: Negative for dysuria and hematuria.  Neurological: Positive for weakness (generalized). Negative for dizziness, sensory change and focal weakness.  All other systems reviewed and are negative.   Nutrition: Heart healthy/carb modified Tolerating Diet: yes little.  Tolerating PT: Eval noted.    DRUG ALLERGIES:   Allergies  Allergen Reactions  . Codeine Hives  . Atorvastatin Other (See Comments)  . Citalopram Other (See Comments)    Nausea  . Keflex [Cephalexin] Diarrhea  . Tylenol [Acetaminophen] Hives    VITALS:  Blood pressure 117/71, pulse 66, temperature 98.2 F (36.8 C), temperature source Oral, resp. rate (!) 2, height 5\' 4"  (1.626 m), weight 108 kg, SpO2 100 %.  PHYSICAL EXAMINATION:   Physical Exam  GENERAL:  78 y.o.-year-old patient lying in bed in NAD.  EYES: Pupils equal, round, reactive to light and accommodation. No scleral icterus. Extraocular muscles intact.  HEENT: Head atraumatic, normocephalic. Oropharynx and nasopharynx clear.  NECK:  Supple, no jugular venous distention. No thyroid enlargement, no tenderness.  LUNGS: Good a/e b/l,  no  wheezing, rales, rhonchi. No use of accessory muscles of respiration.  CARDIOVASCULAR: S1, S2 normal. No murmurs, rubs, or gallops.  ABDOMEN: Soft, nontender, nondistended. Bowel sounds present. No organomegaly or mass.  EXTREMITIES: No cyanosis, clubbing or edema b/l.    NEUROLOGIC: Cranial nerves II through XII are intact. No focal Motor or sensory deficits b/l. Globally weak.   PSYCHIATRIC: The patient is alert and oriented x 3.  SKIN: No obvious rash, lesion, or ulcer.    LABORATORY PANEL:   CBC Recent Labs  Lab 02/11/19 0452  WBC 5.8  HGB 10.7*  HCT 34.6*  PLT 216   ------------------------------------------------------------------------------------------------------------------  Chemistries  Recent Labs  Lab 02/08/19 0539  02/11/19 0452  NA 133*   < > 135  K 4.2   < > 4.0  CL 95*   < > 92*  CO2 30   < > 37*  GLUCOSE 109*   < > 125*  BUN 14   < > 13  CREATININE 0.50   < > 0.38*  CALCIUM 9.3   < > 8.9  MG 2.0   < > 1.8  AST 17  --   --   ALT 19  --   --   ALKPHOS 51  --   --   BILITOT 0.8  --   --    < > = values in this interval not displayed.   ------------------------------------------------------------------------------------------------------------------  Cardiac Enzymes No results for input(s): TROPONINI in the last 168 hours. ------------------------------------------------------------------------------------------------------------------  RADIOLOGY:  Dg Chest 2 View  Result Date: 02/10/2019 CLINICAL DATA:  Pleural effusion EXAM: CHEST - 2 VIEW COMPARISON:  Two days ago  FINDINGS: Right PICC with tip at the right atrium. Haziness in the lower right chest. Hyperinflation with diaphragm flattening. Thickened right hilum. There was adenopathy on preceding chest CT. Normal heart size. IMPRESSION: Unchanged right pleural effusion and lower lobe opacification. Electronically Signed   By: Monte Fantasia M.D.   On: 02/10/2019 10:39     ASSESSMENT AND PLAN:    78 year old female with past medical history of diabetes, hypertension who presents to the hospital due to shortness of breath and weakness and noted to be severely hyponatremic.  1.  Acute respiratory failure with hypoxia-secondary to a right-sided pleural effusion. -Patient underwent CT of the chest on admission which showed a moderate to large right-sided pleural effusion with associated consolidation. -Status post ultrasound-guided thoracentesis with 1.3 L of fluid removed. -Cytology on the pleural fluid consistent with Adenocarcinoma. - Oncology has been consulted and will do outpatient staging and discuss treatment options with family as outpatient.  - O2 sats stable and will cont. To monitor.   2.  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 -Seen by nephrology and status post 3% saline and sodium has now normalized.  -Nephrology has signed off, sodium level stable.  3.  Pleural effusion- status post thoracentesis.  Cytology consistent with adenocarcinoma.  Seen by oncology and discussed with Dr. Tasia Catchings and plan for doing outpatient staging PET/CT and treatment options with family.  4. Essential HTN - cont. Atenolol, Vasotec.  - BP stable.   5. DM Type II w/out complication - cont. SSI and follow BS  6. GERD - cont. Protonix.   7.  Altered mental status/lethargy-etiology unclear but suspect to be multifactorial related to underlying hyponatremia, possible underlying dementia/cognitive decline. - MRI was negative and mental status back to baseline now.   Seen by physical therapy and they recommend short-term rehab, family is in agreement.  Social work doing Pension scheme manager.  Discussed plan of care with patient's daughter over the phone.  All the records are reviewed and case discussed with Care Management/Social Worker. Management plans discussed with the patient, family and they are in agreement.  CODE STATUS: Full code  DVT Prophylaxis: Lovenox  TOTAL TIME TAKING CARE OF THIS PATIENT: 30  minutes.   POSSIBLE D/C 2-3 DAYS, DEPENDING ON CLINICAL CONDITION and progress.   Henreitta Leber M.D on 02/11/2019 at 3:02 PM  Between 7am to 6pm - Pager - [phone]  After 6pm go to [email or web address] - Proofreader  [facility]  Office  [phone]  CC: Primary care physician; Sallee Lange, NP

## 2019-02-11 NOTE — TOC Progression Note (Signed)
[facility]    Patient Details  Name: Cathy Buck MRN: [id number] Date of Birth: 1941/02/24  [facility] CM/SW Contact  Katrina Stack, RN Phone Number: 02/11/2019, 6:54 PM  Clinical Narrative:   Patient moved to [facility] 7/29. Spoke to her daughter [name] regarding physical therapy recommendation of skilled nursing placement short term.  She is in agreement with bed search. Obtained passr.     Expected Discharge Plan: [place]    Expected Discharge Plan and Services Expected Discharge Plan: [place]       Living arrangements for the past 2 months: Single Family Home                                       Social Determinants of Health (SDOH) Interventions    Readmission Risk Interventions No flowsheet data found.

## 2019-02-11 NOTE — Progress Notes (Signed)
Per attending MD, it is okay to DC telemetry monitor and foley catheter.

## 2019-02-12 LAB — GLUCOSE, CAPILLARY
Glucose-Capillary: 103 mg/dL — ABNORMAL HIGH (ref 70–99)
Glucose-Capillary: 153 mg/dL — ABNORMAL HIGH (ref 70–99)
Glucose-Capillary: 128 mg/dL — ABNORMAL HIGH (ref 70–99)
Glucose-Capillary: 62 mg/dL — ABNORMAL LOW (ref 70–99)
Glucose-Capillary: 130 mg/dL — ABNORMAL HIGH (ref 70–99)

## 2019-02-12 LAB — BASIC METABOLIC PANEL
Anion gap: 5 (ref 5–15)
BUN: 13 mg/dL (ref 8–23)
CO2: 39 mmol/L — ABNORMAL HIGH (ref 22–32)
Calcium: 9.2 mg/dL (ref 8.9–10.3)
Chloride: 92 mmol/L — ABNORMAL LOW (ref 98–111)
Creatinine, Ser: 0.39 mg/dL — ABNORMAL LOW (ref 0.44–1.00)
GFR calc Af Amer: >60 mL/min (ref >60)
GFR calc non Af Amer: >60 mL/min (ref >60)
Glucose, Bld: 128 mg/dL — ABNORMAL HIGH (ref 70–99)
Potassium: 4.3 mmol/L (ref 3.5–5.1)
Sodium: 136 mmol/L (ref 135–145)

## 2019-02-12 LAB — NOVEL CORONAVIRUS, NAA (HOSP ORDER, SEND-OUT TO REF LAB; TAT 18-24 HRS): SARS-CoV-2, NAA: NOT DETECTED

## 2019-02-12 LAB — PHOSPHORUS: Phosphorus: 2.8 mg/dL (ref 2.5–4.6)

## 2019-02-12 LAB — MAGNESIUM: Magnesium: 1.9 mg/dL (ref 1.7–2.4)

## 2019-02-12 MED ORDER — SODIUM CHLORIDE 0.9 % IV BOLUS
250.0000 mL | Freq: Once | INTRAVENOUS | Status: AC
  Administered 2019-02-12: 18:00:00 250 mL via INTRAVENOUS

## 2019-02-12 MED ORDER — BETHANECHOL CHLORIDE 25 MG PO TABS
25.0000 mg | ORAL_TABLET | Freq: Once | ORAL | Status: AC
  Administered 2019-02-12: 25 mg via ORAL
  Filled 2019-02-12: qty 1

## 2019-02-12 NOTE — Progress Notes (Signed)
Hypoglycemic Event  CBG: 62  Treatment: 4 oz juice/soda  Symptoms: None  Follow-up CBG: Time:1729 CBG Result:128  Possible Reasons for Event: Unknown  Comments/MD notified: Yes    Beverly Sessions

## 2019-02-12 NOTE — Progress Notes (Signed)
IS education attempted, pt sleeping. IS left in room will re-attempt later

## 2019-02-12 NOTE — Progress Notes (Signed)
Patient ID: Cathy Buck, female   DOB: 01/06/1941, 78 y.o.   MRN: [id number]  [facility] PROGRESS NOTE  Cathy Buck CHE:[id number] DOB: July 16, 1940 DOA: 02/03/2019 PCP: Sallee Lange, NP  HPI/Subjective: Patient feels okay.  Offers no complaints.  Some cough.  Objective: Vitals:   02/12/19 0919 02/12/19 1248  BP: (!) 132/56 139/73  Pulse: 80 64  Resp:  16  Temp: 98.9 F (37.2 C) 97.8 F (36.6 C)  SpO2: 98% 100%    Filed Weights   02/03/19 1729 02/05/19 1414  Weight: 96.6 kg 108 kg    ROS: Review of Systems  Constitutional: Negative for chills and fever.  Eyes: Negative for blurred vision.  Respiratory: Positive for cough. Negative for shortness of breath.   Cardiovascular: Negative for chest pain.  Gastrointestinal: Negative for abdominal pain, constipation, diarrhea, nausea and vomiting.  Genitourinary: Negative for dysuria.  Musculoskeletal: Negative for joint pain.  Neurological: Negative for dizziness and headaches.   Exam: Physical Exam  Constitutional: She is oriented to person, place, and time.  HENT:  Nose: No mucosal edema.  Mouth/Throat: No oropharyngeal exudate or posterior oropharyngeal edema.  Eyes: Pupils are equal, round, and reactive to light. Conjunctivae, EOM and lids are normal.  Neck: No JVD present. Carotid bruit is not present. No edema present. No thyroid mass and no thyromegaly present.  Cardiovascular: S1 normal and S2 normal. Exam reveals no gallop.  Murmur heard.  Systolic murmur is present with a grade of 2/6. Pulses:      Dorsalis pedis pulses are 2+ on the right side and 2+ on the left side.  Respiratory: No respiratory distress. She has decreased breath sounds in the right lower field and the left lower field. She has no wheezes. She has no rhonchi. She has no rales.  GI: Soft. Bowel sounds are normal. There is no abdominal tenderness.  Musculoskeletal:     Right ankle: She exhibits swelling.     Left ankle: She exhibits  swelling.  Lymphadenopathy:    She has no cervical adenopathy.  Neurological: She is alert and oriented to person, place, and time. No cranial nerve deficit.  Skin: Skin is warm. No rash noted. Nails show no clubbing.  Psychiatric: She has a normal mood and affect.      Data Reviewed: Basic Metabolic Panel: Recent Labs  Lab 02/08/19 0539  02/09/19 0402  02/09/19 1319  02/10/19 0157 02/10/19 0545 02/10/19 1958 02/11/19 0452 02/12/19 0645  NA 133*   < > 134*   < > 133*   < > 130* 132* 134* 135 136  K 4.2  --  3.7  --   --   --   --  3.8 3.9 4.0 4.3  CL 95*  --  94*  --   --   --   --  91*  --  92* 92*  CO2 30  --  32  --   --   --   --  35*  --  37* 39*  GLUCOSE 109*  --  103*  --   --   --   --  132*  --  125* 128*  BUN 14  --  13  --   --   --   --  10  --  13 13  CREATININE 0.50  --  0.40*  --   --   --   --  0.36*  --  0.38* 0.39*  CALCIUM 9.3  --  9.2  --   --   --   --  8.7*  --  8.9 9.2  MG 2.0  --  1.9  --   --   --   --  1.8  --  1.8 1.9  PHOS  --    < > 1.5*  --  2.3*  --   --  2.3* 2.5 2.6 2.8   < > = values in this interval not displayed.   Liver Function Tests: Recent Labs  Lab 02/08/19 0539  AST 17  ALT 19  ALKPHOS 51  BILITOT 0.8  PROT 5.5*  ALBUMIN 3.1*   CBC: Recent Labs  Lab 02/06/19 0539 02/07/19 0454 02/08/19 0539 02/11/19 0452  WBC 6.9 6.3 6.3 5.8  NEUTROABS  --  4.6 4.6  --   HGB 12.0 12.0 11.3* 10.7*  HCT 37.5 39.2 36.9 34.6*  MCV 77.2* 80.5 80.4 80.3  PLT 264 270 240 216   CBG: Recent Labs  Lab 02/11/19 1134 02/11/19 1644 02/11/19 2116 02/12/19 0740 02/12/19 1240  GLUCAP 125* 106* 118* 103* 153*    Recent Results (from the past 240 hour(s))  SARS Coronavirus 2 (CEPHEID- Performed in [facility] hospital lab), Hosp Order     Status: None   Collection Time: 02/03/19  6:55 PM   Specimen: Nasopharyngeal Swab  Result Value Ref Range Status   SARS Coronavirus 2 NEGATIVE NEGATIVE Final    Comment: (NOTE) If result is  NEGATIVE SARS-CoV-2 target nucleic acids are NOT DETECTED. The SARS-CoV-2 RNA is generally detectable in upper and lower  respiratory specimens during the acute phase of infection. The lowest  concentration of SARS-CoV-2 viral copies this assay can detect is 250  copies / mL. A negative result does not preclude SARS-CoV-2 infection  and should not be used as the sole basis for treatment or other  patient management decisions.  A negative result may occur with  improper specimen collection / handling, submission of specimen other  than nasopharyngeal swab, presence of viral mutation(s) within the  areas targeted by this assay, and inadequate number of viral copies  (<250 copies / mL). A negative result must be combined with clinical  observations, patient history, and epidemiological information. If result is POSITIVE SARS-CoV-2 target nucleic acids are DETECTED. The SARS-CoV-2 RNA is generally detectable in upper and lower  respiratory specimens dur ing the acute phase of infection.  Positive  results are indicative of active infection with SARS-CoV-2.  Clinical  correlation with patient history and other diagnostic information is  necessary to determine patient infection status.  Positive results do  not rule out bacterial infection or co-infection with other viruses. If result is PRESUMPTIVE POSTIVE SARS-CoV-2 nucleic acids MAY BE PRESENT.   A presumptive positive result was obtained on the submitted specimen  and confirmed on repeat testing.  While 2019 novel coronavirus  (SARS-CoV-2) nucleic acids may be present in the submitted sample  additional confirmatory testing may be necessary for epidemiological  and / or clinical management purposes  to differentiate between  SARS-CoV-2 and other Sarbecovirus currently known to infect humans.  If clinically indicated additional testing with an alternate test  methodology [phone]) is advised. The SARS-CoV-2 RNA is generally  detectable  in upper and lower respiratory sp ecimens during the acute  phase of infection. The expected result is Negative. Fact Sheet for Patients:  StrictlyIdeas.no Fact Sheet for Healthcare Providers: BankingDealers.co.za This test is not yet approved or cleared by the [place] FDA and has been authorized for detection and/or diagnosis of SARS-CoV-2 by FDA under an  Emergency Use Authorization (EUA).  This EUA will remain in effect (meaning this test can be used) for the duration of the COVID-19 declaration under Section 564(b)(1) of the Act, 21 U.S.C. section 360bbb-3(b)(1), unless the authorization is terminated or revoked sooner. Performed at [facility], [address] [place], [place] [address]   MRSA PCR Screening     Status: None   Collection Time: 02/05/19  2:17 PM   Specimen: Nasopharyngeal  Result Value Ref Range Status   MRSA by PCR NEGATIVE NEGATIVE Final    Comment:        The GeneXpert MRSA Assay (FDA approved for NASAL specimens only), is one component of a comprehensive MRSA colonization surveillance program. It is not intended to diagnose MRSA infection nor to guide or monitor treatment for MRSA infections. Performed at [facility], [address] [place], [place] [address]   Body fluid culture     Status: None   Collection Time: 02/07/19  8:09 AM   Specimen: Pleura; Body Fluid  Result Value Ref Range Status   Specimen Description   Final    PLEURAL Performed at [facility], [address] [place], [place] [address]    Special Requests   Final    PLEURAL Performed at [facility], [address] [place], [place] [address]    Gram Stain   Final    RARE WBC PRESENT,BOTH PMN AND MONONUCLEAR NO ORGANISMS SEEN    Culture   Final    NO GROWTH 3 DAYS Performed at [facility], [place] [address], [place], [place] [address]    Report Status 02/10/2019 FINAL  Final   Novel Coronavirus, NAA (hospital order; send-out to ref lab)     Status: None   Collection Time: 02/11/19  5:15 PM   Specimen: Nasopharyngeal Swab; Respiratory  Result Value Ref Range Status   SARS-CoV-2, NAA NOT DETECTED NOT DETECTED Final    Comment: (NOTE) This test was developed and its performance characteristics determined by [employer]. This test has not been FDA cleared or approved. This test has been authorized by FDA under an Emergency Use Authorization (EUA). This test is only authorized for the duration of time the declaration that circumstances exist justifying the authorization of the emergency use of in vitro diagnostic tests for detection of SARS-CoV-2 virus and/or diagnosis of COVID-19 infection under section 564(b)(1) of the Act, 21 U.S.C. 696EXB-2(W)(4), unless the authorization is terminated or revoked sooner. When diagnostic testing is negative, the possibility of a false negative result should be considered in the context of a patient's recent exposures and the presence of clinical signs and symptoms consistent with COVID-19. An individual without symptoms of COVID-19 and who is not shedding SARS-CoV-2 virus would expect to have a negative (not detected) result in this assay. Performed  At: [facility] [place], [place] [id number] Rush Farmer MD VO:[id number]    [place]  Final    Comment: Performed at [facility], [address] [place], [place] [address]     Scheduled Meds: . atenolol  50 mg Oral Daily  . docusate sodium  100 mg Oral BID  . enalapril  20 mg Oral BID  . enoxaparin (LOVENOX) injection  40 mg Subcutaneous BID  . insulin aspart  0-5 Units Subcutaneous QHS  . insulin aspart  0-9 Units Subcutaneous TID WC  . pantoprazole  40 mg Oral Daily  . polyethylene glycol  17 g Oral Daily  .  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  Patient will need follow-up with Dr. Tasia Catchings as outpatient for PET CT scan staging and treatment options 2. Acute hypoxic respiratory failure secondary to right-sided pleural effusion which turned out to be malignant.  Patient able to come off oxygen today. 3. 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  Patient status post 3% saline. 4. Hypertension on atenolol and Vasotec 5. Type 2 diabetes mellitus without complication on sliding scale 6. GERD on Protonix 7. Acute metabolic encephalopathy secondary to hyponatremia.  This has improved.  MRI brain negative for stroke or metastases but this was done without contrast. 8. Weakness.  Physical therapy recommended rehab  Code Status:     Code Status Orders  (From admission, onward)         Start     Ordered   02/04/19 0009  Full code  Continuous     02/04/19 0009        Code Status History    This patient has a current code status but no historical code status.   Advance Care Planning Activity     Family Communication: Left message for husband Disposition Plan: Out to rehab potentially on Monday  Consultants:  Oncology  Time spent: 28 minutes  [place]

## 2019-02-12 NOTE — Plan of Care (Addendum)
The patient started to void. Normal saline 261ml bolus given. No falls. Assisted to the bedside commode.  Problem: Education: Goal: Knowledge of General Education information will improve Description: Including pain rating scale, medication(s)/side effects and non-pharmacologic comfort measures Outcome: Progressing   Problem: Health Behavior/Discharge Planning: Goal: Ability to manage health-related needs will improve Outcome: Progressing   Problem: Clinical Measurements: Goal: Ability to maintain clinical measurements within normal limits will improve Outcome: Progressing Goal: Diagnostic test results will improve Outcome: Progressing   Problem: Activity: Goal: Risk for activity intolerance will decrease Outcome: Progressing   Problem: Elimination: Goal: Will not experience complications related to urinary retention Outcome: Progressing   Problem: Safety: Goal: Ability to remain free from injury will improve Outcome: Progressing

## 2019-02-12 NOTE — Progress Notes (Signed)
Hematology/Oncology Progress Note [facility] [phone] [phone]  Patient Care Team: Sallee Lange, NP as PCP - General (Internal Medicine)   Name of the patient: Cathy Buck  [id number]  27-Sep-1940  Date of visit: 02/12/19   INTERVAL HISTORY-  No acute overnight event.  Patient sits in the bed, breathing comfortably via nasal cannula oxygen. Just had lunch.  Appetite is fair.    Review of systems- Review of Systems  Constitutional: Positive for fatigue. Negative for appetite change.  Respiratory: Positive for shortness of breath. Negative for cough.   Gastrointestinal: Negative for abdominal pain.  Genitourinary: Negative for difficulty urinating.   Skin: Negative for itching.  Neurological: Negative for dizziness.  Psychiatric/Behavioral: Negative for confusion.    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[place])    Pleural effusion    Weakness    Hyponatremia 02/03/2019   HTN (hypertension) 02/03/2019   Diabetes ([place]) 02/03/2019     Past Medical History:  Diagnosis Date   Diabetes mellitus without complication ([place])    Hypertension      Past Surgical History:  Procedure Laterality Date   ABDOMINAL HYSTERECTOMY     CHOLECYSTECTOMY      Social History   Socioeconomic History   Marital status: Married    Spouse name: Not on file   Number of children: Not on file   Years of education: Not on file   Highest education level: Not on file  Occupational History   Not on file  Social Needs   Financial resource strain: Not hard at all   Food insecurity    Worry: Never true    Inability: Never true   Transportation needs    Medical: No   Non-medical: No  Tobacco Use   Smoking status: Former Smoker   Smokeless tobacco: Never Used  Substance and Sexual Activity   Alcohol use: Not Currently   Drug use: Not Currently   Sexual activity: Not Currently  Lifestyle   Physical activity    Days per week: 0 days    Minutes per session: 0 min   Stress: Not at all  Relationships   Social connections    Talks on phone: Once a week    Gets together: Once a week    Attends religious service: 1 to 4 times per year    Active member of club or organization: No    Attends meetings of clubs or organizations: Never    Relationship status: Married   Intimate partner violence    Fear of current or ex partner: No    Emotionally abused: No    Physically abused: No    Forced sexual activity: No  Other Topics Concern   Not on file  Social History Narrative   Lives at home with husband.  Uses cane, walker and rollator a times     History reviewed. No pertinent family history.   Current Facility-Administered Medications:    atenolol (TENORMIN) tablet 50 mg, 50 mg, Oral, Daily, Ojie, Jude, MD, 50 mg at 02/12/19 0920   docusate sodium (COLACE) capsule 100 mg, 100 mg, Oral, BID, Sainani, Vivek J, MD, 100 mg at 02/12/19 0913   enalapril (VASOTEC) tablet 20 mg, 20  mg, Oral, BID, Ojie, Jude, MD, 20 mg at 02/12/19 0920   enoxaparin (LOVENOX) injection 40 mg, 40 mg, Subcutaneous, BID, Charlett Nose, RPH, 40 mg at 02/12/19 0915   hydrALAZINE (APRESOLINE) injection 10 mg, 10 mg, Intravenous, Q4H PRN, Lance Coon, MD, 10 mg at 02/05/19 2318   insulin aspart (novoLOG) injection 0-5 Units, 0-5 Units, Subcutaneous, QHS, Lance Coon, MD   insulin aspart (novoLOG) injection 0-9 Units, 0-9 Units, Subcutaneous, TID WC, Lance Coon, MD, 2 Units at 02/12/19 1249   ipratropium-albuterol (DUONEB) 0.5-2.5 (3) MG/3ML nebulizer solution 3 mL, 3 mL, Nebulization, Q4H PRN, Ottie Glazier, MD, 3 mL at 02/11/19 0810   ondansetron  (ZOFRAN) tablet 4 mg, 4 mg, Oral, Q6H PRN **OR** ondansetron (ZOFRAN) injection 4 mg, 4 mg, Intravenous, Q6H PRN, Lance Coon, MD   pantoprazole (PROTONIX) EC tablet 40 mg, 40 mg, Oral, Daily, Lance Coon, MD, 40 mg at 02/12/19 0912   polyethylene glycol (MIRALAX / GLYCOLAX) packet 17 g, 17 g, Oral, Daily, Verdell Carmine, Belia Heman, MD, 17 g at 02/12/19 0916   sodium chloride flush (NS) 0.9 % injection 10-40 mL, 10-40 mL, Intracatheter, Q12H, Lateef, Munsoor, MD, 10 mL at 02/12/19 0929   sodium chloride flush (NS) 0.9 % injection 10-40 mL, 10-40 mL, Intracatheter, PRN, Holley Raring, Munsoor, MD   traZODone (DESYREL) tablet 50 mg, 50 mg, Oral, QHS PRN, Lance Coon, MD, 50 mg at 02/10/19 0057   Physical exam:  Vitals:   02/12/19 0529 02/12/19 0914 02/12/19 0919 02/12/19 1248  BP: 124/66 (!) 132/56 (!) 132/56 139/73  Pulse: 71 86 80 64  Resp: 18   16  Temp: 98 F (36.7 C)  98.9 F (37.2 C) 97.8 F (36.6 C)  TempSrc: Oral  Oral Oral  SpO2: 99% 100% 98% 100%  Weight:      Height:       Physical Exam  Constitutional: She is oriented to person, place, and time. No distress.  HENT:  Head: Normocephalic and atraumatic.  Nose: Nose normal.  Mouth/Throat: Oropharynx is clear and moist. No oropharyngeal exudate.  Eyes: Pupils are equal, round, and reactive to light. EOM are normal. No scleral icterus.  Neck: Normal range of motion. Neck supple.  Cardiovascular: Normal rate and regular rhythm.  No murmur heard. Pulmonary/Chest: Effort normal. No respiratory distress. She has no rales. She exhibits no tenderness.  Decreased breath sounds, breathes via nasal cannula oxygen 2 L.  Abdominal: Soft. Bowel sounds are normal. She exhibits no distension. There is no abdominal tenderness.  Musculoskeletal: Normal range of motion.        General: No edema.  Neurological: She is alert and oriented to person, place, and time. No cranial nerve deficit. She exhibits normal muscle tone. Coordination normal.    Orientated to name, place  Skin: Skin is warm and dry. She is not diaphoretic. No erythema.  Psychiatric: Affect normal.       CMP Latest Ref Rng & Units 02/12/2019  Glucose 70 - 99 mg/dL 128(H)  BUN 8 - 23 mg/dL 13  Creatinine 0.44 - 1.00 mg/dL 0.39(L)  Sodium 135 - 145 mmol/L 136  Potassium 3.5 - 5.1 mmol/L 4.3  Chloride 98 - 111 mmol/L 92(L)  CO2 22 - 32 mmol/L 39(H)  Calcium 8.9 - 10.3 mg/dL 9.2  Total Protein 6.5 - 8.1 g/dL -  Total Bilirubin 0.3 - 1.2 mg/dL -  Alkaline Phos 38 - 126 U/L -  AST 15 - 41 U/L -  ALT 0 - 44 U/L -  CBC Latest Ref Rng & Units 02/11/2019  WBC 4.0 - 10.5 K/uL 5.8  Hemoglobin 12.0 - 15.0 g/dL 10.7(L)  Hematocrit 36.0 - 46.0 % 34.6(L)  Platelets 150 - 400 K/uL 216   RADIOGRAPHIC STUDIES: I have personally reviewed the radiological images as listed and agreed with the findings in the report.  Dg Chest 2 View  Result Date: 02/10/2019 CLINICAL DATA:  Pleural effusion EXAM: CHEST - 2 VIEW COMPARISON:  Two days ago FINDINGS: Right PICC with tip at the right atrium. Haziness in the lower right chest. Hyperinflation with diaphragm flattening. Thickened right hilum. There was adenopathy on preceding chest CT. Normal heart size. IMPRESSION: Unchanged right pleural effusion and lower lobe opacification. Electronically Signed   By: Monte Fantasia M.D.   On: 02/10/2019 10:39   Ct Chest Wo Contrast  Result Date: 02/04/2019 CLINICAL DATA:  Shortness of breath EXAM: CT CHEST WITHOUT CONTRAST TECHNIQUE: Multidetector CT imaging of the chest was performed following the standard protocol without IV contrast. COMPARISON:  Plain film from previous day FINDINGS: Cardiovascular: Somewhat limited due to lack of IV contrast. Aortic calcifications are noted without significant aneurysmal dilatation. No cardiac enlargement is seen. No pericardial effusion is noted. Mediastinum/Nodes: The esophagus is within normal limits. No definitive hilar adenopathy is noted. There is a  right paratracheal node which measures approximately 16 mm in short axis as well as a subcarinal node which measures 17 mm in short axis. These are likely reactive in nature. Thoracic inlet is within normal limits. Lungs/Pleura: The left lung is well aerated with minimal atelectatic changes in the lingula along the cardiac border. No sizable effusion is noted. The right hemithorax demonstrates a moderate to large pleural effusion increased from the prior exam. Underlying patchy infiltrate is noted throughout the right lung with more marked consolidation in the right lung base. No definitive nodule is noted. Upper Abdomen: Somewhat limited due to lack of IV contrast. No definitive abnormality is noted. Musculoskeletal: Mild degenerative change of the thoracic spine is noted. No acute bony abnormality is seen. IMPRESSION: Moderate to large right-sided pleural effusion with associated consolidation most marked in the right lower lobe. Mild left basilar atelectasis. Prominent mediastinal lymph nodes likely reactive in nature. Aortic Atherosclerosis (ICD10-I70.0). Electronically Signed   By: Inez Catalina M.D.   On: 02/04/2019 15:36   Mr Brain Wo Contrast  Result Date: 02/07/2019 CLINICAL DATA:  Initial evaluation for acute confusion, encephalopathy. EXAM: MRI HEAD WITHOUT CONTRAST TECHNIQUE: Multiplanar, multiecho pulse sequences of the brain and surrounding structures were obtained without intravenous contrast. COMPARISON:  None available. FINDINGS: Brain: Examination mildly degraded by motion artifact. Diffuse prominence of the CSF containing spaces compatible with generalized age-related cerebral atrophy. Mild scattered patchy T2/FLAIR hyperintensity within the periventricular deep white matter both cerebral hemispheres, most consistent with chronic microvascular ischemic disease, mild for age. No abnormal foci of restricted diffusion to suggest acute or subacute ischemia. Gray-white matter differentiation  maintained. No encephalomalacia to suggest chronic cortical infarction. No foci of susceptibility artifact to suggest acute or chronic intracranial hemorrhage. No mass lesion, midline shift or mass effect. No hydrocephalus. The extra-axial fluid collection. Pituitary gland within normal limits. Midline structures intact. Vascular: Major intracranial vascular flow voids are maintained Skull and upper cervical spine: Craniocervical junction within normal limits. Multilevel degenerative spondylolysis noted within the visualized upper cervical spine without significant stenosis. Bone marrow signal intensity normal. No scalp soft tissue abnormality. Sinuses/Orbits: Globes and orbital soft tissues within normal limits. Paranasal sinuses are clear. Left  mastoid effusion noted. Inner ear structures grossly normal. Other: None. IMPRESSION: 1. No acute intracranial abnormality. 2. Mild age-related cerebral atrophy with chronic microvascular ischemic disease. 3. Left mastoid effusion, of uncertain significance. Correlation with physical exam and symptomatology for possible otomastoiditis recommended. Electronically Signed   By: Jeannine Boga M.D.   On: 02/07/2019 20:33   Dg Chest Port 1 View  Result Date: 02/08/2019 CLINICAL DATA:  Followup pleural effusion. EXAM: PORTABLE CHEST 1 VIEW COMPARISON:  02/07/2019 and earlier exams. FINDINGS: Opacity at the right lung base mostly obscures hemidiaphragm consistent with a small effusion and atelectasis. Opacity at the left lung base is less prominent than on the previous day's study, the difference likely due to differences in patient positioning only. There is hazy opacity consistent with a smaller left pleural effusion. No new lung abnormalities. No evidence of pulmonary edema. No pneumothorax. Right-sided PICC is stable. IMPRESSION: 1. No significant change from the most recent prior study allowing for differences in patient positioning. 2. Small residual right pleural  effusion with associated atelectasis. Smaller left pleural effusion with atelectasis. No convincing pulmonary edema. No pneumothorax. Electronically Signed   By: Lajean Manes M.D.   On: 02/08/2019 13:25   Dg Chest Port 1 View  Result Date: 02/07/2019 CLINICAL DATA:  Status post right-sided thoracentesis. EXAM: PORTABLE CHEST 1 VIEW COMPARISON:  02/05/2019 FINDINGS: The heart is enlarged but stable. Stable tortuosity and calcification of the thoracic aorta. The right PICC line is stable. Status post right-sided thoracentesis with interval decrease in size of the right pleural effusion. No postprocedural pneumothorax is identified. Small left pleural effusion and bibasilar atelectasis. IMPRESSION: Status post right-sided thoracentesis with near complete evacuation of the right-sided pleural effusion. No postprocedural pneumothorax. Persistent small left effusion and moderate bibasilar atelectasis. Electronically Signed   By: Marijo Sanes M.D.   On: 02/07/2019 10:33   Dg Chest Port 1 View  Result Date: 02/07/2019 CLINICAL DATA:  Shortness of breath. Patient experiencing increased shortness of breath. EXAM: PORTABLE CHEST 1 VIEW COMPARISON:  Portable chest radiograph 02/05/2019 FINDINGS: Unchanged position of a right-sided PICC with tip projecting over the upper right atrium. May consider withdrawing catheter 2.5-3 cm to place tip cavoatrial junction. The cardiomediastinal silhouette is unchanged. Interval increase in size of a right pleural effusion with increasing right basilar atelectasis. Underlying right basilar pneumonia cannot be excluded. Ill-defined opacity throughout the mid to lower right lung has also increased and may reflect incomplete atelectasis and/or developing pneumonia. Mild left basilar atelectasis is unchanged. No evidence of pneumothorax. Degenerative changes of the spine. IMPRESSION: Unchanged position of a right PICC with tip projecting over the right atrium. Consider withdrawing  catheter 2.5-3 cm to place tip at cavoatrial junction. Interval increase in size of a right pleural effusion with increasing right basilar atelectasis. Underlying right basilar pneumonia cannot be excluded. Interval increase in ill-defined opacity throughout the mid to lower right lung may reflect incomplete atelectasis and/or developing pneumonia. Electronically Signed   By: Kellie Simmering   On: 02/07/2019 08:14   Dg Chest Port 1 View  Result Date: 02/05/2019 CLINICAL DATA:  PICC line placement EXAM: PORTABLE CHEST 1 VIEW COMPARISON:  Portable exam 2225 hours compared to 1631 hours FINDINGS: RIGHT arm PICC line with tip projecting over superior RIGHT atrium. May consider withdrawing catheter 2.5-3.0 cm to place tip at cavoatrial junction. Stable heart size and mediastinal contours. RIGHT pleural effusion and basilar atelectasis with questionable RIGHT perihilar infiltrate. Minimal LEFT base atelectasis. IMPRESSION: Tip of RIGHT arm  PICC line projects over RIGHT atrium; recommend withdrawal 2.5 x 3.0 cm to place tip at approximately the cavoatrial junction. Otherwise no interval change. Electronically Signed   By: Lavonia Dana M.D.   On: 02/05/2019 22:34   Dg Chest Port 1 View  Result Date: 02/05/2019 CLINICAL DATA:  PICC line placement EXAM: PORTABLE CHEST 1 VIEW COMPARISON:  Portable exam 1613 hours compared to 02/03/2019 FINDINGS: RIGHT arm PICC line tip deflects into the azygos arch. Upper normal heart size. Mediastinal contours and pulmonary vascularity normal. Atherosclerotic calcification aorta. RIGHT lung infiltrate with small RIGHT pleural effusion and basilar atelectasis. LEFT lung clear. No pneumothorax or acute osseous findings. IMPRESSION: Tip of RIGHT arm PICC line deflects into the azygos arch, recommend repositioning into SVC. Increased RIGHT lung infiltrates, RIGHT pleural effusion and basilar atelectasis. Findings called to [facility] in ICU on 02/05/2019 at 1839 hrs. Electronically Signed    By: Lavonia Dana M.D.   On: 02/05/2019 18:40   Dg Chest Portable 1 View  Result Date: 02/03/2019 CLINICAL DATA:  Shortness of breath. EXAM: PORTABLE CHEST 1 VIEW COMPARISON:  None. FINDINGS: The heart size and pulmonary vascularity are normal. Aortic atherosclerosis. Small right pleural effusion. Focal linear atelectasis in the right midzone. No acute bone abnormality. IMPRESSION: 1. Small right pleural effusion. Slight atelectasis in the right midzone. 2. Aortic atherosclerosis. Electronically Signed   By: Lorriane Shire M.D.   On: 02/03/2019 18:36   [place] Ekg Site Rite  Result Date: 02/05/2019 If Site Rite image not attached, placement could not be confirmed due to current cardiac rhythm.  US Thoracentesis Asp Pleural Space W/img Guide  Result Date: 02/07/2019 INDICATION: Right pleural effusion, hypertension, diabetes EXAM: ULTRASOUND GUIDED RIGHT THORACENTESIS MEDICATIONS: 1% lidocaine local COMPLICATIONS: None immediate. PROCEDURE: An ultrasound guided thoracentesis was thoroughly discussed with the patient and questions answered. The benefits, risks, alternatives and complications were also discussed. The patient understands and wishes to proceed with the procedure. Written consent was obtained. Ultrasound was performed to localize and mark an adequate pocket of fluid in the right chest. The area was then prepped and draped in the normal sterile fashion. 1% Lidocaine was used for local anesthesia. Under ultrasound guidance a 6 Fr Safe-T-Centesis catheter was introduced. Thoracentesis was performed. The catheter was removed and a dressing applied. FINDINGS: A total of approximately 1.2 L of amber colored pleural fluid was removed. Samples were sent to the laboratory as requested by the clinical team. IMPRESSION: Successful ultrasound guided right thoracentesis yielding 1.2 L of pleural fluid. Electronically Signed   By: Jerilynn Mages.  Shick M.D.   On: 02/07/2019 10:10    Assessment and plan-  Patient is a 78  y.o. female with history of diabetes and hypertension, former smoker currently admitted to ICU due to hyponatremia.  CT chest reviewed moderate to large right pleural effusion associated with consolidation of right lower lobe.  Prominent mediastinal lymph nodes.  #Right pleural effusion, status post ultrasound guided thoracentesis, with removal of 1.2 L of amber-colored pleural effusion. Cytology is positive for adenocarcinoma, consistent with lung origin.  Stage IV lung adenocarcinoma.  I had discussion with patient and daughter yesterday.  She expresses interest in treatment.   Awaiting patient's overall condition to improve, plan outpatient PET scan to complete staging.  Recommend chest x-ray in a.m. for reevaluation of status of pleural effusion.  #Goals of care, I had discussion with patient today.  She understands that her condition is incurable. She is DNR/DNI.  We discussed that If she  further improves, outpatient immunotherapy +/-chemotherapy can be considered.  We discussed about situation that if she deteriorates, then I recommend comfort care. Continue PT/OT.   Earlie Server, MD, PhD Hematology Oncology [place] [facility] 02/12/2019

## 2019-02-12 NOTE — Progress Notes (Signed)
Pharmacy Electrolyte Monitoring Consult:  Pharmacy consulted to assist in monitoring and replacing electrolytes in this 78 y.o. 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   Labs:  Sodium (mmol/L)  Date Value  02/12/2019 136   Potassium (mmol/L)  Date Value  02/12/2019 4.3   Magnesium (mg/dL)  Date Value  02/12/2019 1.9   Phosphorus (mg/dL)  Date Value  02/12/2019 2.8   Calcium (mg/dL)  Date Value  02/12/2019 9.2   Albumin (g/dL)  Date Value  02/08/2019 3.1 (L)    Assessment/Plan:  Hypertonic saline stopped on 7/27  No further electrolyte supplementation warranted at this time  F/U electrolyte levels in am  Pharmacy will continue to monitor and adjust per consult.   Oswald Hillock, PharmD, BCPS 02/12/2019 7:47 AM

## 2019-02-13 LAB — BASIC METABOLIC PANEL
Anion gap: 5 (ref 5–15)
BUN: 16 mg/dL (ref 8–23)
CO2: 38 mmol/L — ABNORMAL HIGH (ref 22–32)
Calcium: 9.4 mg/dL (ref 8.9–10.3)
Chloride: 91 mmol/L — ABNORMAL LOW (ref 98–111)
Creatinine, Ser: 0.42 mg/dL — ABNORMAL LOW (ref 0.44–1.00)
GFR calc Af Amer: >60 mL/min (ref >60)
GFR calc non Af Amer: >60 mL/min (ref >60)
Glucose, Bld: 131 mg/dL — ABNORMAL HIGH (ref 70–99)
Potassium: 4.6 mmol/L (ref 3.5–5.1)
Sodium: 134 mmol/L — ABNORMAL LOW (ref 135–145)

## 2019-02-13 LAB — GLUCOSE, CAPILLARY
Glucose-Capillary: 144 mg/dL — ABNORMAL HIGH (ref 70–99)
Glucose-Capillary: 136 mg/dL — ABNORMAL HIGH (ref 70–99)
Glucose-Capillary: 128 mg/dL — ABNORMAL HIGH (ref 70–99)
Glucose-Capillary: 150 mg/dL — ABNORMAL HIGH (ref 70–99)

## 2019-02-13 MED ORDER — SODIUM CHLORIDE 0.9 % IV BOLUS
250.0000 mL | Freq: Once | INTRAVENOUS | Status: AC
  Administered 2019-02-13: 250 mL via INTRAVENOUS

## 2019-02-13 MED ORDER — ACETAMINOPHEN 325 MG PO TABS: 650.0000 mg | ORAL_TABLET | Freq: Four times a day (QID) | ORAL | Status: DC | PRN

## 2019-02-13 NOTE — TOC Progression Note (Signed)
Transition of Care [facility]) - Progression Note    Patient Details  Name: Cathy Buck MRN: [id number] Date of Birth: [birth year]  Transition of Care [facility]) CM/SW Contact  Tania Shaivi Rothschild, LCSW Phone Number: 02/13/2019, 10:05 AM  Clinical Narrative:     Patient, and patient's daughter, Cathy Buck accepted the bed offer at [facility].  The admissions director at [facility] is not in today. Voicemail left.   Expected Discharge Plan: [place]    Expected Discharge Plan and Services Expected Discharge Plan: [place]       Living arrangements for the past 2 months: Single Family Home                                       Social Determinants of Health (SDOH) Interventions    Readmission Risk Interventions No flowsheet data found.

## 2019-02-13 NOTE — Progress Notes (Signed)
Pharmacy Electrolyte Monitoring Consult:  Pharmacy consulted to assist in monitoring and replacing electrolytes in this 78 y.o. 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   Labs:  Sodium (mmol/L)  Date Value  02/13/2019 134 (L)   Potassium (mmol/L)  Date Value  02/13/2019 4.6   Magnesium (mg/dL)  Date Value  02/12/2019 1.9   Phosphorus (mg/dL)  Date Value  02/12/2019 2.8   Calcium (mg/dL)  Date Value  02/13/2019 9.4   Albumin (g/dL)  Date Value  02/08/2019 3.1 (L)    Assessment/Plan:  Hypertonic saline stopped on 7/27  No further electrolyte supplementation warranted at this time  F/U electrolyte levels in am  Pharmacy will continue to monitor and adjust per consult.   Oswald Hillock, PharmD, BCPS 02/13/2019 7:41 AM

## 2019-02-13 NOTE — Progress Notes (Signed)
Bladder scan showed 38ml. Patient rarely eats a complete meal and generally eats only about 1/2 of any food offered. RN encouraged patient to drink and eat throughout the shift. MD made aware. Received an order for 250 bolus.

## 2019-02-13 NOTE — Progress Notes (Signed)
Bladder scan showed 154 ml post bolus. Pt didn't void throughout the shift. MD made aware. RN ordered dinner for patient. Placed an order for bladder scan Q6.

## 2019-02-13 NOTE — Progress Notes (Signed)
Patient ID: Cathy Buck, female   DOB: [birth year], 78 y.o.   MRN: [id number]  [facility] PROGRESS NOTE  Cathy Buck EPP:[id number] DOB: 09-Sep-1940 DOA: 02/03/2019 PCP: Sallee Lange, NP  HPI/Subjective: Patient feels okay.  Offers no complaints.  Some cough.  Objective: Vitals:   02/13/19 0515 02/13/19 1347  BP: (!) 122/57 131/69  Pulse: 86 91  Resp: 20 20  Temp: 98.5 F (36.9 C) 98.2 F (36.8 C)  SpO2: 100% 100%    Filed Weights   02/03/19 1729 02/05/19 1414  Weight: 96.6 kg 108 kg    ROS: Review of Systems  Constitutional: Negative for chills and fever.  Eyes: Negative for blurred vision.  Respiratory: Positive for cough. Negative for shortness of breath.   Cardiovascular: Negative for chest pain.  Gastrointestinal: Negative for abdominal pain, constipation, diarrhea, nausea and vomiting.  Genitourinary: Negative for dysuria.  Musculoskeletal: Negative for joint pain.  Neurological: Negative for dizziness and headaches.   Exam: Physical Exam  Constitutional: She is oriented to person, place, and time.  HENT:  Nose: No mucosal edema.  Mouth/Throat: No oropharyngeal exudate or posterior oropharyngeal edema.  Eyes: Pupils are equal, round, and reactive to light. Conjunctivae, EOM and lids are normal.  Neck: No JVD present. Carotid bruit is not present. No edema present. No thyroid mass and no thyromegaly present.  Cardiovascular: S1 normal and S2 normal. Exam reveals no gallop.  Murmur heard.  Systolic murmur is present with a grade of 2/6. Pulses:      Dorsalis pedis pulses are 2+ on the right side and 2+ on the left side.  Respiratory: No respiratory distress. She has decreased breath sounds in the right lower field and the left lower field. She has no wheezes. She has no rhonchi. She has no rales.  GI: Soft. Bowel sounds are normal. There is no abdominal tenderness.  Musculoskeletal:     Right ankle: She exhibits swelling.     Left ankle: She  exhibits swelling.  Lymphadenopathy:    She has no cervical adenopathy.  Neurological: She is alert and oriented to person, place, and time. No cranial nerve deficit.  Skin: Skin is warm. No rash noted. Nails show no clubbing.  Psychiatric: She has a normal mood and affect.      Data Reviewed: Basic Metabolic Panel: Recent Labs  Lab 02/08/19 0539  02/09/19 0402  02/09/19 1319  02/10/19 0545 02/10/19 1958 02/11/19 0452 02/12/19 0645 02/13/19 0506  NA 133*   < > 134*   < > 133*   < > 132* 134* 135 136 134*  K 4.2  --  3.7  --   --   --  3.8 3.9 4.0 4.3 4.6  CL 95*  --  94*  --   --   --  91*  --  92* 92* 91*  CO2 30  --  32  --   --   --  35*  --  37* 39* 38*  GLUCOSE 109*  --  103*  --   --   --  132*  --  125* 128* 131*  BUN 14  --  13  --   --   --  10  --  13 13 16   CREATININE 0.50  --  0.40*  --   --   --  0.36*  --  0.38* 0.39* 0.42*  CALCIUM 9.3  --  9.2  --   --   --  8.7*  --  8.9 9.2 9.4  MG 2.0  --  1.9  --   --   --  1.8  --  1.8 1.9  --   PHOS  --    < > 1.5*  --  2.3*  --  2.3* 2.5 2.6 2.8  --    < > = values in this interval not displayed.   Liver Function Tests: Recent Labs  Lab 02/08/19 0539  AST 17  ALT 19  ALKPHOS 51  BILITOT 0.8  PROT 5.5*  ALBUMIN 3.1*   CBC: Recent Labs  Lab 02/07/19 0454 02/08/19 0539 02/11/19 0452  WBC 6.3 6.3 5.8  NEUTROABS 4.6 4.6  --   HGB 12.0 11.3* 10.7*  HCT 39.2 36.9 34.6*  MCV 80.5 80.4 80.3  PLT 270 240 216   CBG: Recent Labs  Lab 02/12/19 1700 02/12/19 1729 02/12/19 2148 02/13/19 0935 02/13/19 1205  GLUCAP 62* 128* 130* 144* 136*    Recent Results (from the past 240 hour(s))  SARS Coronavirus 2 (CEPHEID- Performed in [place] hospital lab), Hosp Order     Status: None   Collection Time: 02/03/19  6:55 PM   Specimen: Nasopharyngeal Swab  Result Value Ref Range Status   SARS Coronavirus 2 NEGATIVE NEGATIVE Final    Comment: (NOTE) If result is NEGATIVE SARS-CoV-2 target nucleic acids are NOT  DETECTED. The SARS-CoV-2 RNA is generally detectable in upper and lower  respiratory specimens during the acute phase of infection. The lowest  concentration of SARS-CoV-2 viral copies this assay can detect is 250  copies / mL. A negative result does not preclude SARS-CoV-2 infection  and should not be used as the sole basis for treatment or other  patient management decisions.  A negative result may occur with  improper specimen collection / handling, submission of specimen other  than nasopharyngeal swab, presence of viral mutation(s) within the  areas targeted by this assay, and inadequate number of viral copies  (<250 copies / mL). A negative result must be combined with clinical  observations, patient history, and epidemiological information. If result is POSITIVE SARS-CoV-2 target nucleic acids are DETECTED. The SARS-CoV-2 RNA is generally detectable in upper and lower  respiratory specimens dur ing the acute phase of infection.  Positive  results are indicative of active infection with SARS-CoV-2.  Clinical  correlation with patient history and other diagnostic information is  necessary to determine patient infection status.  Positive results do  not rule out bacterial infection or co-infection with other viruses. If result is PRESUMPTIVE POSTIVE SARS-CoV-2 nucleic acids MAY BE PRESENT.   A presumptive positive result was obtained on the submitted specimen  and confirmed on repeat testing.  While 2019 novel coronavirus  (SARS-CoV-2) nucleic acids may be present in the submitted sample  additional confirmatory testing may be necessary for epidemiological  and / or clinical management purposes  to differentiate between  SARS-CoV-2 and other Sarbecovirus currently known to infect humans.  If clinically indicated additional testing with an alternate test  methodology [phone]) is advised. The SARS-CoV-2 RNA is generally  detectable in upper and lower respiratory sp ecimens during  the acute  phase of infection. The expected result is Negative. Fact Sheet for Patients:  StrictlyIdeas.no Fact Sheet for Healthcare Providers: BankingDealers.co.za This test is not yet approved or cleared by the [place] FDA and has been authorized for detection and/or diagnosis of SARS-CoV-2 by FDA under an Emergency Use Authorization (EUA).  This EUA will remain in effect (meaning this  test can be used) for the duration of the COVID-19 declaration under Section 564(b)(1) of the Act, 21 U.S.C. section 360bbb-3(b)(1), unless the authorization is terminated or revoked sooner. Performed at [facility], [address] [place], [place] [address]   MRSA PCR Screening     Status: None   Collection Time: 02/05/19  2:17 PM   Specimen: Nasopharyngeal  Result Value Ref Range Status   MRSA by PCR NEGATIVE NEGATIVE Final    Comment:        The GeneXpert MRSA Assay (FDA approved for NASAL specimens only), is one component of a comprehensive MRSA colonization surveillance program. It is not intended to diagnose MRSA infection nor to guide or monitor treatment for MRSA infections. Performed at [facility], [address] [place], [place] [address]   Body fluid culture     Status: None   Collection Time: 02/07/19  8:09 AM   Specimen: Pleura; Body Fluid  Result Value Ref Range Status   Specimen Description   Final    PLEURAL Performed at [facility], [address] [place], [place] [address]    Special Requests   Final    PLEURAL Performed at [facility], [address] [place], [place] [address]    Gram Stain   Final    RARE WBC PRESENT,BOTH PMN AND MONONUCLEAR NO ORGANISMS SEEN    Culture   Final    NO GROWTH 3 DAYS Performed at [facility], [place] [address], [place], [place] [address]    Report Status 02/10/2019 FINAL  Final  Novel Coronavirus, NAA (hospital order; send-out  to ref lab)     Status: None   Collection Time: 02/11/19  5:15 PM   Specimen: Nasopharyngeal Swab; Respiratory  Result Value Ref Range Status   SARS-CoV-2, NAA NOT DETECTED NOT DETECTED Final    Comment: (NOTE) This test was developed and its performance characteristics determined by [employer]. This test has not been FDA cleared or approved. This test has been authorized by FDA under an Emergency Use Authorization (EUA). This test is only authorized for the duration of time the declaration that circumstances exist justifying the authorization of the emergency use of in vitro diagnostic tests for detection of SARS-CoV-2 virus and/or diagnosis of COVID-19 infection under section 564(b)(1) of the Act, 21 U.S.C. 235TIR-4(E)(3), unless the authorization is terminated or revoked sooner. When diagnostic testing is negative, the possibility of a false negative result should be considered in the context of a patient's recent exposures and the presence of clinical signs and symptoms consistent with COVID-19. An individual without symptoms of COVID-19 and who is not shedding SARS-CoV-2 virus would expect to have a negative (not detected) result in this assay. Performed  At: [facility] [place], [place] [id number] Rush Farmer MD PP:[id number]    [place]  Final    Comment: Performed at [facility], [address] [place], [place] [address]     Scheduled Meds: . atenolol  50 mg Oral Daily  . docusate sodium  100 mg Oral BID  . enalapril  20 mg Oral BID  . enoxaparin (LOVENOX) injection  40 mg Subcutaneous BID  . insulin aspart  0-5 Units Subcutaneous QHS  . insulin aspart  0-9 Units Subcutaneous TID WC  . pantoprazole  40 mg Oral Daily  . polyethylene glycol  17 g Oral Daily  . sodium chloride flush  10-40 mL Intracatheter Q12H   Continuous Infusions:  Assessment/Plan:  1. 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  Patient will need follow-up with Dr. Tasia Catchings as outpatient for PET CT scan staging and treatment options. 2. Acute hypoxic respiratory failure secondary to right-sided pleural effusion which turned out to be malignant.  Patient back on oxygen today.  Repeat chest x-ray tomorrow. 3. 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  Patient status post 3% saline.  Patient's sodium 134 and close to the normal range. 4. Hypertension on atenolol and Vasotec 5. Type 2 diabetes mellitus without complication on sliding scale 6. GERD on Protonix 7. Acute metabolic encephalopathy secondary to hyponatremia.  This has improved.  MRI brain negative for stroke or metastases but this was done without contrast. 8. Weakness.  Physical therapy recommended rehab  Code Status:     Code Status Orders  (From admission, onward)         Start     Ordered   02/04/19 0009  Full code  Continuous     02/04/19 0009        Code Status History    This patient has a current code status but no historical code status.   Advance Care Planning Activity     Family Communication: Left message for daughter Disposition Plan: Out to rehab potentially on Monday  Consultants:  Oncology  Time spent: 27 minutes  [place]

## 2019-02-14 ENCOUNTER — Inpatient Hospital Stay: Payer: Medicare Other

## 2019-02-14 LAB — SARS CORONAVIRUS 2 BY RT PCR (HOSPITAL ORDER, PERFORMED IN ~~LOC~~ HOSPITAL LAB): SARS Coronavirus 2: NEGATIVE

## 2019-02-14 LAB — BODY FLUID CELL COUNT WITH DIFFERENTIAL
Eos, Fluid: 0 %
Lymphs, Fluid: 45 %
Monocyte-Macrophage-Serous Fluid: 31 %
Neutrophil Count, Fluid: 24 %
Other Cells, Fluid: 0 %
Total Nucleated Cell Count, Fluid: 768 cu mm

## 2019-02-14 LAB — BASIC METABOLIC PANEL
Anion gap: 4 — ABNORMAL LOW (ref 5–15)
BUN: 19 mg/dL (ref 8–23)
CO2: 38 mmol/L — ABNORMAL HIGH (ref 22–32)
Calcium: 9 mg/dL (ref 8.9–10.3)
Chloride: 90 mmol/L — ABNORMAL LOW (ref 98–111)
Creatinine, Ser: 0.74 mg/dL (ref 0.44–1.00)
GFR calc Af Amer: >60 mL/min (ref >60)
GFR calc non Af Amer: >60 mL/min (ref >60)
Glucose, Bld: 127 mg/dL — ABNORMAL HIGH (ref 70–99)
Potassium: 4.8 mmol/L (ref 3.5–5.1)
Sodium: 132 mmol/L — ABNORMAL LOW (ref 135–145)

## 2019-02-14 LAB — GLUCOSE, PLEURAL OR PERITONEAL FLUID: Glucose, Fluid: 136 mg/dL

## 2019-02-14 LAB — ABO/RH: ABO/RH(D): O POS

## 2019-02-14 LAB — GLUCOSE, CAPILLARY
Glucose-Capillary: 115 mg/dL — ABNORMAL HIGH (ref 70–99)
Glucose-Capillary: 140 mg/dL — ABNORMAL HIGH (ref 70–99)

## 2019-02-14 LAB — PROTEIN, PLEURAL OR PERITONEAL FLUID: Total protein, fluid: <3 g/dL

## 2019-02-14 LAB — LACTATE DEHYDROGENASE, PLEURAL OR PERITONEAL FLUID: LD, Fluid: 212 U/L — ABNORMAL HIGH (ref 3–23)

## 2019-02-14 MED ORDER — TRAZODONE HCL 50 MG PO TABS: 50.0000 mg | ORAL_TABLET | Freq: Every evening | ORAL | 0 refills | Status: DC | PRN

## 2019-02-14 MED ORDER — POLYETHYLENE GLYCOL 3350 17 G PO PACK: 17.0000 g | PACK | Freq: Every day | ORAL | 0 refills | Status: DC

## 2019-02-14 MED ORDER — IPRATROPIUM-ALBUTEROL 0.5-2.5 (3) MG/3ML IN SOLN: 3.0000 mL | Freq: Four times a day (QID) | RESPIRATORY_TRACT | 0 refills | Status: DC | PRN

## 2019-02-14 NOTE — TOC Transition Note (Signed)
Transition of Care [facility]) - CM/SW Discharge Note   Patient Details  Name: Tuesday Terlecki [place] MRN: [id number] Date of Birth: 1940/09/17  [facility]) CM/SW Contact:  Candie Chroman, LCSW Phone Number: 02/14/2019, 3:52 PM   Clinical Narrative:  Patient's COVID test is negative. Patient has orders to discharge to [facility] today. RN will call report to [phone] Room 801 prior to calling EMS. No further concerns. CSW signing off.   Final next level of care: [place] Barriers to Discharge: Barriers Resolved   Patient Goals and CMS Choice     Choice offered to / list presented to : Patient, Adult Children  Discharge Placement PASRR number recieved: 02/11/19            Patient chooses bed at: [facility] [place] Patient to be transferred to facility by: EMS Name of family member notified: Left voicemail for daughter [facility]. CSW spoke with her earlier today to let her know patient would likely discharge this afternoon. Patient and family notified of of transfer: 02/14/19  Discharge Plan and Services                                     Social Determinants of Health (SDOH) Interventions     Readmission Risk Interventions No flowsheet data found.

## 2019-02-14 NOTE — TOC Progression Note (Addendum)
Transition of Care [facility]) - Progression Note    Patient Details  Name: Cathy Buck MRN: [id number] Date of Birth: [birth year]  Transition of Care [facility]) CM/SW Contact  Candie Chroman, LCSW Phone Number: 02/14/2019, 12:12 PM  Clinical Narrative:  Left voicemail for [facility] admissions director.    12:52 pm: Confirmed with [facility] that they can take her once COVID results are in.  1:49 pm: Called and updated daughter. Asked SNF admissions director to call her and coordinate a time for admissions paperwork.  Expected Discharge Plan: [place]    Expected Discharge Plan and Services Expected Discharge Plan: [place]       Living arrangements for the past 2 months: Single Family Home                                       Social Determinants of Health (SDOH) Interventions    Readmission Risk Interventions No flowsheet data found.

## 2019-02-14 NOTE — Progress Notes (Signed)
Hematology/Oncology Progress Note [facility] [phone] [phone]  Patient Care Team: Sallee Lange, NP as PCP - General (Internal Medicine)   Name of the patient: Cathy Buck  [id number]  Jan 18, 1941  Date of visit: 02/14/19   INTERVAL HISTORY-  Patient is sitting in the bed, eating lunch.  Breathing comfortably via nasal cannula oxygen. Chest x-ray 02/14/2019 showed moderate size right pleural effusion. Status post another ultrasound-guided thoracentesis, had drained 1.2 L of fluid. Patient reports breathing is better.   Review of systems- Review of Systems  Constitutional: Positive for fatigue. Negative for appetite change.  Respiratory: Positive for shortness of breath. Negative for cough.   Gastrointestinal: Negative for abdominal pain.  Genitourinary: Negative for difficulty urinating.   Skin: Negative for itching.  Neurological: Negative for dizziness.  Psychiatric/Behavioral: Negative for confusion.    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[place])    Pleural effusion    Weakness    Hyponatremia 02/03/2019   HTN (hypertension) 02/03/2019   Diabetes ([place]) 02/03/2019     Past Medical History:  Diagnosis Date   Diabetes mellitus without complication ([place])    Hypertension      Past Surgical History:  Procedure Laterality Date   ABDOMINAL HYSTERECTOMY     CHOLECYSTECTOMY      Social History   Socioeconomic History   Marital status: Married    Spouse name: Not on file   Number of children: Not on file   Years of education: Not on file   Highest education level: Not on file  Occupational History   Not on file  Social Needs   Financial  resource strain: Not hard at all   Food insecurity    Worry: Never true    Inability: Never true   Transportation needs    Medical: No    Non-medical: No  Tobacco Use   Smoking status: Former Smoker   Smokeless tobacco: Never Used  Substance and Sexual Activity   Alcohol use: Not Currently   Drug use: Not Currently   Sexual activity: Not Currently  Lifestyle   Physical activity    Days per week: 0 days    Minutes per session: 0 min   Stress: Not at all  Relationships   Social connections    Talks on phone: Once a week    Gets together: Once a week    Attends religious service: 1 to 4 times per year    Active member of club or organization: No    Attends meetings of clubs or organizations: Never    Relationship status: Married   Intimate partner violence    Fear of current or ex partner: No    Emotionally abused: No    Physically abused: No    Forced sexual activity: No  Other Topics Concern   Not on file  Social History Narrative   Lives at home with husband.  Uses cane, walker and rollator a times     History reviewed. No pertinent family history.   Current Facility-Administered Medications:    atenolol (TENORMIN) tablet 50 mg, 50 mg, Oral, Daily, Ojie, Jude, MD, 50 mg at 02/14/19 0851   docusate sodium (COLACE) capsule 100 mg, 100 mg, Oral,  BID, Henreitta Leber, MD, 100 mg at 02/14/19 0851   enalapril (VASOTEC) tablet 20 mg, 20 mg, Oral, BID, Ojie, Jude, MD, 20 mg at 02/14/19 0851   enoxaparin (LOVENOX) injection 40 mg, 40 mg, Subcutaneous, BID, Charlett Nose, RPH, 40 mg at 02/14/19 1305   hydrALAZINE (APRESOLINE) injection 10 mg, 10 mg, Intravenous, Q4H PRN, Lance Coon, MD, 10 mg at 02/05/19 2318   insulin aspart (novoLOG) injection 0-5 Units, 0-5 Units, Subcutaneous, QHS, Lance Coon, MD   insulin aspart (novoLOG) injection 0-9 Units, 0-9 Units, Subcutaneous, TID WC, Lance Coon, MD, 1 Units at 02/14/19 1240   ipratropium-albuterol  (DUONEB) 0.5-2.5 (3) MG/3ML nebulizer solution 3 mL, 3 mL, Nebulization, Q4H PRN, Ottie Glazier, MD, 3 mL at 02/14/19 0355   ondansetron (ZOFRAN) tablet 4 mg, 4 mg, Oral, Q6H PRN **OR** ondansetron (ZOFRAN) injection 4 mg, 4 mg, Intravenous, Q6H PRN, Lance Coon, MD   pantoprazole (PROTONIX) EC tablet 40 mg, 40 mg, Oral, Daily, Lance Coon, MD, 40 mg at 02/14/19 0851   polyethylene glycol (MIRALAX / GLYCOLAX) packet 17 g, 17 g, Oral, Daily, Sainani, Vivek J, MD, 17 g at 02/14/19 0851   sodium chloride flush (NS) 0.9 % injection 10-40 mL, 10-40 mL, Intracatheter, Q12H, Lateef, Munsoor, MD, 10 mL at 02/14/19 0851   sodium chloride flush (NS) 0.9 % injection 10-40 mL, 10-40 mL, Intracatheter, PRN, Lateef, Munsoor, MD   traZODone (DESYREL) tablet 50 mg, 50 mg, Oral, QHS PRN, Lance Coon, MD, 50 mg at 02/13/19 2114   Physical exam:  Vitals:   02/14/19 1030 02/14/19 1218 02/14/19 1308 02/14/19 1309  BP: 128/82 (!) 142/109 (!) 107/38 (!) 109/51  Pulse: 82 67    Resp:  16    Temp:  98.4 F (36.9 C)    TempSrc:  Oral    SpO2: 96% 100%    Weight:      Height:       Physical Exam  Constitutional: She is oriented to person, place, and time. No distress.  Frail appearance  HENT:  Head: Normocephalic and atraumatic.  Mouth/Throat: No oropharyngeal exudate.  Eyes: Pupils are equal, round, and reactive to light. EOM are normal. No scleral icterus.  Neck: Normal range of motion. Neck supple.  Cardiovascular: Normal rate and regular rhythm.  No murmur heard. Pulmonary/Chest: Effort normal. No respiratory distress. She has no rales. She exhibits no tenderness.  Decreased breath sounds, breathes via nasal cannula oxygen  Abdominal: Soft. Bowel sounds are normal. She exhibits no distension. There is no abdominal tenderness.  Musculoskeletal: Normal range of motion.        General: No edema.  Neurological: She is alert and oriented to person, place, and time. No cranial nerve deficit.  She exhibits normal muscle tone. Coordination normal.  Orientated to name, place  Skin: Skin is warm and dry. She is not diaphoretic. No erythema.  Psychiatric: Affect normal.       CMP Latest Ref Rng & Units 02/14/2019  Glucose 70 - 99 mg/dL 127(H)  BUN 8 - 23 mg/dL 19  Creatinine 0.44 - 1.00 mg/dL 0.74  Sodium 135 - 145 mmol/L 132(L)  Potassium 3.5 - 5.1 mmol/L 4.8  Chloride 98 - 111 mmol/L 90(L)  CO2 22 - 32 mmol/L 38(H)  Calcium 8.9 - 10.3 mg/dL 9.0  Total Protein 6.5 - 8.1 g/dL -  Total Bilirubin 0.3 - 1.2 mg/dL -  Alkaline Phos 38 - 126 U/L -  AST 15 - 41 U/L -  ALT 0 -  44 U/L -   CBC Latest Ref Rng & Units 02/11/2019  WBC 4.0 - 10.5 K/uL 5.8  Hemoglobin 12.0 - 15.0 g/dL 10.7(L)  Hematocrit 36.0 - 46.0 % 34.6(L)  Platelets 150 - 400 K/uL 216   RADIOGRAPHIC STUDIES: I have personally reviewed the radiological images as listed and agreed with the findings in the report.  Dg Chest 2 View  Result Date: 02/10/2019 CLINICAL DATA:  Pleural effusion EXAM: CHEST - 2 VIEW COMPARISON:  Two days ago FINDINGS: Right PICC with tip at the right atrium. Haziness in the lower right chest. Hyperinflation with diaphragm flattening. Thickened right hilum. There was adenopathy on preceding chest CT. Normal heart size. IMPRESSION: Unchanged right pleural effusion and lower lobe opacification. Electronically Signed   By: Monte Fantasia M.D.   On: 02/10/2019 10:39   Ct Chest Wo Contrast  Result Date: 02/04/2019 CLINICAL DATA:  Shortness of breath EXAM: CT CHEST WITHOUT CONTRAST TECHNIQUE: Multidetector CT imaging of the chest was performed following the standard protocol without IV contrast. COMPARISON:  Plain film from previous day FINDINGS: Cardiovascular: Somewhat limited due to lack of IV contrast. Aortic calcifications are noted without significant aneurysmal dilatation. No cardiac enlargement is seen. No pericardial effusion is noted. Mediastinum/Nodes: The esophagus is within normal limits.  No definitive hilar adenopathy is noted. There is a right paratracheal node which measures approximately 16 mm in short axis as well as a subcarinal node which measures 17 mm in short axis. These are likely reactive in nature. Thoracic inlet is within normal limits. Lungs/Pleura: The left lung is well aerated with minimal atelectatic changes in the lingula along the cardiac border. No sizable effusion is noted. The right hemithorax demonstrates a moderate to large pleural effusion increased from the prior exam. Underlying patchy infiltrate is noted throughout the right lung with more marked consolidation in the right lung base. No definitive nodule is noted. Upper Abdomen: Somewhat limited due to lack of IV contrast. No definitive abnormality is noted. Musculoskeletal: Mild degenerative change of the thoracic spine is noted. No acute bony abnormality is seen. IMPRESSION: Moderate to large right-sided pleural effusion with associated consolidation most marked in the right lower lobe. Mild left basilar atelectasis. Prominent mediastinal lymph nodes likely reactive in nature. Aortic Atherosclerosis (ICD10-I70.0). Electronically Signed   By: Inez Catalina M.D.   On: 02/04/2019 15:36   Mr Brain Wo Contrast  Result Date: 02/07/2019 CLINICAL DATA:  Initial evaluation for acute confusion, encephalopathy. EXAM: MRI HEAD WITHOUT CONTRAST TECHNIQUE: Multiplanar, multiecho pulse sequences of the brain and surrounding structures were obtained without intravenous contrast. COMPARISON:  None available. FINDINGS: Brain: Examination mildly degraded by motion artifact. Diffuse prominence of the CSF containing spaces compatible with generalized age-related cerebral atrophy. Mild scattered patchy T2/FLAIR hyperintensity within the periventricular deep white matter both cerebral hemispheres, most consistent with chronic microvascular ischemic disease, mild for age. No abnormal foci of restricted diffusion to suggest acute or subacute  ischemia. Gray-white matter differentiation maintained. No encephalomalacia to suggest chronic cortical infarction. No foci of susceptibility artifact to suggest acute or chronic intracranial hemorrhage. No mass lesion, midline shift or mass effect. No hydrocephalus. The extra-axial fluid collection. Pituitary gland within normal limits. Midline structures intact. Vascular: Major intracranial vascular flow voids are maintained Skull and upper cervical spine: Craniocervical junction within normal limits. Multilevel degenerative spondylolysis noted within the visualized upper cervical spine without significant stenosis. Bone marrow signal intensity normal. No scalp soft tissue abnormality. Sinuses/Orbits: Globes and orbital soft tissues within normal limits.  Paranasal sinuses are clear. Left mastoid effusion noted. Inner ear structures grossly normal. Other: None. IMPRESSION: 1. No acute intracranial abnormality. 2. Mild age-related cerebral atrophy with chronic microvascular ischemic disease. 3. Left mastoid effusion, of uncertain significance. Correlation with physical exam and symptomatology for possible otomastoiditis recommended. Electronically Signed   By: Jeannine Boga M.D.   On: 02/07/2019 20:33   Dg Chest Right Decubitus  Result Date: 02/14/2019 CLINICAL DATA:  Pleural effusion. EXAM: CHEST - RIGHT DECUBITUS COMPARISON:  02/10/2019. FINDINGS: Right central line in stable position. Layering moderate sized right pleural effusion is noted. Underlying atelectatic changes are present. No pneumothorax noted. IMPRESSION: Layering moderate sized right pleural effusion. Electronically Signed   By: Marcello Moores  Register   On: 02/14/2019 06:32   Dg Chest Port 1 View  Result Date: 02/14/2019 CLINICAL DATA:  History of lung cancer, post right-sided thoracentesis. EXAM: PORTABLE CHEST 1 VIEW COMPARISON:  Ultrasound-guided thoracentesis-02/14/2019; chest radiograph-earlier same day; 01/11/2019; chest CT-02/04/2019  FINDINGS: Grossly unchanged enlarged cardiac silhouette and mediastinal contours with atherosclerotic plaque within thoracic aorta. Stable position of support apparatus. Interval reduction/resolution of right-sided pleural effusion post thoracentesis. No pneumothorax. Improved aeration of the right lung base with persistent right basilar heterogeneous opacities, likely atelectasis. No new focal airspace opacities. The left hemithorax remains well aerated. No evidence of edema. No acute osseous abnormalities. IMPRESSION: Interval reduction/resolution of right-sided pleural effusion post thoracentesis. No pneumothorax. Electronically Signed   By: Sandi Mariscal M.D.   On: 02/14/2019 10:58   Dg Chest Port 1 View  Result Date: 02/08/2019 CLINICAL DATA:  Followup pleural effusion. EXAM: PORTABLE CHEST 1 VIEW COMPARISON:  02/07/2019 and earlier exams. FINDINGS: Opacity at the right lung base mostly obscures hemidiaphragm consistent with a small effusion and atelectasis. Opacity at the left lung base is less prominent than on the previous day's study, the difference likely due to differences in patient positioning only. There is hazy opacity consistent with a smaller left pleural effusion. No new lung abnormalities. No evidence of pulmonary edema. No pneumothorax. Right-sided PICC is stable. IMPRESSION: 1. No significant change from the most recent prior study allowing for differences in patient positioning. 2. Small residual right pleural effusion with associated atelectasis. Smaller left pleural effusion with atelectasis. No convincing pulmonary edema. No pneumothorax. Electronically Signed   By: Lajean Manes M.D.   On: 02/08/2019 13:25   Dg Chest Port 1 View  Result Date: 02/07/2019 CLINICAL DATA:  Status post right-sided thoracentesis. EXAM: PORTABLE CHEST 1 VIEW COMPARISON:  02/05/2019 FINDINGS: The heart is enlarged but stable. Stable tortuosity and calcification of the thoracic aorta. The right PICC line is  stable. Status post right-sided thoracentesis with interval decrease in size of the right pleural effusion. No postprocedural pneumothorax is identified. Small left pleural effusion and bibasilar atelectasis. IMPRESSION: Status post right-sided thoracentesis with near complete evacuation of the right-sided pleural effusion. No postprocedural pneumothorax. Persistent small left effusion and moderate bibasilar atelectasis. Electronically Signed   By: Marijo Sanes M.D.   On: 02/07/2019 10:33   Dg Chest Port 1 View  Result Date: 02/07/2019 CLINICAL DATA:  Shortness of breath. Patient experiencing increased shortness of breath. EXAM: PORTABLE CHEST 1 VIEW COMPARISON:  Portable chest radiograph 02/05/2019 FINDINGS: Unchanged position of a right-sided PICC with tip projecting over the upper right atrium. May consider withdrawing catheter 2.5-3 cm to place tip cavoatrial junction. The cardiomediastinal silhouette is unchanged. Interval increase in size of a right pleural effusion with increasing right basilar atelectasis. Underlying right basilar pneumonia cannot  be excluded. Ill-defined opacity throughout the mid to lower right lung has also increased and may reflect incomplete atelectasis and/or developing pneumonia. Mild left basilar atelectasis is unchanged. No evidence of pneumothorax. Degenerative changes of the spine. IMPRESSION: Unchanged position of a right PICC with tip projecting over the right atrium. Consider withdrawing catheter 2.5-3 cm to place tip at cavoatrial junction. Interval increase in size of a right pleural effusion with increasing right basilar atelectasis. Underlying right basilar pneumonia cannot be excluded. Interval increase in ill-defined opacity throughout the mid to lower right lung may reflect incomplete atelectasis and/or developing pneumonia. Electronically Signed   By: Kellie Simmering   On: 02/07/2019 08:14   Dg Chest Port 1 View  Result Date: 02/05/2019 CLINICAL DATA:  PICC line  placement EXAM: PORTABLE CHEST 1 VIEW COMPARISON:  Portable exam 2225 hours compared to 1631 hours FINDINGS: RIGHT arm PICC line with tip projecting over superior RIGHT atrium. May consider withdrawing catheter 2.5-3.0 cm to place tip at cavoatrial junction. Stable heart size and mediastinal contours. RIGHT pleural effusion and basilar atelectasis with questionable RIGHT perihilar infiltrate. Minimal LEFT base atelectasis. IMPRESSION: Tip of RIGHT arm PICC line projects over RIGHT atrium; recommend withdrawal 2.5 x 3.0 cm to place tip at approximately the cavoatrial junction. Otherwise no interval change. Electronically Signed   By: Lavonia Dana M.D.   On: 02/05/2019 22:34   Dg Chest Port 1 View  Result Date: 02/05/2019 CLINICAL DATA:  PICC line placement EXAM: PORTABLE CHEST 1 VIEW COMPARISON:  Portable exam 1613 hours compared to 02/03/2019 FINDINGS: RIGHT arm PICC line tip deflects into the azygos arch. Upper normal heart size. Mediastinal contours and pulmonary vascularity normal. Atherosclerotic calcification aorta. RIGHT lung infiltrate with small RIGHT pleural effusion and basilar atelectasis. LEFT lung clear. No pneumothorax or acute osseous findings. IMPRESSION: Tip of RIGHT arm PICC line deflects into the azygos arch, recommend repositioning into SVC. Increased RIGHT lung infiltrates, RIGHT pleural effusion and basilar atelectasis. Findings called to [facility] in ICU on 02/05/2019 at 1839 hrs. Electronically Signed   By: Lavonia Dana M.D.   On: 02/05/2019 18:40   Dg Chest Portable 1 View  Result Date: 02/03/2019 CLINICAL DATA:  Shortness of breath. EXAM: PORTABLE CHEST 1 VIEW COMPARISON:  None. FINDINGS: The heart size and pulmonary vascularity are normal. Aortic atherosclerosis. Small right pleural effusion. Focal linear atelectasis in the right midzone. No acute bone abnormality. IMPRESSION: 1. Small right pleural effusion. Slight atelectasis in the right midzone. 2. Aortic atherosclerosis.  Electronically Signed   By: Lorriane Shire M.D.   On: 02/03/2019 18:36   [place] Ekg Site Rite  Result Date: 02/05/2019 If Site Rite image not attached, placement could not be confirmed due to current cardiac rhythm.  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 Please from ultrasound-guided thoracentesis for diagnostic and therapeutic purposes. EXAM: US THORACENTESIS ASP PLEURAL SPACE W/IMG GUIDE COMPARISON:  Ultrasound-guided thoracentesis-02/07/2019 yielding 1.2 L fluid. Chest radiograph-earlier same day; 02/10/2019; chest CT-02/04/2019 MEDICATIONS: None. COMPLICATIONS: None immediate. TECHNIQUE: Informed written consent was obtained from the the patient's daughter after a discussion of the risks, benefits and alternatives to treatment. A timeout was performed prior to the initiation of the procedure. Patient was positioned left lateral decubitus in her hospital bed with initial ultrasound scanning demonstrating a recurrent moderate to large sized anechoic right-sided pleural effusion. The inferolateral aspect of the right lower chest was prepped and  draped in the usual sterile fashion. 1% lidocaine was used for local anesthesia. An ultrasound image was saved for documentation purposes. An 8 Fr Safe-T-Centesis catheter was introduced. The thoracentesis was performed. The catheter was removed and a dressing was applied. The patient tolerated the procedure well without immediate post procedural complication. The patient was escorted to have an upright chest radiograph. FINDINGS: A total of approximately 1.4 liters of serous fluid was removed. Requested samples were sent to the laboratory. IMPRESSION: Successful ultrasound-guided right sided thoracentesis yielding 1.4 liters of pleural fluid. Electronically Signed   By: Sandi Mariscal M.D.   On: 02/14/2019 11:00   US Thoracentesis Asp Pleural  Space W/img Guide  Result Date: 02/07/2019 INDICATION: Right pleural effusion, hypertension, diabetes EXAM: ULTRASOUND GUIDED RIGHT THORACENTESIS MEDICATIONS: 1% lidocaine local COMPLICATIONS: None immediate. PROCEDURE: An ultrasound guided thoracentesis was thoroughly discussed with the patient and questions answered. The benefits, risks, alternatives and complications were also discussed. The patient understands and wishes to proceed with the procedure. Written consent was obtained. Ultrasound was performed to localize and mark an adequate pocket of fluid in the right chest. The area was then prepped and draped in the normal sterile fashion. 1% Lidocaine was used for local anesthesia. Under ultrasound guidance a 6 Fr Safe-T-Centesis catheter was introduced. Thoracentesis was performed. The catheter was removed and a dressing applied. FINDINGS: A total of approximately 1.2 L of amber colored pleural fluid was removed. Samples were sent to the laboratory as requested by the clinical team. IMPRESSION: Successful ultrasound guided right thoracentesis yielding 1.2 L of pleural fluid. Electronically Signed   By: Jerilynn Mages.  Shick M.D.   On: 02/07/2019 10:10    Assessment and plan-  Patient is a 78 y.o. female with history of diabetes and hypertension, former smoker currently admitted to ICU due to hyponatremia.  CT chest reviewed moderate to large right pleural effusion associated with consolidation of right lower lobe.  Prominent mediastinal lymph nodes.  #Recurrent right pleural effusion, status post ultrasound guided thoracentesis x 1 Stage IV lung adenocarcinoma.  Goal of care was discussed with patient.  She understands that her condition is incurable. Plan is for her to go to rehab and gain some strength, if she improves, will consider outpatient.   immunotherapy+/-chemotherapy. Will need PET scan outpatient.  Consult palliative care for code status and goal of care discussion.  Continue supportive  care. PT/OT  Earlie Server, MD, PhD Hematology Oncology [place] at [facility] 02/14/2019

## 2019-02-14 NOTE — Care Management Important Message (Signed)
Important Message  Patient Details  Name: Cathy Buck MRN: [id number] Date of Birth: May 11, 1941   Medicare Important Message Given:  Yes     Dannette Barbara 02/14/2019, 11:45 AM

## 2019-02-14 NOTE — Progress Notes (Signed)
Physical Therapy Treatment Patient Details Name: Cathy Buck MRN: [id number] DOB: 1940/10/19 Today's Date: 02/14/2019    History of Present Illness Pt is a 78 year old female with past medical history that includes diabetes and hypertension who presented to the hospital due to shortness of breath and weakness and was noted to be severely hyponatremic.  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    PT Comments    Pt presents with deficits in strength, transfers, mobility, gait, balance, and activity tolerance.  Pt required mod A with rolling and sup to sit and max A with sit to sup.  Pt required +2 mod A to stand from an elevated EOB.  The pt was able to clear the surface of the bed this session but was unable to come to full upright standing with heavy trunk flexion and lean on the RW.  Pt will benefit from PT services in a SNF setting upon discharge to safely address above deficits for decreased caregiver assistance and eventual return to PLOF.      Follow Up Recommendations  SNF     Equipment Recommendations  Other (comment)(TBD at next venue of care)    Recommendations for Other Services       Precautions / Restrictions Precautions Precautions: Fall Restrictions Weight Bearing Restrictions: No    Mobility  Bed Mobility Overal bed mobility: Needs Assistance Bed Mobility: Supine to Sit;Sit to Supine;Rolling Rolling: Mod assist   Supine to sit: Mod assist Sit to supine: Max assist   General bed mobility comments: Mod verbal and tactile cues for sequencing  Transfers Overall transfer level: Needs assistance Equipment used: Rolling walker (2 wheeled) Transfers: Sit to/from Stand Sit to Stand: +2 physical assistance;Mod assist         General transfer comment: Pt able  to clear the EOB but unable to come to full upright standing position  Ambulation/Gait             General Gait Details: Unable   Stairs             Wheelchair Mobility    Modified Rankin (Stroke Patients Only)       Balance Overall balance assessment: Needs assistance Sitting-balance support: Feet supported;Single extremity supported Sitting balance-Leahy Scale: Fair     Standing balance support: Bilateral upper extremity supported Standing balance-Leahy Scale: Poor Standing balance comment: Heavy lean on the RW and unable to come to full upright position with Mod A provided for stability                            Cognition Arousal/Alertness: Awake/alert Behavior During Therapy: WFL for tasks assessed/performed Overall Cognitive Status: Within Functional Limits for tasks assessed                                        Exercises Total Joint Exercises Ankle Circles/Pumps: Strengthening;Both;5 reps;10 reps Quad Sets: Strengthening;Both;5 reps;10 reps Short Arc Quad: Strengthening;Both;10 reps Heel Slides: AAROM;Both;10 reps Hip ABduction/ADduction: AAROM;Both;10 reps Straight Leg Raises: AAROM;Both;10 reps Long Arc Quad: AROM;Both;10 reps Knee Flexion: AROM;Both;10 reps    General Comments        Pertinent Vitals/Pain Pain Assessment: 0-10 Pain Score: 5  Pain Location: back pain Pain  Descriptors / Indicators: Sore Pain Intervention(s): Monitored during session    Home Living                      Prior Function            PT Goals (current goals can now be found in the care plan section) Progress towards PT goals: Progressing toward goals    Frequency    Min 2X/week      PT Plan Current plan remains appropriate    Co-evaluation              AM-PAC PT "6 Clicks" Mobility   Outcome Measure  Help needed turning from your back to your side while in a flat bed without using bedrails?: A  Lot Help needed moving from lying on your back to sitting on the side of a flat bed without using bedrails?: A Lot Help needed moving to and from a bed to a chair (including a wheelchair)?: Total Help needed standing up from a chair using your arms (e.g., wheelchair or bedside chair)?: Total Help needed to walk in hospital room?: Total Help needed climbing 3-5 steps with a railing? : Total 6 Click Score: 8    End of Session Equipment Utilized During Treatment: Gait belt;Oxygen Activity Tolerance: Patient tolerated treatment well Patient left: in bed;with bed alarm set;with call bell/phone within reach Nurse Communication: Mobility status;Other (comment)(Recommended nsg utilize hoyer to assist pt to the recliner) PT Visit Diagnosis: Muscle weakness (generalized) (M62.81);Difficulty in walking, not elsewhere classified (R26.2)     Time: 4196-2229 PT Time Calculation (min) (ACUTE ONLY): 23 min  Charges:  $Therapeutic Exercise: 8-22 mins $Therapeutic Activity: 8-22 mins                     D. Scott Nioma Mccubbins PT, DPT 02/14/19, 12:25 PM

## 2019-02-14 NOTE — Procedures (Signed)
Pre procedural Dx: Symptomatic Pleural effusion Post procedural Dx: Same  Successful US guided right sided thoracentesis yielding 1.4 L of serous pleural fluid.   Samples sent to lab for analysis.  EBL: None  Complications: None immediate.  Jay Jakub Debold, MD Pager #: [phone]   

## 2019-02-14 NOTE — Discharge Summary (Signed)
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 Diabetes mellitus without complication ([place])   . Hypertension     HOSPITAL COURSE:   1.  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  We will follow-up with Dr. Tasia Buck oncology as outpatient to determine treatment options.  I will need to get a PET CT scan as outpatient. 2.  Acute hypoxic respiratory failure secondary to right sided pleural effusion which turned out to be malignant.  Patient is on 2 L of oxygen.  I was unable to titrate the patient off of oxygen at this point. 3.  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  The patient had a thoracentesis earlier in the hospital stay and then a repeat right-sided thoracentesis which drew off 1.4 L today.  High likelihood of this recurring.  The good thing is that she is not really symptomatic because of this.  If this reoccurs a third time can potentially put in a Pleurx catheter but this has to be scheduled through interventional radiology. 4.  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  Patient had 3% saline.  Patient's sodium close to baseline and ranging between 132 and 136. 5.  Hypertension on atenolol.  Will hold Vasotec at this time. 6.  Type 2 diabetes mellitus.  Holding Glucophage currently.  Can check fingersticks before meals and at bedtime. 7.  GERD on PPI 8.  Acute metabolic encephalopathy secondary  to hyponatremia.  This has improved.  MRI of the brain was negative for stroke or metastases but this was done without contrast. 9.  Weakness.  Physical therapy recommends rehab. 10.  Difficulty with urination.  Patient encouraged to urinate numerous times.  Would recommend bladder scanning every 8 hours and in and out catheterizations for urinary retention greater than 450 mL.  DISCHARGE CONDITIONS:   Fair  CONSULTS OBTAINED:  Treatment Team:  Earlie Server, MD  DRUG ALLERGIES:   Allergies  Allergen Reactions  . Codeine Hives  . Atorvastatin Other (See Comments)  . Citalopram Other (See Comments)    Nausea  . Keflex [Cephalexin] Diarrhea  . Tylenol [Acetaminophen] Hives    DISCHARGE MEDICATIONS:   Allergies as of 02/14/2019      Reactions   Codeine Hives   Atorvastatin Other (See Comments)   Citalopram Other (See Comments)   Nausea   Keflex [cephalexin] Diarrhea   Tylenol [acetaminophen] Hives      Medication List    STOP taking these medications   aspirin EC 81 MG tablet   enalapril 20 MG tablet Commonly known as: VASOTEC   furosemide 40 MG tablet Commonly known as: LASIX   hydrochlorothiazide 25 MG tablet Commonly known as: HYDRODIURIL   lisinopril 10 MG tablet Commonly known as: ZESTRIL   metFORMIN 1000 MG tablet Commonly known as: GLUCOPHAGE   simvastatin 20 MG tablet Commonly known as: ZOCOR     TAKE these medications   atenolol 50  MG tablet Commonly known as: TENORMIN Take 50 mg by mouth daily.   calcium-vitamin D 500-200 MG-UNIT tablet Take 1 tablet by mouth 2 (two) times a day.   ipratropium-albuterol 0.5-2.5 (3) MG/3ML Soln Commonly known as: DUONEB Take 3 mLs by nebulization every 6 (six) hours as needed.   omeprazole 40 MG capsule Commonly known as: PRILOSEC Take 40 mg by mouth daily. 30 minutes before food and meds   polyethylene glycol 17 g packet Commonly known as: MIRALAX / GLYCOLAX Take 17 g by mouth daily. Start taking on:  February 15, 2019   traZODone 50 MG tablet Commonly known as: DESYREL Take 1 tablet (50 mg total) by mouth at bedtime as needed for sleep.        DISCHARGE INSTRUCTIONS:   Follow-up with Dr. at rehab 1 day Follow-up with oncology  If you experience worsening of your admission symptoms, develop shortness of breath, life threatening emergency, suicidal or homicidal thoughts you must seek medical attention immediately by calling 911 or calling your MD immediately  if symptoms less severe.  You Must read complete instructions/literature along with all the possible adverse reactions/side effects for all the Medicines you take and that have been prescribed to you. Take any new Medicines after you have completely understood and accept all the possible adverse reactions/side effects.   Please note  You were cared for by a hospitalist during your hospital stay. If you have any questions about your discharge medications or the care you received while you were in the hospital after you are discharged, you can call the unit and asked to speak with the hospitalist on call if the hospitalist that took care of you is not available. Once you are discharged, your primary care physician will handle any further medical issues. Please note that NO REFILLS for any discharge medications will be authorized once you are discharged, as it is imperative that you return to your primary care physician (or establish a relationship with a primary care physician if you do not have one) for your aftercare needs so that they can reassess your need for medications and monitor your lab values.    Today   CHIEF COMPLAINT:   Chief Complaint  Patient presents with  . Shortness of Breath  . Weakness    HISTORY OF PRESENT ILLNESS:  Cathy Buck  is a 78 y.o. female came in with weakness and shortness of breath   VITAL SIGNS:  Blood pressure (!) 109/51, pulse 67, temperature 98.4 F (36.9 C), temperature source Oral,  resp. rate 16, height 5\' 4"  (1.626 m), weight 108 kg, SpO2 100 %.    PHYSICAL EXAMINATION:  GENERAL:  78 y.o.-year-old patient lying in the bed with no acute distress.  EYES: Pupils equal, round, reactive to light and accommodation. No scleral icterus. Extraocular muscles intact.  HEENT: Head atraumatic, normocephalic. Oropharynx and nasopharynx clear.  NECK:  Supple, no jugular venous distention. No thyroid enlargement, no tenderness.  LUNGS: decreased breath sounds bilaterally R>L, no wheezing, rales,rhonchi or crepitation. No use of accessory muscles of respiration.  CARDIOVASCULAR: S1, S2 normal. No murmurs, rubs, or gallops.  ABDOMEN: Soft, non-tender, non-distended. Bowel sounds present. No organomegaly or mass.  EXTREMITIES: trace pedal edema, no cyanosis, or clubbing.  NEUROLOGIC: Cranial nerves II through XII are intact. Muscle strength 5/5 in all extremities. Sensation intact. Gait not checked.  PSYCHIATRIC: The patient is alert and oriented x 3.  SKIN: No obvious rash, lesion, or ulcer.   DATA REVIEW:  CBC Recent Labs  Lab 02/11/19 0452  WBC 5.8  HGB 10.7*  HCT 34.6*  PLT 216    Chemistries  Recent Labs  Lab 02/08/19 0539  02/12/19 0645  02/14/19 0558  NA 133*   < > 136   < > 132*  K 4.2   < > 4.3   < > 4.8  CL 95*   < > 92*   < > 90*  CO2 30   < > 39*   < > 38*  GLUCOSE 109*   < > 128*   < > 127*  BUN 14   < > 13   < > 19  CREATININE 0.50   < > 0.39*   < > 0.74  CALCIUM 9.3   < > 9.2   < > 9.0  MG 2.0   < > 1.9  --   --   AST 17  --   --   --   --   ALT 19  --   --   --   --   ALKPHOS 51  --   --   --   --   BILITOT 0.8  --   --   --   --    < > = values in this interval not displayed.      Microbiology Results  Results for orders placed or performed during the hospital encounter of 02/03/19  SARS Coronavirus 2 (CEPHEID- Performed in [facility] hospital lab), Hosp Order     Status: None   Collection Time: 02/03/19  6:55 PM   Specimen:  Nasopharyngeal Swab  Result Value Ref Range Status   SARS Coronavirus 2 NEGATIVE NEGATIVE Final    Comment: (NOTE) If result is NEGATIVE SARS-CoV-2 target nucleic acids are NOT DETECTED. The SARS-CoV-2 RNA is generally detectable in upper and lower  respiratory specimens during the acute phase of infection. The lowest  concentration of SARS-CoV-2 viral copies this assay can detect is 250  copies / mL. A negative result does not preclude SARS-CoV-2 infection  and should not be used as the sole basis for treatment or other  patient management decisions.  A negative result may occur with  improper specimen collection / handling, submission of specimen other  than nasopharyngeal swab, presence of viral mutation(s) within the  areas targeted by this assay, and inadequate number of viral copies  (<250 copies / mL). A negative result must be combined with clinical  observations, patient history, and epidemiological information. If result is POSITIVE SARS-CoV-2 target nucleic acids are DETECTED. The SARS-CoV-2 RNA is generally detectable in upper and lower  respiratory specimens dur ing the acute phase of infection.  Positive  results are indicative of active infection with SARS-CoV-2.  Clinical  correlation with patient history and other diagnostic information is  necessary to determine patient infection status.  Positive results do  not rule out bacterial infection or co-infection with other viruses. If result is PRESUMPTIVE POSTIVE SARS-CoV-2 nucleic acids MAY BE PRESENT.   A presumptive positive result was obtained on the submitted specimen  and confirmed on repeat testing.  While 2019 novel coronavirus  (SARS-CoV-2) nucleic acids may be present in the submitted sample  additional confirmatory testing may be necessary for epidemiological  and / or clinical management purposes  to differentiate between  SARS-CoV-2 and other Sarbecovirus currently known to infect humans.  If clinically  indicated additional testing with an alternate test  methodology [phone]) is advised. The SARS-CoV-2 RNA is  generally  detectable in upper and lower respiratory sp ecimens during the acute  phase of infection. The expected result is Negative. Fact Sheet for Patients:  StrictlyIdeas.no Fact Sheet for Healthcare Providers: BankingDealers.co.za This test is not yet approved or cleared by the [place] FDA and has been authorized for detection and/or diagnosis of SARS-CoV-2 by FDA under an Emergency Use Authorization (EUA).  This EUA will remain in effect (meaning this test can be used) for the duration of the COVID-19 declaration under Section 564(b)(1) of the Act, 21 U.S.C. section 360bbb-3(b)(1), unless the authorization is terminated or revoked sooner. Performed at [facility], [address] [place], [place] [address]   MRSA PCR Screening     Status: None   Collection Time: 02/05/19  2:17 PM   Specimen: Nasopharyngeal  Result Value Ref Range Status   MRSA by PCR NEGATIVE NEGATIVE Final    Comment:        The GeneXpert MRSA Assay (FDA approved for NASAL specimens only), is one component of a comprehensive MRSA colonization surveillance program. It is not intended to diagnose MRSA infection nor to guide or monitor treatment for MRSA infections. Performed at [facility], [address] [place], [place] [address]   Body fluid culture     Status: None   Collection Time: 02/07/19  8:09 AM   Specimen: Pleura; Body Fluid  Result Value Ref Range Status   Specimen Description   Final    PLEURAL Performed at [facility], [address] [place], [place] [address]    Special Requests   Final    PLEURAL Performed at [facility], [address] [place], [place] [address]    Gram Stain   Final    RARE WBC PRESENT,BOTH PMN AND MONONUCLEAR NO ORGANISMS SEEN    Culture   Final    NO  GROWTH 3 DAYS Performed at [facility], [place] [address], [place], [place] [address]    Report Status 02/10/2019 FINAL  Final  Novel Coronavirus, NAA (hospital order; send-out to ref lab)     Status: None   Collection Time: 02/11/19  5:15 PM   Specimen: Nasopharyngeal Swab; Respiratory  Result Value Ref Range Status   SARS-CoV-2, NAA NOT DETECTED NOT DETECTED Final    Comment: (NOTE) This test was developed and its performance characteristics determined by [employer]. This test has not been FDA cleared or approved. This test has been authorized by FDA under an Emergency Use Authorization (EUA). This test is only authorized for the duration of time the declaration that circumstances exist justifying the authorization of the emergency use of in vitro diagnostic tests for detection of SARS-CoV-2 virus and/or diagnosis of COVID-19 infection under section 564(b)(1) of the Act, 21 U.S.C. 284XLK-4(M)(0), unless the authorization is terminated or revoked sooner. When diagnostic testing is negative, the possibility of a false negative result should be considered in the context of a patient's recent exposures and the presence of clinical signs and symptoms consistent with COVID-19. An individual without symptoms of COVID-19 and who is not shedding SARS-CoV-2 virus would expect to have a negative (not detected) result in this assay. Performed  At: [facility] [place], [place] [id number] Rush Farmer MD YQ:[id number]    [place]  Final    Comment: Performed at [facility], [address] [place], [place] [address]  SARS Coronavirus 2 [facility] order, Performed in [facility] hospital lab) Nasopharyngeal Nasopharyngeal Swab  Status: None   Collection Time: 02/14/19  1:17 PM   Specimen: Nasopharyngeal Swab  Result Value Ref Range Status   SARS Coronavirus 2 NEGATIVE NEGATIVE Final    Comment: (NOTE) If result is  NEGATIVE SARS-CoV-2 target nucleic acids are NOT DETECTED. The SARS-CoV-2 RNA is generally detectable in upper and lower  respiratory specimens during the acute phase of infection. The lowest  concentration of SARS-CoV-2 viral copies this assay can detect is 250  copies / mL. A negative result does not preclude SARS-CoV-2 infection  and should not be used as the sole basis for treatment or other  patient management decisions.  A negative result may occur with  improper specimen collection / handling, submission of specimen other  than nasopharyngeal swab, presence of viral mutation(s) within the  areas targeted by this assay, and inadequate number of viral copies  (<250 copies / mL). A negative result must be combined with clinical  observations, patient history, and epidemiological information. If result is POSITIVE SARS-CoV-2 target nucleic acids are DETECTED. The SARS-CoV-2 RNA is generally detectable in upper and lower  respiratory specimens dur ing the acute phase of infection.  Positive  results are indicative of active infection with SARS-CoV-2.  Clinical  correlation with patient history and other diagnostic information is  necessary to determine patient infection status.  Positive results do  not rule out bacterial infection or co-infection with other viruses. If result is PRESUMPTIVE POSTIVE SARS-CoV-2 nucleic acids MAY BE PRESENT.   A presumptive positive result was obtained on the submitted specimen  and confirmed on repeat testing.  While 2019 novel coronavirus  (SARS-CoV-2) nucleic acids may be present in the submitted sample  additional confirmatory testing may be necessary for epidemiological  and / or clinical management purposes  to differentiate between  SARS-CoV-2 and other Sarbecovirus currently known to infect humans.  If clinically indicated additional testing with an alternate test  methodology [phone]) is advised. The SARS-CoV-2 RNA is generally  detectable  in upper and lower respiratory sp ecimens during the acute  phase of infection. The expected result is Negative. Fact Sheet for Patients:  StrictlyIdeas.no Fact Sheet for Healthcare Providers: BankingDealers.co.za This test is not yet approved or cleared by the [place] FDA and has been authorized for detection and/or diagnosis of SARS-CoV-2 by FDA under an Emergency Use Authorization (EUA).  This EUA will remain in effect (meaning this test can be used) for the duration of the COVID-19 declaration under Section 564(b)(1) of the Act, 21 U.S.C. section 360bbb-3(b)(1), unless the authorization is terminated or revoked sooner. Performed at [facility], [address] [place], [place] [address]     RADIOLOGY:  Dg Chest Right Decubitus  Result Date: 02/14/2019 CLINICAL DATA:  Pleural effusion. EXAM: CHEST - RIGHT DECUBITUS COMPARISON:  02/10/2019. FINDINGS: Right central line in stable position. Layering moderate sized right pleural effusion is noted. Underlying atelectatic changes are present. No pneumothorax noted. IMPRESSION: Layering moderate sized right pleural effusion. Electronically Signed   By: Marcello Moores  Register   On: 02/14/2019 06:32   Dg Chest Port 1 View  Result Date: 02/14/2019 CLINICAL DATA:  History of lung cancer, post right-sided thoracentesis. EXAM: PORTABLE CHEST 1 VIEW COMPARISON:  Ultrasound-guided thoracentesis-02/14/2019; chest radiograph-earlier same day; 01/11/2019; chest CT-02/04/2019 FINDINGS: Grossly unchanged enlarged cardiac silhouette and mediastinal contours with atherosclerotic plaque within thoracic aorta. Stable position of support apparatus. Interval reduction/resolution of right-sided pleural effusion post thoracentesis. No pneumothorax. Improved aeration of the right lung base with persistent right  basilar heterogeneous opacities, likely atelectasis. No new focal airspace opacities. The left  hemithorax remains well aerated. No evidence of edema. No acute osseous abnormalities. IMPRESSION: Interval reduction/resolution of right-sided pleural effusion post thoracentesis. No pneumothorax. Electronically Signed   By: Sandi Mariscal M.D.   On: 02/14/2019 10:58   US Thoracentesis Asp Pleural Space W/img Guide  Result Date: 02/14/2019 INDICATION: History of lung cancer, now with recurrent symptomatic malignant pleural effusion. Please from ultrasound-guided thoracentesis for diagnostic and therapeutic purposes. EXAM: US THORACENTESIS ASP PLEURAL SPACE W/IMG GUIDE COMPARISON:  Ultrasound-guided thoracentesis-02/07/2019 yielding 1.2 L fluid. Chest radiograph-earlier same day; 02/10/2019; chest CT-02/04/2019 MEDICATIONS: None. COMPLICATIONS: None immediate. TECHNIQUE: Informed written consent was obtained from the the patient's daughter after a discussion of the risks, benefits and alternatives to treatment. A timeout was performed prior to the initiation of the procedure. Patient was positioned left lateral decubitus in her hospital bed with initial ultrasound scanning demonstrating a recurrent moderate to large sized anechoic right-sided pleural effusion. The inferolateral aspect of the right lower chest was prepped and draped in the usual sterile fashion. 1% lidocaine was used for local anesthesia. An ultrasound image was saved for documentation purposes. An 8 Fr Safe-T-Centesis catheter was introduced. The thoracentesis was performed. The catheter was removed and a dressing was applied. The patient tolerated the procedure well without immediate post procedural complication. The patient was escorted to have an upright chest radiograph. FINDINGS: A total of approximately 1.4 liters of serous fluid was removed. Requested samples were sent to the laboratory. IMPRESSION: Successful ultrasound-guided right sided thoracentesis yielding 1.4 liters of pleural fluid. Electronically Signed   By: Sandi Mariscal M.D.   On:  02/14/2019 11:00    Management plans discussed with the patient, family and they are in agreement.  CODE STATUS:     Code Status Orders  (From admission, onward)         Start     Ordered   02/04/19 0009  Full code  Continuous     02/04/19 0009        Code Status History    This patient has a current code status but no historical code status.   Advance Care Planning Activity      TOTAL TIME TAKING CARE OF THIS PATIENT: 35 minutes.    Loletha Grayer M.D on 02/14/2019 at 3:08 PM  Between 7am to 6pm - Pager - [phone]  After 6pm go to [email or web address] - password [email or web address] [facility]  [phone]  CC: Primary care physician; Cathy Lange, NP

## 2019-02-14 NOTE — Progress Notes (Addendum)
Discharge order received. Patient mental status is at baseline. Vital signs stable . No signs of acute distress. Discharge instructions given.Report given to [facility] resources nurse. Pt was in and out cath  upon discharge. No other issues noted at this time. Family was notified upon EMS transport.

## 2019-02-14 NOTE — Progress Notes (Signed)
Pharmacy Electrolyte Monitoring Consult:  Pharmacy consulted to assist in monitoring and replacing electrolytes in this 78 y.o. 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   Labs:  Sodium (mmol/L)  Date Value  02/14/2019 132 (L)   Potassium (mmol/L)  Date Value  02/14/2019 4.8   Magnesium (mg/dL)  Date Value  02/12/2019 1.9   Phosphorus (mg/dL)  Date Value  02/12/2019 2.8   Calcium (mg/dL)  Date Value  02/14/2019 9.0   Albumin (g/dL)  Date Value  02/08/2019 3.1 (L)    Assessment/Plan:  Hypertonic saline stopped on 7/27: sodium is slowly trending down. MD aware, no intervention at this time  No further electrolyte supplementation warranted at this time  F/U electrolyte levels in am  Pharmacy will continue to monitor and adjust per consult.   Dallie Piles, PharmD 02/14/2019 7:57 AM

## 2019-02-15 LAB — PROTEIN, BODY FLUID (OTHER): Total Protein, Body Fluid Other: 2.3 g/dL

## 2019-02-15 LAB — PATHOLOGIST SMEAR REVIEW

## 2019-02-16 ENCOUNTER — Encounter: Payer: Self-pay | Admitting: *Deleted

## 2019-02-16 DIAGNOSIS — C3490 Malignant neoplasm of unspecified part of unspecified bronchus or lung: Secondary | ICD-10-CM

## 2019-02-16 NOTE — Progress Notes (Signed)
  Oncology Nurse Navigator Documentation  Navigator Location: [facility] (02/16/19 1000)   )Navigator Encounter Type: Other (02/16/19 1000)   Abnormal Finding Date: 02/04/19 (02/16/19 1000) Confirmed Diagnosis Date: 02/10/19 (02/16/19 1000)                   Barriers/Navigation Needs: Coordination of Care (02/16/19 1000)   Interventions: Coordination of Care (02/16/19 1000)   Coordination of Care: Appts;Radiology;Pathology (02/16/19 1000)        Acuity: Level 2 (02/16/19 1000)   Acuity Level 2: Initial guidance, education and coordination as needed;Educational needs;Assistance expediting appointments (02/16/19 1000)    Pt scheduled for PET scan on Tues 8/11 at 9:30am. Alfonzo Beers has been requested. Pt has scheduled follow up with Dr. Tasia Catchings on Thurs 8/6 at 2pm. Will follow up with patient and introduce to navigator services at clinic visit. Nothing further needed at this time. Time Spent with Patient: 30 (02/16/19 1000)

## 2019-02-17 ENCOUNTER — Other Ambulatory Visit: Payer: Medicare Other

## 2019-02-17 ENCOUNTER — Encounter: Payer: Self-pay | Admitting: *Deleted

## 2019-02-17 ENCOUNTER — Inpatient Hospital Stay: Payer: Medicare Other | Admitting: Oncology

## 2019-02-17 LAB — BODY FLUID CULTURE: Culture: NO GROWTH

## 2019-02-17 NOTE — Progress Notes (Signed)
  Oncology Nurse Navigator Documentation  Navigator Location: [facility] (02/17/19 1100)   )Navigator Encounter Type: Introductory Phone Call (02/17/19 1100)                         Barriers/Navigation Needs: No Barriers At This Time (02/17/19 1100)   Interventions: None Required (02/17/19 1100)           phone call made to pt's daughter, Roland Rack, to introduce to navigator services and answer any questions they may have. All questions answered during phone call. Discuss what to expect at appt and reviewed appt scheduled for PET scan on Tues 8/11. Contact info given and instructed to call if has any further questions or needs. Constance verbalized understanding.           Time Spent with Patient: 30 (02/17/19 1100)

## 2019-02-17 NOTE — Progress Notes (Signed)
Tumor Board Documentation  Cathy Buck was presented by Dr Tasia Catchings at our Tumor Board on 02/17/2019, which included representatives from medical oncology, radiation oncology, pathology, radiology, surgical, navigation, internal medicine, research, palliative care.  Cathy Buck currently presents as a current patient, for [place], for new positive pathology with history of the following treatments: active survellience, surgical intervention(s).  Additionally, we reviewed previous medical and familial history, history of present illness, and recent lab results along with all available histopathologic and imaging studies. The tumor board considered available treatment options and made the following recommendations: Additional screening    The following procedures/referrals were also placed: No orders of the defined types were placed in this encounter.   Clinical Trial Status: not discussed   Staging used: AJCC Stage Group, To be determined  AJCC Staging:       Group: Stage 4 Lung Cancer Adenocarcinoma  National site-specific guidelines NCCN were discussed with respect to the case.  Tumor board is a meeting of clinicians from various specialty areas who evaluate and discuss patients for whom a multidisciplinary approach is being considered. Final determinations in the plan of care are those of the provider(s). The responsibility for follow up of recommendations given during tumor board is that of the provider.   Today's extended care, comprehensive team conference, Cathy Buck was not present for the discussion and was not examined.   Multidisciplinary Tumor Board is a multidisciplinary case peer review process.  Decisions discussed in the Multidisciplinary Tumor Board reflect the opinions of the specialists present at the conference without having examined the patient.  Ultimately, treatment and diagnostic decisions rest with the primary provider(s) and the patient.

## 2019-02-18 ENCOUNTER — Encounter: Payer: Self-pay | Admitting: Oncology

## 2019-02-18 ENCOUNTER — Inpatient Hospital Stay
Admission: EM | Admit: 2019-02-18 | Discharge: 2019-03-09 | DRG: 871 | Disposition: A | Discharge status: Hospice | Payer: Medicare Other | Attending: Internal Medicine | Admitting: Internal Medicine

## 2019-02-18 ENCOUNTER — Emergency Department: Payer: Medicare Other

## 2019-02-18 ENCOUNTER — Inpatient Hospital Stay (HOSPITAL_BASED_OUTPATIENT_CLINIC_OR_DEPARTMENT_OTHER): Payer: Medicare Other | Admitting: Oncology

## 2019-02-18 ENCOUNTER — Inpatient Hospital Stay: Payer: Medicare Other

## 2019-02-18 ENCOUNTER — Other Ambulatory Visit: Payer: Self-pay

## 2019-02-18 ENCOUNTER — Encounter: Payer: Self-pay | Admitting: Emergency Medicine

## 2019-02-18 VITALS — BP 87/55 | HR 106 | Temp 95.7°F | Resp 24

## 2019-02-18 DIAGNOSIS — D508 Other iron deficiency anemias: Secondary | ICD-10-CM | POA: Diagnosis not present

## 2019-02-18 DIAGNOSIS — K5900 Constipation, unspecified: Secondary | ICD-10-CM | POA: Diagnosis present

## 2019-02-18 DIAGNOSIS — D509 Iron deficiency anemia, unspecified: Secondary | ICD-10-CM | POA: Diagnosis present

## 2019-02-18 DIAGNOSIS — J302 Other seasonal allergic rhinitis: Secondary | ICD-10-CM | POA: Diagnosis present

## 2019-02-18 DIAGNOSIS — R4182 Altered mental status, unspecified: Secondary | ICD-10-CM | POA: Diagnosis present

## 2019-02-18 DIAGNOSIS — C799 Secondary malignant neoplasm of unspecified site: Secondary | ICD-10-CM | POA: Diagnosis not present

## 2019-02-18 DIAGNOSIS — J9602 Acute respiratory failure with hypercapnia: Secondary | ICD-10-CM | POA: Diagnosis present

## 2019-02-18 DIAGNOSIS — J90 Pleural effusion, not elsewhere classified: Secondary | ICD-10-CM | POA: Diagnosis present

## 2019-02-18 DIAGNOSIS — J449 Chronic obstructive pulmonary disease, unspecified: Secondary | ICD-10-CM | POA: Diagnosis not present

## 2019-02-18 DIAGNOSIS — Z9049 Acquired absence of other specified parts of digestive tract: Secondary | ICD-10-CM

## 2019-02-18 DIAGNOSIS — Z888 Allergy status to other drugs, medicaments and biological substances status: Secondary | ICD-10-CM | POA: Diagnosis not present

## 2019-02-18 DIAGNOSIS — C3490 Malignant neoplasm of unspecified part of unspecified bronchus or lung: Secondary | ICD-10-CM

## 2019-02-18 DIAGNOSIS — R0689 Other abnormalities of breathing: Secondary | ICD-10-CM | POA: Diagnosis not present

## 2019-02-18 DIAGNOSIS — J9601 Acute respiratory failure with hypoxia: Secondary | ICD-10-CM | POA: Diagnosis present

## 2019-02-18 DIAGNOSIS — Z20828 Contact with and (suspected) exposure to other viral communicable diseases: Secondary | ICD-10-CM | POA: Diagnosis present

## 2019-02-18 DIAGNOSIS — B961 Klebsiella pneumoniae [K. pneumoniae] as the cause of diseases classified elsewhere: Secondary | ICD-10-CM | POA: Diagnosis not present

## 2019-02-18 DIAGNOSIS — J9811 Atelectasis: Secondary | ICD-10-CM | POA: Diagnosis present

## 2019-02-18 DIAGNOSIS — A414 Sepsis due to anaerobes: Principal | ICD-10-CM | POA: Diagnosis present

## 2019-02-18 DIAGNOSIS — N390 Urinary tract infection, site not specified: Secondary | ICD-10-CM | POA: Diagnosis not present

## 2019-02-18 DIAGNOSIS — Z978 Presence of other specified devices: Secondary | ICD-10-CM | POA: Diagnosis not present

## 2019-02-18 DIAGNOSIS — Z881 Allergy status to other antibiotic agents status: Secondary | ICD-10-CM | POA: Diagnosis not present

## 2019-02-18 DIAGNOSIS — D649 Anemia, unspecified: Secondary | ICD-10-CM

## 2019-02-18 DIAGNOSIS — K219 Gastro-esophageal reflux disease without esophagitis: Secondary | ICD-10-CM | POA: Diagnosis present

## 2019-02-18 DIAGNOSIS — C384 Malignant neoplasm of pleura: Secondary | ICD-10-CM | POA: Diagnosis present

## 2019-02-18 DIAGNOSIS — L8990 Pressure ulcer of unspecified site, unspecified stage: Secondary | ICD-10-CM | POA: Insufficient documentation

## 2019-02-18 DIAGNOSIS — C3491 Malignant neoplasm of unspecified part of right bronchus or lung: Secondary | ICD-10-CM | POA: Diagnosis present

## 2019-02-18 DIAGNOSIS — E872 Acidosis: Secondary | ICD-10-CM | POA: Diagnosis present

## 2019-02-18 DIAGNOSIS — R7881 Bacteremia: Secondary | ICD-10-CM | POA: Diagnosis not present

## 2019-02-18 DIAGNOSIS — Z9889 Other specified postprocedural states: Secondary | ICD-10-CM

## 2019-02-18 DIAGNOSIS — A0472 Enterocolitis due to Clostridium difficile, not specified as recurrent: Secondary | ICD-10-CM | POA: Diagnosis present

## 2019-02-18 DIAGNOSIS — E119 Type 2 diabetes mellitus without complications: Secondary | ICD-10-CM | POA: Diagnosis present

## 2019-02-18 DIAGNOSIS — J910 Malignant pleural effusion: Secondary | ICD-10-CM | POA: Diagnosis present

## 2019-02-18 DIAGNOSIS — E785 Hyperlipidemia, unspecified: Secondary | ICD-10-CM | POA: Diagnosis present

## 2019-02-18 DIAGNOSIS — R590 Localized enlarged lymph nodes: Secondary | ICD-10-CM | POA: Diagnosis present

## 2019-02-18 DIAGNOSIS — R000 Tachycardia, unspecified: Secondary | ICD-10-CM

## 2019-02-18 DIAGNOSIS — Z515 Encounter for palliative care: Secondary | ICD-10-CM

## 2019-02-18 DIAGNOSIS — I4891 Unspecified atrial fibrillation: Secondary | ICD-10-CM | POA: Diagnosis present

## 2019-02-18 DIAGNOSIS — G9341 Metabolic encephalopathy: Secondary | ICD-10-CM | POA: Diagnosis present

## 2019-02-18 DIAGNOSIS — Z886 Allergy status to analgesic agent status: Secondary | ICD-10-CM

## 2019-02-18 DIAGNOSIS — E222 Syndrome of inappropriate secretion of antidiuretic hormone: Secondary | ICD-10-CM | POA: Diagnosis present

## 2019-02-18 DIAGNOSIS — E559 Vitamin D deficiency, unspecified: Secondary | ICD-10-CM | POA: Diagnosis present

## 2019-02-18 DIAGNOSIS — B964 Proteus (mirabilis) (morganii) as the cause of diseases classified elsewhere: Secondary | ICD-10-CM | POA: Diagnosis present

## 2019-02-18 DIAGNOSIS — R059 Cough, unspecified: Secondary | ICD-10-CM

## 2019-02-18 DIAGNOSIS — Z7901 Long term (current) use of anticoagulants: Secondary | ICD-10-CM | POA: Diagnosis not present

## 2019-02-18 DIAGNOSIS — C7800 Secondary malignant neoplasm of unspecified lung: Secondary | ICD-10-CM | POA: Diagnosis present

## 2019-02-18 DIAGNOSIS — R404 Transient alteration of awareness: Secondary | ICD-10-CM | POA: Diagnosis not present

## 2019-02-18 DIAGNOSIS — N3000 Acute cystitis without hematuria: Secondary | ICD-10-CM | POA: Diagnosis present

## 2019-02-18 DIAGNOSIS — Z87891 Personal history of nicotine dependence: Secondary | ICD-10-CM

## 2019-02-18 DIAGNOSIS — Z885 Allergy status to narcotic agent status: Secondary | ICD-10-CM

## 2019-02-18 DIAGNOSIS — Z66 Do not resuscitate: Secondary | ICD-10-CM

## 2019-02-18 DIAGNOSIS — Z90710 Acquired absence of both cervix and uterus: Secondary | ICD-10-CM

## 2019-02-18 DIAGNOSIS — R400 Somnolence: Secondary | ICD-10-CM | POA: Diagnosis not present

## 2019-02-18 DIAGNOSIS — J9600 Acute respiratory failure, unspecified whether with hypoxia or hypercapnia: Secondary | ICD-10-CM

## 2019-02-18 DIAGNOSIS — I10 Essential (primary) hypertension: Secondary | ICD-10-CM | POA: Diagnosis present

## 2019-02-18 DIAGNOSIS — R05 Cough: Secondary | ICD-10-CM

## 2019-02-18 DIAGNOSIS — R627 Adult failure to thrive: Secondary | ICD-10-CM

## 2019-02-18 DIAGNOSIS — Z7189 Other specified counseling: Secondary | ICD-10-CM | POA: Diagnosis not present

## 2019-02-18 DIAGNOSIS — R531 Weakness: Secondary | ICD-10-CM | POA: Diagnosis not present

## 2019-02-18 DIAGNOSIS — E871 Hypo-osmolality and hyponatremia: Secondary | ICD-10-CM | POA: Diagnosis not present

## 2019-02-18 DIAGNOSIS — I959 Hypotension, unspecified: Secondary | ICD-10-CM | POA: Diagnosis not present

## 2019-02-18 DIAGNOSIS — Z4682 Encounter for fitting and adjustment of non-vascular catheter: Secondary | ICD-10-CM

## 2019-02-18 DIAGNOSIS — E875 Hyperkalemia: Secondary | ICD-10-CM | POA: Diagnosis present

## 2019-02-18 DIAGNOSIS — R571 Hypovolemic shock: Secondary | ICD-10-CM | POA: Diagnosis not present

## 2019-02-18 DIAGNOSIS — Z79899 Other long term (current) drug therapy: Secondary | ICD-10-CM

## 2019-02-18 DIAGNOSIS — D72829 Elevated white blood cell count, unspecified: Secondary | ICD-10-CM | POA: Diagnosis not present

## 2019-02-18 LAB — COMPREHENSIVE METABOLIC PANEL
ALT: 49 U/L — ABNORMAL HIGH (ref 0–44)
AST: 37 U/L (ref 15–41)
Albumin: 2.9 g/dL — ABNORMAL LOW (ref 3.5–5.0)
Alkaline Phosphatase: 81 U/L (ref 38–126)
Anion gap: 12 (ref 5–15)
BUN: 48 mg/dL — ABNORMAL HIGH (ref 8–23)
CO2: 34 mmol/L — ABNORMAL HIGH (ref 22–32)
Calcium: 9.7 mg/dL (ref 8.9–10.3)
Chloride: 83 mmol/L — ABNORMAL LOW (ref 98–111)
Creatinine, Ser: 0.94 mg/dL (ref 0.44–1.00)
GFR calc Af Amer: >60 mL/min (ref >60)
GFR calc non Af Amer: 58 mL/min — ABNORMAL LOW (ref >60)
Glucose, Bld: 152 mg/dL — ABNORMAL HIGH (ref 70–99)
Potassium: 5.2 mmol/L — ABNORMAL HIGH (ref 3.5–5.1)
Sodium: 129 mmol/L — ABNORMAL LOW (ref 135–145)
Total Bilirubin: 0.8 mg/dL (ref 0.3–1.2)
Total Protein: 6.8 g/dL (ref 6.5–8.1)

## 2019-02-18 LAB — BLOOD CULTURE ID PANEL (REFLEXED)

## 2019-02-18 LAB — TROPONIN I (HIGH SENSITIVITY)
Troponin I (High Sensitivity): 53 ng/L — ABNORMAL HIGH (ref <18)
Troponin I (High Sensitivity): 47 ng/L — ABNORMAL HIGH (ref <18)

## 2019-02-18 LAB — SARS CORONAVIRUS 2 BY RT PCR (HOSPITAL ORDER, PERFORMED IN ~~LOC~~ HOSPITAL LAB): SARS Coronavirus 2: NEGATIVE

## 2019-02-18 LAB — CBC WITH DIFFERENTIAL/PLATELET
Abs Immature Granulocytes: 0.62 10*3/uL — ABNORMAL HIGH (ref 0.00–0.07)
Basophils Absolute: 0.1 10*3/uL (ref 0.0–0.1)
Basophils Relative: 1 %
Eosinophils Absolute: 0 10*3/uL (ref 0.0–0.5)
Eosinophils Relative: 0 %
HCT: 35.6 % — ABNORMAL LOW (ref 36.0–46.0)
Hemoglobin: 11 g/dL — ABNORMAL LOW (ref 12.0–15.0)
Immature Granulocytes: 6 %
Lymphocytes Relative: 5 %
Lymphs Abs: 0.6 10*3/uL — ABNORMAL LOW (ref 0.7–4.0)
MCH: 24.6 pg — ABNORMAL LOW (ref 26.0–34.0)
MCHC: 30.9 g/dL (ref 30.0–36.0)
MCV: 79.5 fL — ABNORMAL LOW (ref 80.0–100.0)
Monocytes Absolute: 0.3 10*3/uL (ref 0.1–1.0)
Monocytes Relative: 3 %
Neutro Abs: 9.7 10*3/uL — ABNORMAL HIGH (ref 1.7–7.7)
Neutrophils Relative %: 85 %
Platelets: 253 10*3/uL (ref 150–400)
RBC: 4.48 MIL/uL (ref 3.87–5.11)
RDW: 15.1 % (ref 11.5–15.5)
Smear Review: NORMAL
WBC: 11.3 10*3/uL — ABNORMAL HIGH (ref 4.0–10.5)
nRBC: 0.2 % (ref 0.0–0.2)

## 2019-02-18 LAB — GLUCOSE, CAPILLARY
Glucose-Capillary: 100 mg/dL — ABNORMAL HIGH (ref 70–99)
Glucose-Capillary: 123 mg/dL — ABNORMAL HIGH (ref 70–99)

## 2019-02-18 LAB — TSH: TSH: 1.758 u[IU]/mL (ref 0.350–4.500)

## 2019-02-18 LAB — ETHANOL: Alcohol, Ethyl (B): <10 mg/dL (ref <10)

## 2019-02-18 LAB — LIPASE, BLOOD: Lipase: 25 U/L (ref 11–51)

## 2019-02-18 LAB — ACETAMINOPHEN LEVEL: Acetaminophen (Tylenol), Serum: <10 ug/mL — ABNORMAL LOW (ref 10–30)

## 2019-02-18 LAB — SALICYLATE LEVEL: Salicylate Lvl: <7 mg/dL (ref 2.8–30.0)

## 2019-02-18 LAB — LACTIC ACID, PLASMA: Lactic Acid, Venous: 1.6 mmol/L (ref 0.5–1.9)

## 2019-02-18 MED ORDER — PANTOPRAZOLE SODIUM 40 MG PO TBEC
40.0000 mg | DELAYED_RELEASE_TABLET | Freq: Every day | ORAL | Status: DC
  Administered 2019-02-19 – 2019-03-06 (×16): 40 mg via ORAL
  Filled 2019-02-18 (×16): qty 1

## 2019-02-18 MED ORDER — VITAMIN C 500 MG PO TABS
250.0000 mg | ORAL_TABLET | Freq: Two times a day (BID) | ORAL | Status: DC
  Administered 2019-02-18 – 2019-03-07 (×33): 250 mg via ORAL
  Filled 2019-02-18 (×34): qty 1

## 2019-02-18 MED ORDER — POLYETHYLENE GLYCOL 3350 17 G PO PACK
17.0000 g | PACK | Freq: Every day | ORAL | Status: DC
  Administered 2019-02-19 – 2019-02-26 (×7): 17 g via ORAL
  Filled 2019-02-18 (×7): qty 1

## 2019-02-18 MED ORDER — SODIUM CHLORIDE 0.9 % IV SOLN
2.0000 g | INTRAVENOUS | Status: DC
  Administered 2019-02-18 – 2019-02-22 (×5): 2 g via INTRAVENOUS
  Filled 2019-02-18 (×3): qty 2
  Filled 2019-02-18 (×4): qty 20
  Filled 2019-02-18: qty 2

## 2019-02-18 MED ORDER — ATENOLOL 50 MG PO TABS: 50.0000 mg | ORAL_TABLET | Freq: Every day | ORAL | Status: DC

## 2019-02-18 MED ORDER — ENOXAPARIN SODIUM 40 MG/0.4ML ~~LOC~~ SOLN: 40.0000 mg | SUBCUTANEOUS | Status: DC

## 2019-02-18 MED ORDER — ADULT MULTIVITAMIN W/MINERALS CH
1.0000 | ORAL_TABLET | Freq: Every day | ORAL | Status: DC
  Administered 2019-02-19 – 2019-03-07 (×17): 1 via ORAL
  Filled 2019-02-18 (×17): qty 1

## 2019-02-18 MED ORDER — SODIUM CHLORIDE 0.9 % IV BOLUS
500.0000 mL | Freq: Once | INTRAVENOUS | Status: AC
  Administered 2019-02-18: 500 mL via INTRAVENOUS

## 2019-02-18 MED ORDER — VITAMIN B-6 50 MG PO TABS: 500.0000 mg | ORAL_TABLET | Freq: Two times a day (BID) | ORAL | Status: DC

## 2019-02-18 MED ORDER — PRO-STAT SUGAR FREE PO LIQD
30.0000 mL | Freq: Every day | ORAL | Status: DC
  Administered 2019-02-19 – 2019-03-05 (×11): 30 mL via ORAL

## 2019-02-18 MED ORDER — CALCIUM-VITAMIN D 500-200 MG-UNIT PO TABS
1.0000 | ORAL_TABLET | Freq: Every day | ORAL | Status: DC
  Filled 2019-02-18: qty 1

## 2019-02-18 MED ORDER — SODIUM CHLORIDE 0.9 % IV SOLN
INTRAVENOUS | Status: DC | PRN
  Administered 2019-02-18: 50 mL via INTRAVENOUS
  Administered 2019-02-25: 23:00:00 250 mL via INTRAVENOUS
  Administered 2019-02-26: 10 mL via INTRAVENOUS
  Administered 2019-02-27 – 2019-03-01 (×2): 250 mL via INTRAVENOUS

## 2019-02-18 MED ORDER — IPRATROPIUM-ALBUTEROL 0.5-2.5 (3) MG/3ML IN SOLN: 3.0000 mL | Freq: Four times a day (QID) | RESPIRATORY_TRACT | Status: DC | PRN

## 2019-02-18 MED ORDER — CALCIUM CARBONATE-VITAMIN D 500-200 MG-UNIT PO TABS
1.0000 | ORAL_TABLET | Freq: Every day | ORAL | Status: DC
  Administered 2019-02-19 – 2019-03-07 (×16): 1 via ORAL
  Filled 2019-02-18 (×16): qty 1

## 2019-02-18 MED ORDER — DILTIAZEM HCL 30 MG PO TABS
30.0000 mg | ORAL_TABLET | Freq: Three times a day (TID) | ORAL | Status: DC
  Administered 2019-02-18 – 2019-02-19 (×3): 30 mg via ORAL
  Filled 2019-02-18 (×3): qty 1

## 2019-02-18 MED ORDER — ENOXAPARIN SODIUM 100 MG/ML ~~LOC~~ SOLN
100.0000 mg | Freq: Two times a day (BID) | SUBCUTANEOUS | Status: DC
  Administered 2019-02-18 – 2019-02-22 (×8): 100 mg via SUBCUTANEOUS
  Filled 2019-02-18 (×10): qty 1

## 2019-02-18 NOTE — Progress Notes (Signed)
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 Patient appears to be confused, not able to provide any history, this is different than her baseline mental status. Blood pressure in the clinic was low at 87/55.  Daughter has also called and reported that patient's mental status has been decreased since 2 days ago.  Review of Systems  Unable to perform ROS: Mental status change      Allergies  Allergen Reactions   Codeine Hives   Atorvastatin Other (See Comments)   Citalopram Other (See Comments)    Nausea   Keflex [Cephalexin] Diarrhea   Tylenol [Acetaminophen] Hives     Past Medical History:  Diagnosis Date   Diabetes mellitus without complication ([place])    Hypertension      Past Surgical History:  Procedure Laterality Date   ABDOMINAL HYSTERECTOMY     CHOLECYSTECTOMY      Social History   Socioeconomic History   Marital status: Married    Spouse name: Not on file   Number of children: Not on file   Years of education: Not on file   Highest education level: Not on file  Occupational History   Not on file  Social Needs   Financial resource strain: Not hard at all   Food insecurity    Worry: Never true    Inability: Never true   Transportation needs    Medical: No    Non-medical: No  Tobacco Use   Smoking status: Former Smoker    Smokeless tobacco: Never Used  Substance and Sexual Activity   Alcohol use: Not Currently   Drug use: Not Currently   Sexual activity: Not Currently  Lifestyle   Physical activity    Days per week: 0 days    Minutes per session: 0 min   Stress: Not at all  Relationships   Social connections    Talks on phone: Once a week    Gets together: Once a week    Attends religious service: 1 to 4 times per year    Active member of club or organization: No    Attends meetings of clubs or organizations: Never    Relationship status: Married   Intimate partner violence    Fear of current or ex partner: No    Emotionally abused: No    Physically abused: No    Forced sexual activity: No  Other Topics Concern   Not on file  Social History Narrative   Lives at home with husband.  Uses cane, walker and rollator a times    No family history on file.  No current facility-administered medications for this visit.  No current outpatient medications on file.  Facility-Administered Medications Ordered in Other Visits:    0.9 %  sodium chloride infusion, , Intravenous, PRN, Lang Snow, NP, Last Rate: 10 mL/hr at 02/18/19 2259, 50 mL at 02/18/19 2259   [START ON 02/19/2019] calcium-vitamin D (OSCAL WITH D) 500-200 MG-UNIT per tablet 1  tablet, 1 tablet, Oral, Q breakfast, Ouma, Bing Neighbors, NP   cefTRIAXone (ROCEPHIN) 2 g in sodium chloride 0.9 % 100 mL IVPB, 2 g, Intravenous, Q24H, Ouma, Bing Neighbors, NP, Last Rate: 200 mL/hr at 02/18/19 2302, 2 g at 02/18/19 2302   diltiazem (CARDIZEM) tablet 30 mg, 30 mg, Oral, Q8H, Ouma, Bing Neighbors, NP, 30 mg at 02/18/19 1647   enoxaparin (LOVENOX) injection 100 mg, 100 mg, Subcutaneous, BID, Ouma, Bing Neighbors, NP, 100 mg at 02/18/19 1832   feeding supplement (PRO-STAT SUGAR FREE 64) liquid 30 mL, 30 mL, Oral, Daily, Ouma, Bing Neighbors, NP   ipratropium-albuterol (DUONEB) 0.5-2.5 (3) MG/3ML nebulizer  solution 3 mL, 3 mL, Nebulization, Q6H PRN, Lang Snow, NP   [START ON 02/19/2019] multivitamin with minerals tablet 1 tablet, 1 tablet, Oral, Daily, Ouma, Bing Neighbors, NP   [START ON 02/19/2019] pantoprazole (PROTONIX) EC tablet 40 mg, 40 mg, Oral, Daily, Ouma, Bing Neighbors, NP   [START ON 02/19/2019] polyethylene glycol (MIRALAX / GLYCOLAX) packet 17 g, 17 g, Oral, Daily, Ouma, Bing Neighbors, NP   vitamin C (ASCORBIC ACID) tablet 250 mg, 250 mg, Oral, BID, Lang Snow, NP, 250 mg at 02/18/19 1650  Physical exam:  Vitals:   02/18/19 0905  BP: (!) 87/55  Pulse: (!) 106  Resp: (!) 24  Temp: (!) 95.7 F (35.4 C)  SpO2: 95%   Physical Exam Constitutional:      Appearance: She is ill-appearing.     Comments: Confused  HENT:     Head: Normocephalic and atraumatic.  Eyes:     General: No scleral icterus.    Pupils: Pupils are equal, round, and reactive to light.  Neck:     Musculoskeletal: Normal range of motion and neck supple.  Cardiovascular:     Rate and Rhythm: Rhythm irregular.     Comments: Tachycardia Pulmonary:     Breath sounds: No wheezing.     Comments: Tachypneic, breathing via nasal cannula oxygen decreased breath sound bilaterally. Abdominal:     General: Bowel sounds are normal. There is no distension.     Palpations: Abdomen is soft.     Tenderness: There is no abdominal tenderness.  Musculoskeletal: Normal range of motion.  Skin:    General: Skin is warm and dry.     Findings: No erythema or rash.  Neurological:     Cranial Nerves: No cranial nerve deficit.     Coordination: Coordination normal.     Comments: Confused     CMP Latest Ref Rng & Units 02/18/2019  Glucose 70 - 99 mg/dL 152(H)  BUN 8 - 23 mg/dL 48(H)  Creatinine 0.44 - 1.00 mg/dL 0.94  Sodium 135 - 145 mmol/L 129(L)  Potassium 3.5 - 5.1 mmol/L 5.2(H)  Chloride 98 - 111 mmol/L 83(L)  CO2 22 - 32 mmol/L 34(H)  Calcium 8.9 - 10.3 mg/dL 9.7  Total Protein  6.5 - 8.1 g/dL 6.8  Total Bilirubin 0.3 - 1.2 mg/dL 0.8  Alkaline Phos 38 - 126 U/L 81  AST 15 - 41 U/L 37  ALT 0 - 44 U/L 49(H)   CBC Latest Ref Rng & Units 02/18/2019  WBC 4.0 - 10.5 K/uL 11.3(H)  Hemoglobin 12.0 - 15.0 g/dL 11.0(L)  Hematocrit 36.0 - 46.0 % 35.6(L)  Platelets 150 - 400 K/uL 253    Dg Chest 2 View  Result Date: 02/10/2019 CLINICAL DATA:  Pleural effusion EXAM: CHEST - 2 VIEW COMPARISON:  Two days ago FINDINGS: Right PICC with tip  at the right atrium. Haziness in the lower right chest. Hyperinflation with diaphragm flattening. Thickened right hilum. There was adenopathy on preceding chest CT. Normal heart size. IMPRESSION: Unchanged right pleural effusion and lower lobe opacification. Electronically Signed   By: Monte Fantasia M.D.   On: 02/10/2019 10:39   Ct Head Wo Contrast  Result Date: 02/18/2019 CLINICAL DATA:  Altered level of consciousness, unexplained, lethargy, hypotension, diabetes mellitus, hypertension, lung cancer EXAM: CT HEAD WITHOUT CONTRAST TECHNIQUE: Contiguous axial images were obtained from the base of the skull through the vertex without intravenous contrast. Sagittal and coronal MPR images reconstructed from axial data set. COMPARISON:  MR brain 02/07/2019 FINDINGS: Brain: Mild generalized atrophy. Normal ventricular morphology. No midline shift or mass effect.2 dense calcification of falx. No intracranial hemorrhage, mass lesion or evidence of acute infarction. No extra-axial fluid collections. Vascular: No hyperdense vessels Skull: Intact Sinuses/Orbits: Clear Other: N/A IMPRESSION: No acute intracranial abnormalities. Electronically Signed   By: Lavonia Dana M.D.   On: 02/18/2019 11:14   Ct Chest Wo Contrast  Result Date: 02/04/2019 CLINICAL DATA:  Shortness of breath EXAM: CT CHEST WITHOUT CONTRAST TECHNIQUE: Multidetector CT imaging of the chest was performed following the standard protocol without IV contrast. COMPARISON:  Plain film from previous day  FINDINGS: Cardiovascular: Somewhat limited due to lack of IV contrast. Aortic calcifications are noted without significant aneurysmal dilatation. No cardiac enlargement is seen. No pericardial effusion is noted. Mediastinum/Nodes: The esophagus is within normal limits. No definitive hilar adenopathy is noted. There is a right paratracheal node which measures approximately 16 mm in short axis as well as a subcarinal node which measures 17 mm in short axis. These are likely reactive in nature. Thoracic inlet is within normal limits. Lungs/Pleura: The left lung is well aerated with minimal atelectatic changes in the lingula along the cardiac border. No sizable effusion is noted. The right hemithorax demonstrates a moderate to large pleural effusion increased from the prior exam. Underlying patchy infiltrate is noted throughout the right lung with more marked consolidation in the right lung base. No definitive nodule is noted. Upper Abdomen: Somewhat limited due to lack of IV contrast. No definitive abnormality is noted. Musculoskeletal: Mild degenerative change of the thoracic spine is noted. No acute bony abnormality is seen. IMPRESSION: Moderate to large right-sided pleural effusion with associated consolidation most marked in the right lower lobe. Mild left basilar atelectasis. Prominent mediastinal lymph nodes likely reactive in nature. Aortic Atherosclerosis (ICD10-I70.0). Electronically Signed   By: Inez Catalina M.D.   On: 02/04/2019 15:36   Mr Brain Wo Contrast  Result Date: 02/18/2019 CLINICAL DATA:  Altered level of consciousness with change in speech. History of hypertension diabetes and lung cancer. EXAM: MRI HEAD WITHOUT CONTRAST TECHNIQUE: Multiplanar, multiecho pulse sequences of the brain and surrounding structures were obtained without intravenous contrast. COMPARISON:  CT head 02/18/2019 FINDINGS: Brain: Motion degraded study. Rapid scanning utilized due to motion. Negative for acute infarct.  Negative for mass or edema. Ventricle size normal. No hemorrhage or fluid collection. Scattered small white matter hyperintensities bilaterally. Vascular: Normal arterial flow voids Skull and upper cervical spine: Negative Sinuses/Orbits: Negative Other: None IMPRESSION: Motion degraded study No acute abnormality. Minimal white matter disease which appears chronic and unchanged from prior MRI Electronically Signed   By: Franchot Gallo M.D.   On: 02/18/2019 22:07   Mr Brain Wo Contrast  Result Date: 02/07/2019 CLINICAL DATA:  Initial evaluation for acute confusion, encephalopathy. EXAM: MRI HEAD WITHOUT CONTRAST TECHNIQUE: Multiplanar,  multiecho pulse sequences of the brain and surrounding structures were obtained without intravenous contrast. COMPARISON:  None available. FINDINGS: Brain: Examination mildly degraded by motion artifact. Diffuse prominence of the CSF containing spaces compatible with generalized age-related cerebral atrophy. Mild scattered patchy T2/FLAIR hyperintensity within the periventricular deep white matter both cerebral hemispheres, most consistent with chronic microvascular ischemic disease, mild for age. No abnormal foci of restricted diffusion to suggest acute or subacute ischemia. Gray-white matter differentiation maintained. No encephalomalacia to suggest chronic cortical infarction. No foci of susceptibility artifact to suggest acute or chronic intracranial hemorrhage. No mass lesion, midline shift or mass effect. No hydrocephalus. The extra-axial fluid collection. Pituitary gland within normal limits. Midline structures intact. Vascular: Major intracranial vascular flow voids are maintained Skull and upper cervical spine: Craniocervical junction within normal limits. Multilevel degenerative spondylolysis noted within the visualized upper cervical spine without significant stenosis. Bone marrow signal intensity normal. No scalp soft tissue abnormality. Sinuses/Orbits: Globes and  orbital soft tissues within normal limits. Paranasal sinuses are clear. Left mastoid effusion noted. Inner ear structures grossly normal. Other: None. IMPRESSION: 1. No acute intracranial abnormality. 2. Mild age-related cerebral atrophy with chronic microvascular ischemic disease. 3. Left mastoid effusion, of uncertain significance. Correlation with physical exam and symptomatology for possible otomastoiditis recommended. Electronically Signed   By: Jeannine Boga M.D.   On: 02/07/2019 20:33   Dg Chest Right Decubitus  Result Date: 02/14/2019 CLINICAL DATA:  Pleural effusion. EXAM: CHEST - RIGHT DECUBITUS COMPARISON:  02/10/2019. FINDINGS: Right central line in stable position. Layering moderate sized right pleural effusion is noted. Underlying atelectatic changes are present. No pneumothorax noted. IMPRESSION: Layering moderate sized right pleural effusion. Electronically Signed   By: Marcello Moores  Register   On: 02/14/2019 06:32   Dg Chest Portable 1 View  Result Date: 02/18/2019 CLINICAL DATA:  Weakness.  Confusion. EXAM: PORTABLE CHEST 1 VIEW COMPARISON:  Weakness and confusion. FINDINGS: Interval removal of right PICC line. Heart size normal. Pulmonary venous congestion. Progressive right base atelectasis/infiltrate and right-sided pleural effusion. Left base subsegmental atelectasis again noted. No pneumothorax. IMPRESSION: 1.  Interim removal of right PICC line. 2. Progressive right base atelectasis/infiltrate and right-sided pleural effusion. Mild left base subsegmental atelectasis again noted. Electronically Signed   By: Marcello Moores  Register   On: 02/18/2019 09:54   Dg Chest Port 1 View  Result Date: 02/14/2019 CLINICAL DATA:  History of lung cancer, post right-sided thoracentesis. EXAM: PORTABLE CHEST 1 VIEW COMPARISON:  Ultrasound-guided thoracentesis-02/14/2019; chest radiograph-earlier same day; 01/11/2019; chest CT-02/04/2019 FINDINGS: Grossly unchanged enlarged cardiac silhouette and mediastinal  contours with atherosclerotic plaque within thoracic aorta. Stable position of support apparatus. Interval reduction/resolution of right-sided pleural effusion post thoracentesis. No pneumothorax. Improved aeration of the right lung base with persistent right basilar heterogeneous opacities, likely atelectasis. No new focal airspace opacities. The left hemithorax remains well aerated. No evidence of edema. No acute osseous abnormalities. IMPRESSION: Interval reduction/resolution of right-sided pleural effusion post thoracentesis. No pneumothorax. Electronically Signed   By: Sandi Mariscal M.D.   On: 02/14/2019 10:58   Dg Chest Port 1 View  Result Date: 02/08/2019 CLINICAL DATA:  Followup pleural effusion. EXAM: PORTABLE CHEST 1 VIEW COMPARISON:  02/07/2019 and earlier exams. FINDINGS: Opacity at the right lung base mostly obscures hemidiaphragm consistent with a small effusion and atelectasis. Opacity at the left lung base is less prominent than on the previous day's study, the difference likely due to differences in patient positioning only. There is hazy opacity consistent with a smaller left pleural  effusion. No new lung abnormalities. No evidence of pulmonary edema. No pneumothorax. Right-sided PICC is stable. IMPRESSION: 1. No significant change from the most recent prior study allowing for differences in patient positioning. 2. Small residual right pleural effusion with associated atelectasis. Smaller left pleural effusion with atelectasis. No convincing pulmonary edema. No pneumothorax. Electronically Signed   By: Lajean Manes M.D.   On: 02/08/2019 13:25   Dg Chest Port 1 View  Result Date: 02/07/2019 CLINICAL DATA:  Status post right-sided thoracentesis. EXAM: PORTABLE CHEST 1 VIEW COMPARISON:  02/05/2019 FINDINGS: The heart is enlarged but stable. Stable tortuosity and calcification of the thoracic aorta. The right PICC line is stable. Status post right-sided thoracentesis with interval decrease in  size of the right pleural effusion. No postprocedural pneumothorax is identified. Small left pleural effusion and bibasilar atelectasis. IMPRESSION: Status post right-sided thoracentesis with near complete evacuation of the right-sided pleural effusion. No postprocedural pneumothorax. Persistent small left effusion and moderate bibasilar atelectasis. Electronically Signed   By: Marijo Sanes M.D.   On: 02/07/2019 10:33   Dg Chest Port 1 View  Result Date: 02/07/2019 CLINICAL DATA:  Shortness of breath. Patient experiencing increased shortness of breath. EXAM: PORTABLE CHEST 1 VIEW COMPARISON:  Portable chest radiograph 02/05/2019 FINDINGS: Unchanged position of a right-sided PICC with tip projecting over the upper right atrium. May consider withdrawing catheter 2.5-3 cm to place tip cavoatrial junction. The cardiomediastinal silhouette is unchanged. Interval increase in size of a right pleural effusion with increasing right basilar atelectasis. Underlying right basilar pneumonia cannot be excluded. Ill-defined opacity throughout the mid to lower right lung has also increased and may reflect incomplete atelectasis and/or developing pneumonia. Mild left basilar atelectasis is unchanged. No evidence of pneumothorax. Degenerative changes of the spine. IMPRESSION: Unchanged position of a right PICC with tip projecting over the right atrium. Consider withdrawing catheter 2.5-3 cm to place tip at cavoatrial junction. Interval increase in size of a right pleural effusion with increasing right basilar atelectasis. Underlying right basilar pneumonia cannot be excluded. Interval increase in ill-defined opacity throughout the mid to lower right lung may reflect incomplete atelectasis and/or developing pneumonia. Electronically Signed   By: Kellie Simmering   On: 02/07/2019 08:14   Dg Chest Port 1 View  Result Date: 02/05/2019 CLINICAL DATA:  PICC line placement EXAM: PORTABLE CHEST 1 VIEW COMPARISON:  Portable exam 2225  hours compared to 1631 hours FINDINGS: RIGHT arm PICC line with tip projecting over superior RIGHT atrium. May consider withdrawing catheter 2.5-3.0 cm to place tip at cavoatrial junction. Stable heart size and mediastinal contours. RIGHT pleural effusion and basilar atelectasis with questionable RIGHT perihilar infiltrate. Minimal LEFT base atelectasis. IMPRESSION: Tip of RIGHT arm PICC line projects over RIGHT atrium; recommend withdrawal 2.5 x 3.0 cm to place tip at approximately the cavoatrial junction. Otherwise no interval change. Electronically Signed   By: Lavonia Dana M.D.   On: 02/05/2019 22:34   Dg Chest Port 1 View  Result Date: 02/05/2019 CLINICAL DATA:  PICC line placement EXAM: PORTABLE CHEST 1 VIEW COMPARISON:  Portable exam 1613 hours compared to 02/03/2019 FINDINGS: RIGHT arm PICC line tip deflects into the azygos arch. Upper normal heart size. Mediastinal contours and pulmonary vascularity normal. Atherosclerotic calcification aorta. RIGHT lung infiltrate with small RIGHT pleural effusion and basilar atelectasis. LEFT lung clear. No pneumothorax or acute osseous findings. IMPRESSION: Tip of RIGHT arm PICC line deflects into the azygos arch, recommend repositioning into SVC. Increased RIGHT lung infiltrates, RIGHT pleural effusion and  basilar atelectasis. Findings called to [facility] in ICU on 02/05/2019 at 1839 hrs. Electronically Signed   By: Lavonia Dana M.D.   On: 02/05/2019 18:40   Dg Chest Portable 1 View  Result Date: 02/03/2019 CLINICAL DATA:  Shortness of breath. EXAM: PORTABLE CHEST 1 VIEW COMPARISON:  None. FINDINGS: The heart size and pulmonary vascularity are normal. Aortic atherosclerosis. Small right pleural effusion. Focal linear atelectasis in the right midzone. No acute bone abnormality. IMPRESSION: 1. Small right pleural effusion. Slight atelectasis in the right midzone. 2. Aortic atherosclerosis. Electronically Signed   By: Lorriane Shire M.D.   On: 02/03/2019 18:36   [place]  Ekg Site Rite  Result Date: 02/05/2019 If Site Rite image not attached, placement could not be confirmed due to current cardiac rhythm.  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 Please from ultrasound-guided thoracentesis for diagnostic and therapeutic purposes. EXAM: US THORACENTESIS ASP PLEURAL SPACE W/IMG GUIDE COMPARISON:  Ultrasound-guided thoracentesis-02/07/2019 yielding 1.2 L fluid. Chest radiograph-earlier same day; 02/10/2019; chest CT-02/04/2019 MEDICATIONS: None. COMPLICATIONS: None immediate. TECHNIQUE: Informed written consent was obtained from the the patient's daughter after a discussion of the risks, benefits and alternatives to treatment. A timeout was performed prior to the initiation of the procedure. Patient was positioned left lateral decubitus in her hospital bed with initial ultrasound scanning demonstrating a recurrent moderate to large sized anechoic right-sided pleural effusion. The inferolateral aspect of the right lower chest was prepped and draped in the usual sterile fashion. 1% lidocaine was used for local anesthesia. An ultrasound image was saved for documentation purposes. An 8 Fr Safe-T-Centesis catheter was introduced. The thoracentesis was performed. The catheter was removed and a dressing was applied. The patient tolerated the procedure well without immediate post procedural complication. The patient was escorted to have an upright chest radiograph. FINDINGS: A total of approximately 1.4 liters of serous fluid was removed. Requested samples were sent to the laboratory. IMPRESSION: Successful ultrasound-guided right sided thoracentesis yielding 1.4 liters of pleural fluid. Electronically Signed   By: Sandi Mariscal M.D.   On: 02/14/2019 11:00   US Thoracentesis Asp Pleural Space W/img Guide  Result Date: 02/07/2019 INDICATION: Right pleural effusion,  hypertension, diabetes EXAM: ULTRASOUND GUIDED RIGHT THORACENTESIS MEDICATIONS: 1% lidocaine local COMPLICATIONS: None immediate. PROCEDURE: An ultrasound guided thoracentesis was thoroughly discussed with the patient and questions answered. The benefits, risks, alternatives and complications were also discussed. The patient understands and wishes to proceed with the procedure. Written consent was obtained. Ultrasound was performed to localize and mark an adequate pocket of fluid in the right chest. The area was then prepped and draped in the normal sterile fashion. 1% Lidocaine was used for local anesthesia. Under ultrasound guidance a 6 Fr Safe-T-Centesis catheter was introduced. Thoracentesis was performed. The catheter was removed and a dressing applied. FINDINGS: A total of approximately 1.2 L of amber colored pleural fluid was removed. Samples were sent to the laboratory as requested by the clinical team. IMPRESSION: Successful ultrasound guided right thoracentesis yielding 1.2 L of pleural fluid. Electronically Signed   By: Jerilynn Mages.  Shick M.D.   On: 02/07/2019 10:10     Assessment and plan  1. Malignant neoplasm of lung, unspecified laterality, unspecified part of lung ([place])   2. Acute alteration in mental status   3. Tachycardia    Patient has acute mental status change, significantly different from her baseline mental status.  Metabolic encephalopathy  versus infectious versus other etiologies. Tachycardia and tachypnea, with irregular heartbeats. I recommend patient to be sent to emergency room for immediate evaluation with labs and images. Discussed with daughter over the phone and she agrees with the plan. Total face to face encounter time for this patient visit was 25 min. >50% of the time was  spent in counseling and coordination of care.    Earlie Server, MD, PhD Hematology Oncology [facility] Pager- [phone] 02/18/2019

## 2019-02-18 NOTE — H&P (Addendum)
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  Patient was recently discharged on 02/14/2019 following admission for acute hypoxic respiratory failure secondary to right-sided pleural effusion.  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  During her hospital stay she was noted to be encephalopathic secondary to hyponatremia, MRI of the brain at that time was negative for stroke or mets.  She was discharged to follow-up with up with oncology Dr. Tasia Catchings for treatment options of her adenocarcinoma.  Per patient's chart, patient had a follow-up visit with his oncology today and was noted to be lethargic and incoherent in the office with blood pressure of 87/55 mm Hg, pulse 106, temp 95.7 and resp 24 therefore she was sent to the ED for further evaluation.  On arrival to the ED, she was afebrile with blood pressure 133/73 mm Hg and pulse rate 99 beats/min. There were no focal neurological deficits; she was awake but with incomprehensible speech.  Per daughter who is currently at the bedside, patient speech is not at baseline usually she is coherent and speaks clearly without any  difficulty.  He has noticed this change in speech with worsening since discharge.  Initial labs revealed sodium 129, potassium 5.2, troponin 53, lactic acid 1.6, WBC 11.3.  UA pending.  Chest x-ray showed progressive right base atelectasis/infiltrate and right-sided pleural effusion.  Noncontrast CT head was obtained and showed no acute intracranial abnormality.  Patient will be admitted for further management.  PAST MEDICAL HISTORY:   Past Medical History:  Diagnosis Date   Diabetes mellitus without complication ([place])    Hypertension     PAST SURGICAL HISTORY:   Past Surgical History:  Procedure Laterality Date   ABDOMINAL HYSTERECTOMY     CHOLECYSTECTOMY      SOCIAL HISTORY:   Social History   Tobacco Use   Smoking status: Former Smoker   Smokeless tobacco: Never Used  Substance Use Topics   Alcohol use: Not Currently    FAMILY HISTORY:  No family history on file.  DRUG ALLERGIES:   Allergies  Allergen Reactions   Codeine Hives   Atorvastatin Other (See Comments)   Citalopram Other (See Comments)    Nausea   Keflex [Cephalexin] Diarrhea   Tylenol [Acetaminophen] Hives    REVIEW OF SYSTEMS:   ROS Unable to obtain from patient due to current altered mental status. MEDICATIONS AT HOME:   Prior to Admission medications   Medication Sig Start Date End Date Taking? Authorizing Provider  albuterol (VENTOLIN HFA) 108 (90 Base) MCG/ACT inhaler Inhale 1 puff into the lungs every 6 (six) hours as needed for wheezing or shortness of breath.   Yes [provider]  Amino Acids-Protein Hydrolys (FEEDING SUPPLEMENT, PRO-STAT SUGAR FREE 64,) LIQD  Take 30 mLs by mouth daily.   Yes [provider]  atenolol (TENORMIN) 50 MG tablet Take 50 mg by mouth daily. 11/22/18  Yes [provider]  Calcium Carb-Cholecalciferol (CALCIUM-VITAMIN D) 500-200 MG-UNIT tablet Take 1 tablet by mouth 2 (two) times a day.   Yes [provider]    Multiple Vitamin (MULTIVITAMIN WITH MINERALS) TABS tablet Take 1 tablet by mouth daily.   Yes [provider]  omeprazole (PRILOSEC) 40 MG capsule Take 40 mg by mouth daily. 30 minutes before food and meds 01/18/19 01/18/20 Yes [provider]  polyethylene glycol (MIRALAX / GLYCOLAX) 17 g packet Take 17 g by mouth daily. 02/15/19  Yes Wieting, Richard, MD  Pyridoxine HCl (VITAMIN B-6) 250 MG tablet Take 500 mg by mouth 2 (two) times daily.   Yes [provider]      VITAL SIGNS:  Blood pressure 126/67, pulse 74, temperature 98.3 F (36.8 C), temperature source Oral, resp. rate 19, weight 108 kg, SpO2 95 %.  PHYSICAL EXAMINATION:   Physical Exam  GENERAL:  78 y.o.-year-old patient lying in the bed with no acute distress.  EYES: Pupils equal, round, reactive to light and accommodation. No scleral icterus. Extraocular muscles intact.  HEENT: Head atraumatic, normocephalic. Oropharynx and nasopharynx clear.  NECK:  Supple, no jugular venous distention. No thyroid enlargement, no tenderness.  LUNGS: Normal breath sounds bilaterally, no wheezing, rales,rhonchi or crepitation. No use of accessory muscles of respiration.  CARDIOVASCULAR: S1, S2 normal. No murmurs, rubs, or gallops.  ABDOMEN: Soft, nontender, nondistended. Bowel sounds present. No organomegaly or mass.  EXTREMITIES: Bilateral pedal edema, cyanosis, or clubbing. No rash or lesions. + pedal pulses MUSCULOSKELETAL: Normal bulk, and power was 5+ grip and elbow, knee, and ankle flexion and extension bilaterally.  NEUROLOGIC:Alert and disoriented x 3. CN 2-12 intact. Sensation to light touch and cold stimuli intact bilaterally. Babinski is downgoing. DTR's (biceps, patellar, and achilles) 2+ and symmetric throughout. Gait not tested due to safety concern. PSYCHIATRIC: The patient is alert and and disoriented x4. SKIN: No obvious rash, lesion, or ulcer.   DATA REVIEWED:  LABORATORY PANEL:   CBC Recent Labs   Lab 02/18/19 0941  WBC 11.3*  HGB 11.0*  HCT 35.6*  PLT 253   ------------------------------------------------------------------------------------------------------------------  Chemistries  Recent Labs  Lab 02/12/19 0645  02/18/19 0941  NA 136   < > 129*  K 4.3   < > 5.2*  CL 92*   < > 83*  CO2 39*   < > 34*  GLUCOSE 128*   < > 152*  BUN 13   < > 48*  CREATININE 0.39*   < > 0.94  CALCIUM 9.2   < > 9.7  MG 1.9  --   --   AST  --   --  37  ALT  --   --  49*  ALKPHOS  --   --  81  BILITOT  --   --  0.8   < > = values in this interval not displayed.   ------------------------------------------------------------------------------------------------------------------  Cardiac Enzymes No results for input(s): TROPONINI in the last 168 hours. ------------------------------------------------------------------------------------------------------------------  RADIOLOGY:  Ct Head Wo Contrast  Result Date: 02/18/2019 CLINICAL DATA:  Altered level of consciousness, unexplained, lethargy, hypotension, diabetes mellitus, hypertension, lung cancer EXAM: CT HEAD WITHOUT CONTRAST TECHNIQUE: Contiguous axial images were obtained from the base of the skull through the vertex without intravenous contrast. Sagittal and coronal MPR images reconstructed from axial data set. COMPARISON:  MR brain 02/07/2019 FINDINGS: Brain: Mild generalized atrophy. Normal ventricular morphology. No midline shift or mass effect.2 dense calcification of falx. No intracranial hemorrhage, mass lesion or evidence of acute infarction. No extra-axial fluid collections. Vascular: No hyperdense vessels Skull: Intact Sinuses/Orbits: Clear Other: N/A IMPRESSION: No acute intracranial abnormalities. Electronically Signed   By: Lavonia Dana M.D.   On: 02/18/2019 11:14   Dg Chest Portable 1 View  Result Date: 02/18/2019 CLINICAL DATA:  Weakness.  Confusion. EXAM: PORTABLE CHEST 1 VIEW COMPARISON:  Weakness and confusion. FINDINGS:  Interval removal of right PICC line. Heart size normal. Pulmonary venous congestion. Progressive right base atelectasis/infiltrate and right-sided pleural effusion. Left base subsegmental atelectasis again noted. No pneumothorax. IMPRESSION: 1.  Interim removal of right PICC line. 2. Progressive right base atelectasis/infiltrate and right-sided pleural effusion. Mild left base subsegmental atelectasis again noted. Electronically Signed   By: Marcello Moores  Register   On: 02/18/2019 09:54    EKG:   EKG: unchanged from previous tracings, atrial fibrillation, rate 127. Vent. rate 127 BPM PR interval * ms QRS duration 80 ms QT/QTc 284/413 ms P-R-T axes * 86 -3  IMPRESSION AND PLAN:   78 y.o. 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  1.  Altered mental status-may be metabolic in the setting of hyponatremia however given speech changes and new onset A. fib may need to rule out ischemic event - Admit to telemetry floor - CT head negative for acute intracranial abnormality - Check MRI brain without contrast - Chest x-rays shows progressive right base atelectasis/infiltrate and right-sided pleural effusion - Check TSH B12 - Blood cultures pending - Urine cultures pending  2. New onset A. fib with RVR -initially with low BP now hemodynamically stable - EKG+Telemetry - TrendTroponins - TSH, FT4 - Recent TTEcho 01/2019 with LVEF 60-65% - Diltiazem 30mg  PO qid while titrating for rate control, then transition to SA daily formulation  - Consider Metoprolol 25-100mg  PO bid (start q6h while titrating dose) if BP allows - Cardiology consult  3. Hyponatremia -likely secondary to SIADH and adenocarcinoma of the pleural space - Recent diagnosis with similar treated  with 3% saline - Nephrology consult may need 3% with volume status monitoring - Follow serial sodium  4. Metastatic adenocarcinoma - Following with Dr. Tasia Catchings - Plan PET scan as outpatient once stable  5. Type 2 diabetes mellitus -Start sliding scale  6. Hypertension -now hypotensive - Hold atenolol   7. Thromboembolism Risk Management - Lovenox per pharmacy protocol pending cardiology evaluation    All the records are reviewed and case discussed with ED provider. Management plans discussed with the patient, family and they are in agreement.  CODE STATUS: FULL  TOTAL TIME TAKING CARE OF THIS PATIENT: 50 minutes.    on 02/18/2019 at 2:31 PM  Rufina Falco, DNP, FNP-BC [facility] Nurse Practitioner Between 7am to 6pm - Pager [phone]  After 6pm go to [email or web address] - password [email or web address] [facility]  Office  [phone]  CC: Primary care physician; Dayton Martes Victoriano Lain, NP

## 2019-02-18 NOTE — ED Provider Notes (Signed)
[facility] Emergency Department Provider Note  ____________________________________________  Time seen: Approximately 12:24 PM  I have reviewed the triage vital signs and the nursing notes.   HISTORY  Chief Complaint Altered Mental Status    Level 5 Caveat: Portions of the History and Physical including HPI and review of systems are unable to be completely obtained due to patient being a poor historian   HPI Cathy Buck is a 78 y.o. female with a history of hypertension diabetes and lung cancer who is sent to the ED from cancer center today due to altered mental status and hypotension.  Patient was recently hospitalized due to shortness of breath and pleural effusion, [facility] after her illness.  Patient is not able to interact follow commands or offer any history at this time.      Past Medical History:  Diagnosis Date  . Diabetes mellitus without complication ([place])   . Hypertension      Patient Active Problem List   Diagnosis Date Noted  . Altered mental status 02/18/2019  . Primary adenocarcinoma of lung ([place])   . Pleural effusion   . Weakness   . Hyponatremia 02/03/2019  . HTN (hypertension) 02/03/2019  . Diabetes ([place]) 02/03/2019     Past Surgical History:  Procedure Laterality Date  . ABDOMINAL HYSTERECTOMY    . CHOLECYSTECTOMY       Prior to Admission medications   Medication Sig Start Date End Date Taking? Authorizing Provider  albuterol (VENTOLIN HFA) 108 (90 Base) MCG/ACT inhaler Inhale 1 puff into the lungs every 6 (six) hours as needed for wheezing or shortness of breath.   Yes [provider]  Amino Acids-Protein Hydrolys (FEEDING SUPPLEMENT, PRO-STAT SUGAR FREE 64,) LIQD Take 30 mLs by mouth daily.   Yes [provider]  atenolol (TENORMIN) 50 MG tablet Take 50 mg by mouth daily. 11/22/18  Yes [provider]  Calcium Carb-Cholecalciferol (CALCIUM-VITAMIN D)  500-200 MG-UNIT tablet Take 1 tablet by mouth 2 (two) times a day.   Yes [provider]  Multiple Vitamin (MULTIVITAMIN WITH MINERALS) TABS tablet Take 1 tablet by mouth daily.   Yes [provider]  omeprazole (PRILOSEC) 40 MG capsule Take 40 mg by mouth daily. 30 minutes before food and meds 01/18/19 01/18/20 Yes [provider]  polyethylene glycol (MIRALAX / GLYCOLAX) 17 g packet Take 17 g by mouth daily. 02/15/19  Yes Wieting, Richard, MD  Pyridoxine HCl (VITAMIN B-6) 250 MG tablet Take 500 mg by mouth 2 (two) times daily.   Yes [provider]     Allergies Codeine, Atorvastatin, Citalopram, Keflex [cephalexin], and Tylenol [acetaminophen]   No family history on file.  Social History Social History   Tobacco Use  . Smoking status: Former Research scientist (life sciences)  . Smokeless tobacco: Never Used  Substance Use Topics  . Alcohol use: Not Currently  . Drug use: Not Currently    Review of Systems Level 5 Caveat: Portions of the History and Physical including HPI and review of systems are unable to be completely obtained due to patient being a poor historian   Constitutional:   No known fever.  ENT:   No rhinorrhea. Cardiovascular:   No chest pain or syncope. Respiratory:   Positive recent shortness of breath . Gastrointestinal:   Negative for abdominal pain, vomiting and diarrhea.  Musculoskeletal:   Negative for focal pain or swelling ____________________________________________   PHYSICAL EXAM:  VITAL SIGNS: ED Triage Vitals  Enc Vitals  Group     BP 02/18/19 0929 133/73     Pulse Rate 02/18/19 1111 62     Resp 02/18/19 0929 20     Temp 02/18/19 0929 98.3 F (36.8 C)     Temp Source 02/18/19 0929 Oral     SpO2 02/18/19 0939 93 %     Weight 02/18/19 0932 238 lb 1.6 oz (108 kg)     Height --      Head Circumference --      [facility] --      Pain Score --      Pain Loc --      Pain Edu? --      Excl. in [place]? --     Vital signs reviewed, nursing  assessments reviewed.   Constitutional:   Awake, not alert, not oriented.  Ill-appearing Eyes:   Conjunctivae are normal. EOMI untestable. PERRL. ENT      Head:   Normocephalic and atraumatic.      Nose:   No congestion/rhinnorhea.       Mouth/Throat:   Dry mucous membranes, no pharyngeal erythema. No peritonsillar mass.       Neck:   No meningismus. Full ROM. Hematological/Lymphatic/Immunilogical:   No cervical lymphadenopathy. Cardiovascular:   RRR, rate of 90. Symmetric bilateral radial and DP pulses.  No murmurs. Cap refill less than 2 seconds. Respiratory:   Tachypnea, bibasilar crackles. Gastrointestinal:   Soft and nontender. Non distended. There is no CVA tenderness.  No rebound, rigidity, or guarding. Musculoskeletal:   Normal range of motion in all extremities. No joint effusions.  No lower extremity tenderness.  No edema. Neurologic:   Mumbling speech.  Not able to follow commands.  Motor grossly intact without obvious lateralizing deficit. Cranial nerves untestable due to patient's inability to participate GCS = [id number] = 10 Skin:    Skin is warm, dry and intact. No rash noted.  No petechiae, purpura, or bullae.  ____________________________________________    LABS (pertinent positives/negatives) (all labs ordered are listed, but only abnormal results are displayed) Labs Reviewed  COMPREHENSIVE METABOLIC PANEL - Abnormal; Notable for the following components:      Result Value   Sodium 129 (*)    Potassium 5.2 (*)    Chloride 83 (*)    CO2 34 (*)    Glucose, Bld 152 (*)    BUN 48 (*)    Albumin 2.9 (*)    ALT 49 (*)    GFR calc non Af Amer 58 (*)    All other components within normal limits  ACETAMINOPHEN LEVEL - Abnormal; Notable for the following components:   Acetaminophen (Tylenol), Serum <10 (*)    All other components within normal limits  CBC WITH DIFFERENTIAL/PLATELET - Abnormal; Notable for the following components:   WBC 11.3 (*)    Hemoglobin 11.0  (*)    HCT 35.6 (*)    MCV 79.5 (*)    MCH 24.6 (*)    Neutro Abs 9.7 (*)    Lymphs Abs 0.6 (*)    Abs Immature Granulocytes 0.62 (*)    All other components within normal limits  TROPONIN I (HIGH SENSITIVITY) - Abnormal; Notable for the following components:   Troponin I (High Sensitivity) 53 (*)    All other components within normal limits  SARS CORONAVIRUS 2 (HOSPITAL ORDER, [place] [facility])  CULTURE, BLOOD (ROUTINE X 2)  CULTURE, BLOOD (ROUTINE X 2)  URINE CULTURE  ETHANOL  LIPASE, BLOOD  SALICYLATE LEVEL  LACTIC ACID, PLASMA  URINALYSIS, COMPLETE (UACMP) WITH MICROSCOPIC  URINE DRUG SCREEN, QUALITATIVE ([facility] ONLY)  TROPONIN I (HIGH SENSITIVITY)   ____________________________________________   EKG  Interpreted by me Sinus rhythm rate of 81, normal axis and intervals.  Normal QRS and ST segments.  Isolated T wave inversion in V3 which is nonspecific.  Repeat EKG interpreted by me, performed at 9:56 AM Atrial fibrillation with rapid ventricular response, rate of 133.  Right axis, normal intervals.  Normal QRS and ST segments.  Persistent mild T wave inversion in V3 which is nonspecific, no evolving ischemic changes.  ____________________________________________    RADIOLOGY  Ct Head Wo Contrast  Result Date: 02/18/2019 CLINICAL DATA:  Altered level of consciousness, unexplained, lethargy, hypotension, diabetes mellitus, hypertension, lung cancer EXAM: CT HEAD WITHOUT CONTRAST TECHNIQUE: Contiguous axial images were obtained from the base of the skull through the vertex without intravenous contrast. Sagittal and coronal MPR images reconstructed from axial data set. COMPARISON:  MR brain 02/07/2019 FINDINGS: Brain: Mild generalized atrophy. Normal ventricular morphology. No midline shift or mass effect.2 dense calcification of falx. No intracranial hemorrhage, mass lesion or evidence of acute infarction. No extra-axial fluid collections. Vascular: No  hyperdense vessels Skull: Intact Sinuses/Orbits: Clear Other: N/A IMPRESSION: No acute intracranial abnormalities. Electronically Signed   By: Lavonia Dana M.D.   On: 02/18/2019 11:14   Dg Chest Portable 1 View  Result Date: 02/18/2019 CLINICAL DATA:  Weakness.  Confusion. EXAM: PORTABLE CHEST 1 VIEW COMPARISON:  Weakness and confusion. FINDINGS: Interval removal of right PICC line. Heart size normal. Pulmonary venous congestion. Progressive right base atelectasis/infiltrate and right-sided pleural effusion. Left base subsegmental atelectasis again noted. No pneumothorax. IMPRESSION: 1.  Interim removal of right PICC line. 2. Progressive right base atelectasis/infiltrate and right-sided pleural effusion. Mild left base subsegmental atelectasis again noted. Electronically Signed   By: Marcello Moores  Register   On: 02/18/2019 09:54    ____________________________________________   PROCEDURES .Critical Care Performed by: Carrie Mew, MD Authorized by: Carrie Mew, MD   Critical care provider statement:    Critical care time (minutes):  35   Critical care time was exclusive of:  Separately billable procedures and treating other patients   Critical care was necessary to treat or prevent imminent or life-threatening deterioration of the following conditions:  CNS failure or compromise and circulatory failure   Critical care was time spent personally by me on the following activities:  Development of treatment plan with patient or surrogate, discussions with consultants, evaluation of patient's response to treatment, examination of patient, obtaining history from patient or surrogate, ordering and performing treatments and interventions, ordering and review of laboratory studies, ordering and review of radiographic studies, pulse oximetry, re-evaluation of patient's condition and review of old charts    ____________________________________________  DIFFERENTIAL DIAGNOSIS   Stroke, intracranial  hemorrhage dehydration, electrolyte abnormality, pulmonary embolism, pulmonary edema, pneumonia, UTI  CLINICAL IMPRESSION / ASSESSMENT AND PLAN / ED COURSE  Pertinent labs & imaging results that were available during my care of the patient were reviewed by me and considered in my medical decision making (see chart for details).   Rechel Delosreyes Rylander was evaluated in Emergency Department on 02/18/2019 for the symptoms described in the history of present illness. She was evaluated in the context of the global COVID-19 pandemic, which necessitated consideration that the patient might be at risk for infection with the SARS-CoV-2 virus that causes COVID-19. Institutional protocols and algorithms that pertain to the evaluation of patients at  risk for COVID-19 are in a state of rapid change based on information released by regulatory bodies including the CDC and federal and state organizations. These policies and algorithms were followed during the patient's care in the ED.     Clinical Course as of Feb 18 1223  Fri Feb 18, 2019  0934 Patient presents with confusion, generalized weakness, hypertension at cancer center.  Here her blood pressure and other vital signs are unremarkable, but she is confused, not able to follow commands, mumbling speech.  No sign of infection and not septic on initial assessment.  Differential is broad requiring extensive lab panel to evaluate for occult sepsis, CT head to evaluate for intracranial hemorrhage or stroke, chest x-ray, reassess.  Will screen for COVID due to likely need for hospitalization today.   [PS]  1204 Cxr shows increased pleural effusion. No clear pneumonia. Labs at baseline so far. Awaiting UA. D/w hospitalist for further mgmt. CT head = NAD.    [PS]    Clinical Course User Index [PS] Carrie Mew, MD     ----------------------------------------- 12:35 PM on 02/18/2019 -----------------------------------------  Blood pressure improved after liter  bolus.  ____________________________________________   FINAL CLINICAL IMPRESSION(S) / ED DIAGNOSES    Final diagnoses:  Altered mental status, unspecified altered mental status type  Pleural effusion  Primary malignant neoplasm of lung metastatic to other site, unspecified laterality [facility])     ED Discharge Orders    None      Portions of this note were generated with dragon dictation software. Dictation errors may occur despite best attempts at proofreading.   Carrie Mew, MD 02/18/19 1235

## 2019-02-18 NOTE — Progress Notes (Signed)
PHARMACY - PHYSICIAN COMMUNICATION CRITICAL VALUE ALERT - BLOOD CULTURE IDENTIFICATION (BCID)  Results for orders placed or performed during the hospital encounter of 02/18/19  Blood Culture ID Panel (Reflexed) (Collected: 02/18/2019  9:41 AM)  Result Value Ref Range   Enterococcus species NOT DETECTED NOT DETECTED   Listeria monocytogenes NOT DETECTED NOT DETECTED   Staphylococcus species NOT DETECTED NOT DETECTED   Staphylococcus aureus (BCID) NOT DETECTED NOT DETECTED   Streptococcus species NOT DETECTED NOT DETECTED   Streptococcus agalactiae NOT DETECTED NOT DETECTED   Streptococcus pneumoniae NOT DETECTED NOT DETECTED   Streptococcus pyogenes NOT DETECTED NOT DETECTED   Acinetobacter baumannii NOT DETECTED NOT DETECTED   Enterobacteriaceae species DETECTED (A) NOT DETECTED   Enterobacter cloacae complex NOT DETECTED NOT DETECTED   Escherichia coli NOT DETECTED NOT DETECTED   Klebsiella oxytoca NOT DETECTED NOT DETECTED   Klebsiella pneumoniae NOT DETECTED NOT DETECTED   Proteus species DETECTED (A) NOT DETECTED   Serratia marcescens NOT DETECTED NOT DETECTED   Carbapenem resistance NOT DETECTED NOT DETECTED   Haemophilus influenzae NOT DETECTED NOT DETECTED   Neisseria meningitidis NOT DETECTED NOT DETECTED   Pseudomonas aeruginosa NOT DETECTED NOT DETECTED   Candida albicans NOT DETECTED NOT DETECTED   Candida glabrata NOT DETECTED NOT DETECTED   Candida krusei NOT DETECTED NOT DETECTED   Candida parapsilosis NOT DETECTED NOT DETECTED   Candida tropicalis NOT DETECTED NOT DETECTED    Name of physician (or Provider) Contacted:   Rufina Falco , NP   Changes to prescribed antibiotics required:   Will start Ceftriaxone 2 gm IV Q24H.   Navneet Schmuck D 02/18/2019  9:18 PM

## 2019-02-18 NOTE — Consult Note (Signed)
ANTICOAGULATION CONSULT NOTE - Initial Consult  Pharmacy Consult for Enoxaparin Indication: atrial fibrillation  Allergies  Allergen Reactions  . Codeine Hives  . Atorvastatin Other (See Comments)  . Citalopram Other (See Comments)    Nausea  . Keflex [Cephalexin] Diarrhea  . Tylenol [Acetaminophen] Hives    Patient Measurements: Weight: 238 lb 1.6 oz (108 kg)  Vital Signs: Temp: 98.3 F (36.8 C) (08/07 0929) Temp Source: Oral (08/07 0929) BP: 96/63 (08/07 1200) Pulse Rate: 85 (08/07 1200)  Labs: Recent Labs    02/18/19 0941  HGB 11.0*  HCT 35.6*  PLT 253  CREATININE 0.94  TROPONINIHS 53*    Estimated Creatinine Clearance: 59.2 mL/min (by C-G formula based on SCr of 0.94 mg/dL).   Medical History: Past Medical History:  Diagnosis Date  . Diabetes mellitus without complication ([place])   . Hypertension     Medications:  (Not in a hospital admission)  Scheduled:  . [START ON 02/19/2019] atenolol  50 mg Oral Daily  . [START ON 02/19/2019] calcium-vitamin D  1 tablet Oral Q breakfast  . enoxaparin (LOVENOX) injection  40 mg Subcutaneous Q24H  . feeding supplement (PRO-STAT SUGAR FREE 64)  30 mL Oral Daily  . [START ON 02/19/2019] multivitamin with minerals  1 tablet Oral Daily  . [START ON 02/19/2019] pantoprazole  40 mg Oral Daily  . [START ON 02/19/2019] polyethylene glycol  17 g Oral Daily  . vitamin B-6  500 mg Oral BID   Infusions:   PRN: ipratropium-albuterol Anti-infectives (From admission, onward)   None      Assessment: Pharmacy consulted to start enoxaparin for afib. No DOAC PTA.   Goal of Therapy:  Monitor platelets by anticoagulation protocol: Yes   Plan:  Lovenox 100 mg q12H.   Oswald Hillock, PharmD, BCPS 02/18/2019,12:13 PM

## 2019-02-18 NOTE — Progress Notes (Signed)
PT Cancellation Note  Patient Details Name: Sarya Linenberger Lantier MRN: [id number] DOB: 16-Apr-1941   Cancelled Treatment:    Reason Eval/Treat Not Completed: Other (comment)(PT consult received, chart reviewed and spoke with RN. Pt with AMS/lethargy as well as elevated K+. Per PT practice guidelines PT to hold evaluation until patient is more medically appropriate and able to participate with therapy.)  Lieutenant Diego PT, DPT 2:11 PM,02/18/19 [phone]

## 2019-02-18 NOTE — ED Notes (Signed)
ED TO INPATIENT HANDOFF REPORT  ED Nurse Name and Phone #: Anderson [place] [phone]  S Name/Age/Gender Cathy Buck 78 y.o. female Room/Bed: ED25A/ED25A  Code Status   Code Status: Full Code  Home/SNF/Other Skilled nursing facility Patient oriented to: self Is this baseline? No   Triage Complete: Triage complete  Chief Complaint AMS  Triage Note Pt to ER via wheelchair from [place] from [facility].  Pt was admitted recently and discharged to [facility].  Pt today is lethargic and reports from CC of extreme hypotension.  Pt arrives with 2L [place] from home tank.   Allergies Allergies  Allergen Reactions  . Codeine Hives  . Atorvastatin Other (See Comments)  . Citalopram Other (See Comments)    Nausea  . Keflex [Cephalexin] Diarrhea  . Tylenol [Acetaminophen] Hives    Level of Care/Admitting Diagnosis ED Disposition    ED Disposition Condition [facility] Area: [place] [[id number]]  Level of Care: Telemetry [5]  Covid Evaluation: Person Under Investigation (PUI)  Diagnosis: Altered mental status [780.97.ICD-9-CM]  Admitting Physician: Rufina Falco [place]  Attending Physician: Rufina Falco ACHIENG [[id number]]  Estimated length of stay: past midnight tomorrow  Certification:: I certify this patient will need inpatient services for at least 2 midnights  PT Class (Do Not Modify): Inpatient [101]  PT Acc Code (Do Not Modify): Private [1]       B Medical/Surgery History Past Medical History:  Diagnosis Date  . Diabetes mellitus without complication ([place])   . Hypertension    Past Surgical History:  Procedure Laterality Date  . ABDOMINAL HYSTERECTOMY    . CHOLECYSTECTOMY       A IV Location/Drains/Wounds Patient Lines/Drains/Airways Status   Active Line/Drains/Airways    Name:   Placement date:   Placement time:   Site:   Days:   Peripheral IV 02/18/19 Left Antecubital   02/18/19    0939    Antecubital    less than 1          Intake/Output Last 24 hours  Intake/Output Summary (Last 24 hours) at 02/18/2019 1216 Last data filed at 02/18/2019 1017 Gross per 24 hour  Intake 500 ml  Output -  Net 500 ml    Labs/Imaging Results for orders placed or performed during the hospital encounter of 02/18/19 (from the past 48 hour(s))  Comprehensive metabolic panel     Status: Abnormal   Collection Time: 02/18/19  9:41 AM  Result Value Ref Range   Sodium 129 (L) 135 - 145 mmol/L   Potassium 5.2 (H) 3.5 - 5.1 mmol/L   Chloride 83 (L) 98 - 111 mmol/L   CO2 34 (H) 22 - 32 mmol/L   Glucose, Bld 152 (H) 70 - 99 mg/dL   BUN 48 (H) 8 - 23 mg/dL   Creatinine, Ser 0.94 0.44 - 1.00 mg/dL   Calcium 9.7 8.9 - 10.3 mg/dL   Total Protein 6.8 6.5 - 8.1 g/dL   Albumin 2.9 (L) 3.5 - 5.0 g/dL   AST 37 15 - 41 U/L   ALT 49 (H) 0 - 44 U/L   Alkaline Phosphatase 81 38 - 126 U/L   Total Bilirubin 0.8 0.3 - 1.2 mg/dL   GFR calc non Af Amer 58 (L) >60 mL/min   GFR calc Af Amer >60 >60 mL/min   Anion gap 12 5 - 15    Comment: Performed at [facility], [address] [place], [place] [address]  Acetaminophen  level     Status: Abnormal   Collection Time: 02/18/19  9:41 AM  Result Value Ref Range   Acetaminophen (Tylenol), Serum <10 (L) 10 - 30 ug/mL    Comment: (NOTE) Therapeutic concentrations vary significantly. A range of 10-30 ug/mL  may be an effective concentration for many patients. However, some  are best treated at concentrations outside of this range. Acetaminophen concentrations >150 ug/mL at 4 hours after ingestion  and >50 ug/mL at 12 hours after ingestion are often associated with  toxic reactions. Performed at [facility], [address] [place], [place] [address]   Ethanol     Status: None   Collection Time: 02/18/19  9:41 AM  Result Value Ref Range   Alcohol, Ethyl (B) <10 <10 mg/dL    Comment: (NOTE) Lowest detectable limit for serum alcohol is 10 mg/dL. For  medical purposes only. Performed at [facility], [address] [place], [place] [address]   Lipase, blood     Status: None   Collection Time: 02/18/19  9:41 AM  Result Value Ref Range   Lipase 25 11 - 51 U/L    Comment: Performed at [facility], [address] [place], [place] [address]  Salicylate level     Status: None   Collection Time: 02/18/19  9:41 AM  Result Value Ref Range   Salicylate Lvl <6.5 2.8 - 30.0 mg/dL    Comment: Performed at [facility], [place], [place] [address]  Troponin I (High Sensitivity)     Status: Abnormal   Collection Time: 02/18/19  9:41 AM  Result Value Ref Range   Troponin I (High Sensitivity) 53 (H) <18 ng/L    Comment: (NOTE) Elevated high sensitivity troponin I (hsTnI) values and significant  changes across serial measurements may suggest ACS but many other  chronic and acute conditions are known to elevate hsTnI results.  Refer to the "Links" section for chest pain algorithms and additional  guidance. Performed at [facility], [address] [place], [place] [address]   Lactic acid, plasma     Status: None   Collection Time: 02/18/19  9:41 AM  Result Value Ref Range   Lactic Acid, Venous 1.6 0.5 - 1.9 mmol/L    Comment: Performed at [facility], [address] [place], [place] [address]  CBC with Differential     Status: Abnormal   Collection Time: 02/18/19  9:41 AM  Result Value Ref Range   WBC 11.3 (H) 4.0 - 10.5 K/uL   RBC 4.48 3.87 - 5.11 MIL/uL   Hemoglobin 11.0 (L) 12.0 - 15.0 g/dL   HCT 35.6 (L) 36.0 - 46.0 %   MCV 79.5 (L) 80.0 - 100.0 fL   MCH 24.6 (L) 26.0 - 34.0 pg   MCHC 30.9 30.0 - 36.0 g/dL   RDW 15.1 11.5 - 15.5 %   Platelets 253 150 - 400 K/uL   nRBC 0.2 0.0 - 0.2 %   Neutrophils Relative % 85 %   Neutro Abs 9.7 (H) 1.7 - 7.7 K/uL   Lymphocytes Relative 5 %   Lymphs Abs 0.6 (L) 0.7 - 4.0 K/uL   Monocytes Relative 3 %   Monocytes Absolute 0.3  0.1 - 1.0 K/uL   Eosinophils Relative 0 %   Eosinophils Absolute 0.0 0.0 - 0.5 K/uL   Basophils Relative 1 %   Basophils Absolute 0.1 0.0 - 0.1 K/uL   WBC Morphology MILD LEFT SHIFT (1-5% METAS,  OCC MYELO, OCC BANDS)    Smear Review Normal platelet morphology    Immature Granulocytes 6 %   Abs Immature Granulocytes 0.62 (H) 0.00 - 0.07 K/uL   Polychromasia PRESENT     Comment: Performed at [facility], [address] [place], [place] [address]  SARS Coronavirus 2 [facility] order, Performed in [facility] hospital lab) Nasopharyngeal Nasopharyngeal Swab     Status: None   Collection Time: 02/18/19 10:18 AM   Specimen: Nasopharyngeal Swab  Result Value Ref Range   SARS Coronavirus 2 NEGATIVE NEGATIVE    Comment: (NOTE) If result is NEGATIVE SARS-CoV-2 target nucleic acids are NOT DETECTED. The SARS-CoV-2 RNA is generally detectable in upper and lower  respiratory specimens during the acute phase of infection. The lowest  concentration of SARS-CoV-2 viral copies this assay can detect is 250  copies / mL. A negative result does not preclude SARS-CoV-2 infection  and should not be used as the sole basis for treatment or other  patient management decisions.  A negative result may occur with  improper specimen collection / handling, submission of specimen other  than nasopharyngeal swab, presence of viral mutation(s) within the  areas targeted by this assay, and inadequate number of viral copies  (<250 copies / mL). A negative result must be combined with clinical  observations, patient history, and epidemiological information. If result is POSITIVE SARS-CoV-2 target nucleic acids are DETECTED. The SARS-CoV-2 RNA is generally detectable in upper and lower  respiratory specimens dur ing the acute phase of infection.  Positive  results are indicative of active infection with SARS-CoV-2.  Clinical  correlation with patient history and other diagnostic information is  necessary  to determine patient infection status.  Positive results do  not rule out bacterial infection or co-infection with other viruses. If result is PRESUMPTIVE POSTIVE SARS-CoV-2 nucleic acids MAY BE PRESENT.   A presumptive positive result was obtained on the submitted specimen  and confirmed on repeat testing.  While 2019 novel coronavirus  (SARS-CoV-2) nucleic acids may be present in the submitted sample  additional confirmatory testing may be necessary for epidemiological  and / or clinical management purposes  to differentiate between  SARS-CoV-2 and other Sarbecovirus currently known to infect humans.  If clinically indicated additional testing with an alternate test  methodology [id number]) is advised. The SARS-CoV-2 RNA is generally  detectable in upper and lower respiratory sp ecimens during the acute  phase of infection. The expected result is Negative. Fact Sheet for Patients:  StrictlyIdeas.no Fact Sheet for Healthcare Providers: BankingDealers.co.za This test is not yet approved or cleared by the [place] FDA and has been authorized for detection and/or diagnosis of SARS-CoV-2 by FDA under an Emergency Use Authorization (EUA).  This EUA will remain in effect (meaning this test can be used) for the duration of the COVID-19 declaration under Section 564(b)(1) of the Act, 21 U.S.C. section 360bbb-3(b)(1), unless the authorization is terminated or revoked sooner. Performed at [facility], [address] [place], [place] [address]    Ct Head Wo Contrast  Result Date: 02/18/2019 CLINICAL DATA:  Altered level of consciousness, unexplained, lethargy, hypotension, diabetes mellitus, hypertension, lung cancer EXAM: CT HEAD WITHOUT CONTRAST TECHNIQUE: Contiguous axial images were obtained from the base of the skull through the vertex without intravenous contrast. Sagittal and coronal MPR images reconstructed from axial data set.  COMPARISON:  MR brain 02/07/2019 FINDINGS: Brain: Mild generalized atrophy. Normal ventricular morphology. No midline shift or mass effect.2 dense calcification  of falx. No intracranial hemorrhage, mass lesion or evidence of acute infarction. No extra-axial fluid collections. Vascular: No hyperdense vessels Skull: Intact Sinuses/Orbits: Clear Other: N/A IMPRESSION: No acute intracranial abnormalities. Electronically Signed   By: Lavonia Dana M.D.   On: 02/18/2019 11:14   Dg Chest Portable 1 View  Result Date: 02/18/2019 CLINICAL DATA:  Weakness.  Confusion. EXAM: PORTABLE CHEST 1 VIEW COMPARISON:  Weakness and confusion. FINDINGS: Interval removal of right PICC line. Heart size normal. Pulmonary venous congestion. Progressive right base atelectasis/infiltrate and right-sided pleural effusion. Left base subsegmental atelectasis again noted. No pneumothorax. IMPRESSION: 1.  Interim removal of right PICC line. 2. Progressive right base atelectasis/infiltrate and right-sided pleural effusion. Mild left base subsegmental atelectasis again noted. Electronically Signed   By: Marcello Moores  Register   On: 02/18/2019 09:54    Pending Labs Unresulted Labs (From admission, onward)    Start     Ordered   02/18/19 0934  Culture, blood (routine x 2)  BLOOD CULTURE X 2,   STAT     02/18/19 0934   02/18/19 0934  Urinalysis, Complete w Microscopic  ONCE - STAT,   STAT     02/18/19 0934   02/18/19 0934  Urine culture  Once,   STAT     02/18/19 0934   02/18/19 0934  Urine Drug Screen, Qualitative  Once,   STAT     02/18/19 0934          Vitals/Pain Today's Vitals   02/18/19 1114 02/18/19 1130 02/18/19 1135 02/18/19 1200  BP:  104/70  96/63  Pulse: (!) 139  88 85  Resp: (!) 33 (!) 30 20 (!) 23  Temp:      TempSrc:      SpO2: 93%  100% 96%  Weight:        Isolation Precautions Airborne and Contact precautions  Medications Medications  atenolol (TENORMIN) tablet 50 mg (has no administration in time range)   pantoprazole (PROTONIX) EC tablet 40 mg (has no administration in time range)  polyethylene glycol (MIRALAX / GLYCOLAX) packet 17 g (has no administration in time range)  feeding supplement (PRO-STAT SUGAR FREE 64) liquid 30 mL (has no administration in time range)  multivitamin with minerals tablet 1 tablet (has no administration in time range)  pyridOXINE (VITAMIN B-6) tablet 500 mg (has no administration in time range)  ipratropium-albuterol (DUONEB) 0.5-2.5 (3) MG/3ML nebulizer solution 3 mL (has no administration in time range)  calcium-vitamin D (OSCAL WITH D) 500-200 MG-UNIT per tablet 1 tablet (has no administration in time range)  enoxaparin (LOVENOX) injection 100 mg (has no administration in time range)  sodium chloride 0.9 % bolus 500 mL (0 mLs Intravenous Stopped 02/18/19 1017)    Mobility walks with device High fall risk   Focused Assessments Neuro Assessment Handoff:   Cardiac Rhythm: Atrial fibrillation       Neuro Assessment: Exceptions to WDL Neuro Checks:      Last Documented NIHSS Modified Score:   Has TPA been given? No If patient is a Neuro Trauma and patient is going to OR before floor call report to [place] nurse: [phone] or [phone]     R Recommendations: See Admitting Provider Note  Report given to:   Additional Notes: Pt has converted to NSR, currently non-ambulatory

## 2019-02-18 NOTE — ED Triage Notes (Signed)
Pt to ER via wheelchair from [place] from [facility].  Pt was admitted recently and discharged to [facility].  Pt today is lethargic and reports from CC of extreme hypotension.  Pt arrives with 2L [place] from home tank.

## 2019-02-18 NOTE — ED Notes (Signed)
Pt difficult stick. Lab at the bedside

## 2019-02-18 NOTE — Progress Notes (Signed)
Patient comes in for hospital f/u and is not able to communicate clearly.  Not able to perform the initial nursing assessment workup due to patient being incoherent in office.  Dr. Tasia Catchings informed and advised patient go to ED.  I transported patient to ED.

## 2019-02-18 NOTE — Progress Notes (Signed)
OT Cancellation Note  Patient Details Name: Cathy Buck Date MRN: [id number] DOB: May 26, 1941   Cancelled Treatment:    Reason Eval/Treat Not Completed: Other (comment) Thank you for the OT consult. Order received and chart reviewed. Pt with AMS/lethargy as well as elevated K+. Per OT practice guidelines OT to hold evaluation until patient is more medically appropriate and able to participate with therapy.   Shara Blazing, M.S., OTR/L [place]: [phone] 02/18/19, 3:05 PM

## 2019-02-19 ENCOUNTER — Inpatient Hospital Stay: Payer: Medicare Other

## 2019-02-19 DIAGNOSIS — C3490 Malignant neoplasm of unspecified part of unspecified bronchus or lung: Secondary | ICD-10-CM

## 2019-02-19 DIAGNOSIS — J9601 Acute respiratory failure with hypoxia: Secondary | ICD-10-CM

## 2019-02-19 LAB — URINE DRUG SCREEN, QUALITATIVE (ARMC ONLY)
Amphetamines, Ur Screen: NOT DETECTED
Barbiturates, Ur Screen: NOT DETECTED
Benzodiazepine, Ur Scrn: NOT DETECTED
Cannabinoid 50 Ng, Ur ~~LOC~~: NOT DETECTED
Cocaine Metabolite,Ur ~~LOC~~: NOT DETECTED
MDMA (Ecstasy)Ur Screen: NOT DETECTED
Methadone Scn, Ur: NOT DETECTED
Opiate, Ur Screen: NOT DETECTED
Phencyclidine (PCP) Ur S: NOT DETECTED
Tricyclic, Ur Screen: NOT DETECTED

## 2019-02-19 LAB — BASIC METABOLIC PANEL
Anion gap: 13 (ref 5–15)
BUN: 53 mg/dL — ABNORMAL HIGH (ref 8–23)
CO2: 29 mmol/L (ref 22–32)
Calcium: 9.4 mg/dL (ref 8.9–10.3)
Chloride: 89 mmol/L — ABNORMAL LOW (ref 98–111)
Creatinine, Ser: 0.79 mg/dL (ref 0.44–1.00)
GFR calc Af Amer: >60 mL/min (ref >60)
GFR calc non Af Amer: >60 mL/min (ref >60)
Glucose, Bld: 114 mg/dL — ABNORMAL HIGH (ref 70–99)
Potassium: 5.7 mmol/L — ABNORMAL HIGH (ref 3.5–5.1)
Sodium: 131 mmol/L — ABNORMAL LOW (ref 135–145)

## 2019-02-19 LAB — URINALYSIS, COMPLETE (UACMP) WITH MICROSCOPIC
Bilirubin Urine: NEGATIVE
Glucose, UA: NEGATIVE mg/dL
Ketones, ur: NEGATIVE mg/dL
Nitrite: NEGATIVE
Protein, ur: 100 mg/dL — AB
Specific Gravity, Urine: 1.016 (ref 1.005–1.030)
WBC, UA: >50 WBC/hpf — ABNORMAL HIGH (ref 0–5)
pH: 8 (ref 5.0–8.0)

## 2019-02-19 LAB — BLOOD GAS, ARTERIAL
Acid-Base Excess: 15.8 mmol/L — ABNORMAL HIGH (ref 0.0–2.0)
Bicarbonate: 45.1 mmol/L — ABNORMAL HIGH (ref 20.0–28.0)
FIO2: 0.44
O2 Saturation: 95.5 %
Patient temperature: 37
pCO2 arterial: 78 mmHg (ref 32.0–48.0)
pH, Arterial: 7.37 (ref 7.350–7.450)
pO2, Arterial: 81 mmHg — ABNORMAL LOW (ref 83.0–108.0)

## 2019-02-19 LAB — GLUCOSE, CAPILLARY
Glucose-Capillary: 120 mg/dL — ABNORMAL HIGH (ref 70–99)
Glucose-Capillary: 148 mg/dL — ABNORMAL HIGH (ref 70–99)
Glucose-Capillary: 148 mg/dL — ABNORMAL HIGH (ref 70–99)

## 2019-02-19 LAB — POTASSIUM: Potassium: 4.2 mmol/L (ref 3.5–5.1)

## 2019-02-19 MED ORDER — BUDESONIDE 0.5 MG/2ML IN SUSP
0.5000 mg | Freq: Two times a day (BID) | RESPIRATORY_TRACT | Status: DC
  Administered 2019-02-19 – 2019-03-01 (×19): 0.5 mg via RESPIRATORY_TRACT
  Filled 2019-02-19 (×19): qty 2

## 2019-02-19 MED ORDER — SODIUM CHLORIDE 0.9 % IV BOLUS
500.0000 mL | Freq: Once | INTRAVENOUS | Status: AC
  Administered 2019-02-19: 500 mL via INTRAVENOUS

## 2019-02-19 MED ORDER — METHYLPREDNISOLONE SODIUM SUCC 40 MG IJ SOLR
40.0000 mg | Freq: Two times a day (BID) | INTRAMUSCULAR | Status: DC
  Administered 2019-02-19 – 2019-02-24 (×11): 40 mg via INTRAVENOUS
  Filled 2019-02-19 (×10): qty 1

## 2019-02-19 MED ORDER — AMIODARONE HCL IN DEXTROSE 360-4.14 MG/200ML-% IV SOLN
30.0000 mg/h | INTRAVENOUS | Status: DC
  Administered 2019-02-20 (×2): 30 mg/h via INTRAVENOUS
  Filled 2019-02-19 (×2): qty 200

## 2019-02-19 MED ORDER — SODIUM CHLORIDE 0.9 % IV BOLUS
250.0000 mL | Freq: Once | INTRAVENOUS | Status: AC
  Administered 2019-02-19: 250 mL via INTRAVENOUS

## 2019-02-19 MED ORDER — AMIODARONE HCL IN DEXTROSE 360-4.14 MG/200ML-% IV SOLN
60.0000 mg/h | INTRAVENOUS | Status: AC
  Administered 2019-02-19 (×2): 60 mg/h via INTRAVENOUS
  Filled 2019-02-19 (×2): qty 200

## 2019-02-19 MED ORDER — SODIUM POLYSTYRENE SULFONATE 15 GM/60ML PO SUSP
30.0000 g | Freq: Once | ORAL | Status: AC
  Administered 2019-02-19: 30 g via ORAL
  Filled 2019-02-19: qty 120

## 2019-02-19 MED ORDER — MORPHINE SULFATE (PF) 2 MG/ML IV SOLN
2.0000 mg | INTRAVENOUS | Status: DC | PRN
  Administered 2019-02-19: 2 mg via INTRAVENOUS
  Filled 2019-02-19: qty 1

## 2019-02-19 MED ORDER — FUROSEMIDE 10 MG/ML IJ SOLN
80.0000 mg | Freq: Once | INTRAMUSCULAR | Status: AC
  Administered 2019-02-19: 80 mg via INTRAVENOUS
  Filled 2019-02-19: qty 8

## 2019-02-19 MED ORDER — SODIUM ZIRCONIUM CYCLOSILICATE 5 G PO PACK
10.0000 g | PACK | Freq: Every day | ORAL | Status: DC
  Filled 2019-02-19: qty 1

## 2019-02-19 MED ORDER — IPRATROPIUM-ALBUTEROL 0.5-2.5 (3) MG/3ML IN SOLN
3.0000 mL | RESPIRATORY_TRACT | Status: DC
  Administered 2019-02-19 – 2019-02-22 (×13): 3 mL via RESPIRATORY_TRACT
  Filled 2019-02-19 (×15): qty 3

## 2019-02-19 NOTE — Progress Notes (Signed)
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 Previous admission few days ago, patient had severe hyponatremia, treated with 3% saline This time she presented with hypotension  Low sodium of 129, improved to 131 this morning Potassium 5.7 Patient is confused and delirious.  She is not able to provide any meaningful information.  Objective:  Vital signs in last 24 hours:  Temp:  [97.8 F (36.6 C)-98.6 F (37 C)] 98.4 F (36.9 C) (08/08 0741) Pulse Rate:  [49-145] 66 (08/08 0741) Resp:  [17-33] 18 (08/08 0741) BP: (95-127)/(57-99) 112/70 (08/08 0741) SpO2:  [80 %-100 %] 91 % (08/08 0741)  Weight change:  Filed Weights   02/18/19 0932  Weight: 108 kg    Intake/Output:    Intake/Output Summary (Last 24 hours) at 02/19/2019 0949 Last data filed at 02/19/2019 0500 Gross per 24 hour  Intake 1341.55 ml  Output 450 ml  Net 891.55 ml     Physical Exam: General: No acute distress, lying in the bed  HEENT Moist oral mucous membranes  Neck Supple  Pulm/lungs Coarse breath sounds at bases  CVS/Heart Irregular rhythm  Abdomen:  Soft  Extremities: Trace edema  Neurologic: Alert, disoriented  Skin: Warm    Basic Metabolic Panel:  Recent Labs  Lab 02/13/19 0506 02/14/19 0558 02/18/19 0941 02/19/19 0629  NA 134* 132* 129* 131*  K 4.6 4.8 5.2* 5.7*  CL 91* 90* 83* 89*  CO2 38* 38* 34* 29  GLUCOSE 131* 127* 152* 114*  BUN 16 19 48* 53*  CREATININE 0.42* 0.74 0.94 0.79  CALCIUM 9.4 9.0 9.7 9.4     CBC: Recent Labs  Lab 02/18/19 0941  WBC 11.3*  NEUTROABS 9.7*  HGB 11.0*  HCT 35.6*  MCV 79.5*  PLT 253     No results found for: HEPBSAG,  HEPBSAB, HEPBIGM    Microbiology:  Recent Results (from the past 240 hour(s))  Novel Coronavirus, NAA (hospital order; send-out to ref lab)     Status: None   Collection Time: 02/11/19  5:15 PM   Specimen: Nasopharyngeal Swab; Respiratory  Result Value Ref Range Status   SARS-CoV-2, NAA NOT DETECTED NOT DETECTED Final    Comment: (NOTE) This test was developed and its performance characteristics determined by [employer]. This test has not been FDA cleared or approved. This test has been authorized by FDA under an Emergency Use Authorization (EUA). This test is only authorized for the duration of time the declaration that circumstances exist justifying the authorization of the emergency use of in vitro diagnostic tests for detection of SARS-CoV-2 virus and/or diagnosis of COVID-19 infection under section 564(b)(1) of the Act, 21 U.S.C. 382NKN-3(Z)(7), unless the authorization is terminated or revoked sooner. When diagnostic testing is negative, the possibility of a false negative result should be considered in the context of a patient's recent exposures and the presence of clinical signs and symptoms consistent with COVID-19. An individual without symptoms of COVID-19 and who is not shedding SARS-CoV-2 virus would expect to have a negative (not detected) result in this assay. Performed  At: [facility] [place], [place] [id number] Rush Farmer MD KW:[id number]    [place]  Final    Comment: Performed at [facility], [place],  [place], [place] [address]  Body fluid culture     Status: None   Collection Time: 02/14/19 10:30 AM   Specimen: [facility] Cytology Pleural fluid  Result Value Ref Range Status   Specimen Description   Final    PLEURAL Performed at [facility], [address] [place], [place] [address]    Special Requests   Final    NONE Performed at [facility], [address] [place],  [address]    Gram Stain   Final    RARE WBC PRESENT, PREDOMINANTLY MONONUCLEAR NO ORGANISMS SEEN    Culture   Final    NO GROWTH 3 DAYS Performed at [facility], [place] [address] [place],  [address]    Report Status 02/17/2019 FINAL  Final  SARS Coronavirus 2 [facility] order, Performed in [facility] hospital lab) Nasopharyngeal Nasopharyngeal Swab     Status: None   Collection Time: 02/14/19  1:17 PM   Specimen: Nasopharyngeal Swab  Result Value Ref Range Status   SARS Coronavirus 2 NEGATIVE NEGATIVE Final    Comment: (NOTE) If result is NEGATIVE SARS-CoV-2 target nucleic acids are NOT DETECTED. The SARS-CoV-2 RNA is generally detectable in upper and lower  respiratory specimens during the acute phase of infection. The lowest  concentration of SARS-CoV-2 viral copies this assay can detect is 250  copies / mL. A negative result does not preclude SARS-CoV-2 infection  and should not be used as the sole basis for treatment or other  patient management decisions.  A negative result may occur with  improper specimen collection / handling, submission of specimen other  than nasopharyngeal swab, presence of viral mutation(s) within the  areas targeted by this assay, and inadequate number of viral copies  (<250 copies / mL). A negative result must be combined with clinical  observations, patient history, and epidemiological information. If result is POSITIVE SARS-CoV-2 target nucleic acids are DETECTED. The SARS-CoV-2 RNA is generally detectable in upper and lower  respiratory specimens dur ing the acute phase of infection.  Positive  results are indicative of active infection with SARS-CoV-2.  Clinical  correlation with patient history and other diagnostic information is  necessary to determine patient infection status.  Positive results do  not rule out bacterial infection or co-infection with other viruses. If result is PRESUMPTIVE  POSTIVE SARS-CoV-2 nucleic acids MAY BE PRESENT.   A presumptive positive result was obtained on the submitted specimen  and confirmed on repeat testing.  While 2019 novel coronavirus  (SARS-CoV-2) nucleic acids may be present in the submitted sample  additional confirmatory testing may be necessary for epidemiological  and / or clinical management purposes  to differentiate between  SARS-CoV-2 and other Sarbecovirus currently known to infect humans.  If clinically indicated additional testing with an alternate test  methodology [phone]) is advised. The SARS-CoV-2 RNA is generally  detectable in upper and lower respiratory sp ecimens during the acute  phase of infection. The expected result is Negative. Fact Sheet for Patients:  StrictlyIdeas.no Fact Sheet for Healthcare Providers: BankingDealers.co.za This test is not yet approved or cleared by the [place] FDA and has been authorized for detection and/or diagnosis of SARS-CoV-2 by FDA under an Emergency Use Authorization (EUA).  This EUA will remain in effect (meaning this test can be used) for the duration of the COVID-19 declaration under Section 564(b)(1) of the Act, 21 U.S.C. section 360bbb-3(b)(1), unless the authorization is terminated or revoked sooner. Performed at [facility],  [place], [place] [address]   Culture, blood (routine x 2)     Status: None (Preliminary result)   Collection Time: 02/18/19  9:41 AM   Specimen: BLOOD  Result Value Ref Range Status   Specimen Description BLOOD LEFT ANTECUBITAL  Final   Special Requests   Final    BOTTLES DRAWN AEROBIC AND ANAEROBIC Blood Culture results may not be optimal due to an excessive volume of blood received in culture bottles   Culture  Setup Time   Final    Organism ID to follow GRAM NEGATIVE RODS IN BOTH AEROBIC AND ANAEROBIC BOTTLES CRITICAL RESULT CALLED TO, READ BACK BY AND VERIFIED  WITH: JASON ROBBINS @ 2107 ON 02/18/2019 BY CAF Performed at [facility], [address] [place], [place] [address]    Culture GRAM NEGATIVE RODS  Final   Report Status PENDING  Incomplete  Blood Culture ID Panel (Reflexed)     Status: Abnormal   Collection Time: 02/18/19  9:41 AM  Result Value Ref Range Status   Enterococcus species NOT DETECTED NOT DETECTED Final   Listeria monocytogenes NOT DETECTED NOT DETECTED Final   Staphylococcus species NOT DETECTED NOT DETECTED Final   Staphylococcus aureus (BCID) NOT DETECTED NOT DETECTED Final   Streptococcus species NOT DETECTED NOT DETECTED Final   Streptococcus agalactiae NOT DETECTED NOT DETECTED Final   Streptococcus pneumoniae NOT DETECTED NOT DETECTED Final   Streptococcus pyogenes NOT DETECTED NOT DETECTED Final   Acinetobacter baumannii NOT DETECTED NOT DETECTED Final   Enterobacteriaceae species DETECTED (A) NOT DETECTED Final    Comment: Enterobacteriaceae represent a large family of gram-negative bacteria, not a single organism. CRITICAL RESULT CALLED TO, READ BACK BY AND VERIFIED WITH: JASON ROBBINS @ 2107 ON 02/18/2019 BY CAF    Enterobacter cloacae complex NOT DETECTED NOT DETECTED Final   Escherichia coli NOT DETECTED NOT DETECTED Final   Klebsiella oxytoca NOT DETECTED NOT DETECTED Final   Klebsiella pneumoniae NOT DETECTED NOT DETECTED Final   Proteus species DETECTED (A) NOT DETECTED Final    Comment: CRITICAL RESULT CALLED TO, READ BACK BY AND VERIFIED WITH: JASON ROBBINS @ 2107 ON 02/18/2019 BY CAF    Serratia marcescens NOT DETECTED NOT DETECTED Final   Carbapenem resistance NOT DETECTED NOT DETECTED Final   Haemophilus influenzae NOT DETECTED NOT DETECTED Final   Neisseria meningitidis NOT DETECTED NOT DETECTED Final   Pseudomonas aeruginosa NOT DETECTED NOT DETECTED Final   Candida albicans NOT DETECTED NOT DETECTED Final   Candida glabrata NOT DETECTED NOT DETECTED Final   Candida krusei NOT DETECTED  NOT DETECTED Final   Candida parapsilosis NOT DETECTED NOT DETECTED Final   Candida tropicalis NOT DETECTED NOT DETECTED Final    Comment: Performed at [facility], [address] [place], [place] [address]  SARS Coronavirus 2 [facility] order, Performed in [facility] hospital lab) Nasopharyngeal Nasopharyngeal Swab     Status: None   Collection Time: 02/18/19 10:18 AM   Specimen: Nasopharyngeal Swab  Result Value Ref Range Status   SARS Coronavirus 2 NEGATIVE NEGATIVE Final    Comment: (NOTE) If result is NEGATIVE SARS-CoV-2 target nucleic acids are NOT DETECTED. The SARS-CoV-2 RNA is generally detectable in upper and lower  respiratory specimens during the acute phase of infection. The lowest  concentration of SARS-CoV-2 viral copies this assay can detect is 250  copies / mL. A negative result does not preclude SARS-CoV-2 infection  and should not be used as the sole basis  for treatment or other  patient management decisions.  A negative result may occur with  improper specimen collection / handling, submission of specimen other  than nasopharyngeal swab, presence of viral mutation(s) within the  areas targeted by this assay, and inadequate number of viral copies  (<250 copies / mL). A negative result must be combined with clinical  observations, patient history, and epidemiological information. If result is POSITIVE SARS-CoV-2 target nucleic acids are DETECTED. The SARS-CoV-2 RNA is generally detectable in upper and lower  respiratory specimens dur ing the acute phase of infection.  Positive  results are indicative of active infection with SARS-CoV-2.  Clinical  correlation with patient history and other diagnostic information is  necessary to determine patient infection status.  Positive results do  not rule out bacterial infection or co-infection with other viruses. If result is PRESUMPTIVE POSTIVE SARS-CoV-2 nucleic acids MAY BE PRESENT.   A presumptive positive  result was obtained on the submitted specimen  and confirmed on repeat testing.  While 2019 novel coronavirus  (SARS-CoV-2) nucleic acids may be present in the submitted sample  additional confirmatory testing may be necessary for epidemiological  and / or clinical management purposes  to differentiate between  SARS-CoV-2 and other Sarbecovirus currently known to infect humans.  If clinically indicated additional testing with an alternate test  methodology [phone]) is advised. The SARS-CoV-2 RNA is generally  detectable in upper and lower respiratory sp ecimens during the acute  phase of infection. The expected result is Negative. Fact Sheet for Patients:  StrictlyIdeas.no Fact Sheet for Healthcare Providers: BankingDealers.co.za This test is not yet approved or cleared by the [place] FDA and has been authorized for detection and/or diagnosis of SARS-CoV-2 by FDA under an Emergency Use Authorization (EUA).  This EUA will remain in effect (meaning this test can be used) for the duration of the COVID-19 declaration under Section 564(b)(1) of the Act, 21 U.S.C. section 360bbb-3(b)(1), unless the authorization is terminated or revoked sooner. Performed at [facility], [address] [place], [place] [address]   Culture, blood (routine x 2)     Status: None (Preliminary result)   Collection Time: 02/18/19 10:28 AM   Specimen: BLOOD  Result Value Ref Range Status   Specimen Description   Final    BLOOD RIGHT ANTECUBITAL Performed at [facility], [place] [address] [place], [place] [address]    Special Requests   Final    BOTTLES DRAWN AEROBIC AND ANAEROBIC Blood Culture adequate volume   Culture  Setup Time   Final    GRAM NEGATIVE RODS IN BOTH AEROBIC AND ANAEROBIC BOTTLES CRITICAL VALUE NOTED.  VALUE IS CONSISTENT WITH PREVIOUSLY REPORTED AND CALLED VALUE.    Culture   Final    NO GROWTH < 24 HOURS Performed at  [facility], [address] [place], [place] [address]    Report Status PENDING  Incomplete    Coagulation Studies: No results for input(s): LABPROT, INR in the last 72 hours.  Urinalysis: Recent Labs    02/18/19 2335  COLORURINE AMBER*  LABSPEC 1.016  PHURINE 8.0  GLUCOSEU NEGATIVE  HGBUR SMALL*  BILIRUBINUR NEGATIVE  KETONESUR NEGATIVE  PROTEINUR 100*  NITRITE NEGATIVE  LEUKOCYTESUR LARGE*      Imaging: Ct Head Wo Contrast  Result Date: 02/18/2019 CLINICAL DATA:  Altered level of consciousness, unexplained, lethargy, hypotension, diabetes mellitus, hypertension, lung cancer EXAM: CT HEAD WITHOUT CONTRAST TECHNIQUE: Contiguous axial images were obtained from the base of the  skull through the vertex without intravenous contrast. Sagittal and coronal MPR images reconstructed from axial data set. COMPARISON:  MR brain 02/07/2019 FINDINGS: Brain: Mild generalized atrophy. Normal ventricular morphology. No midline shift or mass effect.2 dense calcification of falx. No intracranial hemorrhage, mass lesion or evidence of acute infarction. No extra-axial fluid collections. Vascular: No hyperdense vessels Skull: Intact Sinuses/Orbits: Clear Other: N/A IMPRESSION: No acute intracranial abnormalities. Electronically Signed   By: Lavonia Dana M.D.   On: 02/18/2019 11:14   Mr Brain Wo Contrast  Result Date: 02/18/2019 CLINICAL DATA:  Altered level of consciousness with change in speech. History of hypertension diabetes and lung cancer. EXAM: MRI HEAD WITHOUT CONTRAST TECHNIQUE: Multiplanar, multiecho pulse sequences of the brain and surrounding structures were obtained without intravenous contrast. COMPARISON:  CT head 02/18/2019 FINDINGS: Brain: Motion degraded study. Rapid scanning utilized due to motion. Negative for acute infarct. Negative for mass or edema. Ventricle size normal. No hemorrhage or fluid collection. Scattered small white matter hyperintensities bilaterally. Vascular:  Normal arterial flow voids Skull and upper cervical spine: Negative Sinuses/Orbits: Negative Other: None IMPRESSION: Motion degraded study No acute abnormality. Minimal white matter disease which appears chronic and unchanged from prior MRI Electronically Signed   By: Franchot Gallo M.D.   On: 02/18/2019 22:07   Dg Chest Portable 1 View  Result Date: 02/18/2019 CLINICAL DATA:  Weakness.  Confusion. EXAM: PORTABLE CHEST 1 VIEW COMPARISON:  Weakness and confusion. FINDINGS: Interval removal of right PICC line. Heart size normal. Pulmonary venous congestion. Progressive right base atelectasis/infiltrate and right-sided pleural effusion. Left base subsegmental atelectasis again noted. No pneumothorax. IMPRESSION: 1.  Interim removal of right PICC line. 2. Progressive right base atelectasis/infiltrate and right-sided pleural effusion. Mild left base subsegmental atelectasis again noted. Electronically Signed   By: Marcello Moores  Register   On: 02/18/2019 09:54     Medications:   . sodium chloride 50 mL (02/18/19 2259)  . cefTRIAXone (ROCEPHIN)  IV 2 g (02/18/19 2302)   . calcium-vitamin D  1 tablet Oral Q breakfast  . diltiazem  30 mg Oral Q8H  . enoxaparin (LOVENOX) injection  100 mg Subcutaneous BID  . feeding supplement (PRO-STAT SUGAR FREE 64)  30 mL Oral Daily  . multivitamin with minerals  1 tablet Oral Daily  . pantoprazole  40 mg Oral Daily  . polyethylene glycol  17 g Oral Daily  . vitamin C  250 mg Oral BID   sodium chloride, ipratropium-albuterol  Assessment/ Plan:  78 y.o. female with a PMHx ofdiabetes mellitus type 2, hypertension,hyperlipidemia, GERD, cervical myelopathy, lumbar stenosis, seasonal allergies, degenerative disc disease, vitamin D deficiency, malignant pleural effusion, stage 4 Adenocarcinoma of the lung, atrial fibrrilation  1. Hyponatremia, SIADH 2. Hyperkalemia 3.  Altered mental status  Hyponatremia likely secondary to SIADH.  Will need fluid restriction when able  to take p.o.  For now continue to monitor.  Do not believe hyponatremia is causing the mental confusion  Start Lokelma for hyperkalemia    LOS: [place] 8/8/20209:49 AM  [place], [place]  Note: This note was prepared with Dragon dictation. Any transcription errors are unintentional

## 2019-02-19 NOTE — Progress Notes (Signed)
Attempted piv x 2 with no success. Will order iv team consult.

## 2019-02-19 NOTE — Evaluation (Signed)
Physical Therapy Evaluation Patient Details Name: Cathy Buck MRN: [id number] DOB: May 19, 1941 Today's Date: 02/19/2019   History of Present Illness  78 year old female admitted from [facility] with altered mental status, she was here last week due to shortness of breath and weakness and was noted to be severely hyponatremic.  PMH: HTN, DM II, GERD, and possible adenocarcinoma followed by oncology.  Clinical Impression  Pt pleasantly confused t/o the PT exam, which however was ultimately quite limited.  She spent much of the time counting in a sort of atonal song but clearly was confused to the point of limited participation.  She was able to do some LE strength testing/exercises but generally was AAROM with c/o b/l hip and knee pain with more than minimal ROM.  Pt's HR prior to session was in the 60-70 range, while attempting to position for transition to sitting EOB tele box was reading in the 140s and further PT intervention was deferred.  Overall pt was very limited, more so than even the significant functional limits noted during PT exam during last week's admission, but is now also very confused.  Pt clearly unable to go home at this time (regardless of amount of assist) and will require STR when medically ready for d/c.    Follow Up Recommendations SNF    Equipment Recommendations  (TBD at next venue of care)    Recommendations for Other Services       Precautions / Restrictions Precautions Precautions: Fall Restrictions Weight Bearing Restrictions: No      Mobility  Bed Mobility Overal bed mobility: Needs Assistance Bed Mobility: Supine to Sit Rolling: Max assist         General bed mobility comments: attempted to assist moving toward sitting EOB with hip shift but pt too confused to assist, then looked at HR and noted to be tachy ~140 bpm and further mobility deferred  Transfers                    Ambulation/Gait                Stairs            Wheelchair Mobility    Modified Rankin (Stroke Patients Only)       Balance       Sitting balance - Comments: unable to test secondary to confusion, elevated HR - however given her function today she likely would have struggled to maintain sitting w/o substantial assist                                     Pertinent Vitals/Pain Pain Assessment: No/denies pain    Home Living Family/patient expects to be discharged to:: Skilled nursing facility Living Arrangements: Spouse/significant other Available Help at Discharge: Family;Available 24 hours/day Type of Home: House Home Access: Stairs to enter   [employer] of Steps: 1 step after inside the home to get down into living area with a grab bar; level entry into the home from outside Home Layout: One level Home Equipment: Shower seat;Grab bars - toilet;Walker - 4 wheels Additional Comments: Pt arrived from [facility], will very likely need to return    Prior Function Level of Independence: Independent with assistive device(s)         Comments: per PT note from last week: Mod Ind amb with a rollator limited community distances, no fall history, Ind with ADLs.  Pt was unable to walk on that admit and went to STR     Hand Dominance        Extremity/Trunk Assessment   Upper Extremity Assessment Upper Extremity Assessment: Generalized weakness;Difficult to assess due to impaired cognition    Lower Extremity Assessment Lower Extremity Assessment: Generalized weakness;Difficult to assess due to impaired cognition(little AROM despite cuing, c/o pain with hip/knee mvt)       Communication   Communication: (Pt very confused)  Cognition Arousal/Alertness: Awake/alert Behavior During Therapy: (Pt "singing" ABC's and 123's randomly t/o the session) Overall Cognitive Status: Impaired/Different from baseline Area of Impairment: Awareness                               General  Comments: Pt did not know situation, unable to consistently follow any instructions, randomly counting/singing confusedly t/o the session.  Poor awareness      General Comments      Exercises     Assessment/Plan    PT Assessment Patient needs continued PT services  PT Problem List Decreased strength;Decreased activity tolerance;Decreased balance;Decreased mobility;Decreased cognition;Decreased knowledge of use of DME;Decreased safety awareness;Decreased range of motion;Cardiopulmonary status limiting activity       PT Treatment Interventions DME instruction;Gait training;Stair training;Functional mobility training;Therapeutic activities;Therapeutic exercise;Balance training;Patient/family education    PT Goals (Current goals can be found in the Care Plan section)  Acute Rehab PT Goals Patient Stated Goal: pt too confused to state goals PT Goal Formulation: Patient unable to participate in goal setting Time For Goal Achievement: 03/05/19 Potential to Achieve Goals: Fair    Frequency Min 2X/week   Barriers to discharge        Co-evaluation               AM-PAC PT "6 Clicks" Mobility  Outcome Measure Help needed turning from your back to your side while in a flat bed without using bedrails?: Total Help needed moving from lying on your back to sitting on the side of a flat bed without using bedrails?: Total Help needed moving to and from a bed to a chair (including a wheelchair)?: Total Help needed standing up from a chair using your arms (e.g., wheelchair or bedside chair)?: Total Help needed to walk in hospital room?: Total Help needed climbing 3-5 steps with a railing? : Total 6 Click Score: 6    End of Session   Activity Tolerance: Other (comment)(limited due to confusion and elevated HR) Patient left: with bed alarm set;with call bell/phone within reach   PT Visit Diagnosis: Muscle weakness (generalized) (M62.81);Difficulty in walking, not elsewhere classified  (R26.2)    Time: 7858-8502 PT Time Calculation (min) (ACUTE ONLY): 21 min   Charges:   PT Evaluation $PT Eval Low Complexity: 1 Low          Kreg Shropshire, DPT 02/19/2019, 10:23 AM

## 2019-02-19 NOTE — Progress Notes (Signed)
OT Cancellation Note  Patient Details Name: Cathy Buck MRN: [id number] DOB: [birth year]   Cancelled Treatment:    Reason Eval/Treat Not Completed: Patient not medically ready Pt with up trending K+ this date at 5.7, out of therapeutic range to initiate OT services. Per PT who checked on pt for appropriateness, she continues to be extremely lethargic and lacking awareness. Will continue to hold until pt available and appropriate to initiate OT POC.    Zenovia Jarred, MSOT, OTR/L [place] OT/ Acute Relief OT ascom [phone]  Zenovia Jarred 02/19/2019, 10:09 AM

## 2019-02-19 NOTE — Progress Notes (Signed)
PT Cancellation Note  Patient Details Name: Cathy Buck MRN: [id number] DOB: May 24, 1941   Cancelled Treatment:     Pt did poorly with PT session this AM, now transferred to CCU, will complete PT orders at this time.  Will need new PT orders should she become appropriate for such intervention.     Kreg Shropshire, DPT 02/19/2019, 2:58 PM

## 2019-02-19 NOTE — Progress Notes (Signed)
[facility] [place] NAME: [place]    MR#:  [id number]  DATE OF BIRTH:  1941-03-17  SUBJECTIVE:  CHIEF COMPLAINT: Patient is lethargic and became hypoxemic.  Requiring BiPAP based on ABGs  REVIEW OF SYSTEMS:  Review of system unobtainable as the patient is encephalopathic  DRUG ALLERGIES:   Allergies  Allergen Reactions  . Codeine Hives  . Atorvastatin Other (See Comments)  . Citalopram Other (See Comments)    Nausea  . Keflex [Cephalexin] Diarrhea  . Tylenol [Acetaminophen] Hives    VITALS:  Blood pressure 112/70, pulse 66, temperature 98.4 F (36.9 C), resp. rate 18, weight 108 kg, SpO2 91 %.  PHYSICAL EXAMINATION:  GENERAL:  78 y.o.-year-old patient lying in the bed with no acute distress.  EYES: Pupils equal, round, reactive to light and accommodation. No scleral icterus. Extraocular muscles intact.  HEENT: Head atraumatic, normocephalic. Oropharynx and nasopharynx clear.  NECK:  Supple, no jugular venous distention. No thyroid enlargement, no tenderness.  LUNGS: Normal breath sounds bilaterally, no wheezing, rales,rhonchi or crepitation. No use of accessory muscles of respiration.  CARDIOVASCULAR: S1, S2 normal. No murmurs, rubs, or gallops.  ABDOMEN: Soft, nontender, nondistended. Bowel sounds present.  EXTREMITIES: No pedal edema, cyanosis, or clubbing.  NEUROLOGIC: Lethargic, arousable but disoriented sensation intact. Gait not checked for safety PSYCHIATRIC: The patient is disoriented and arousable SKIN: No obvious rash, lesion, or ulcer.    LABORATORY PANEL:   CBC Recent Labs  Lab 02/18/19 0941  WBC 11.3*  HGB 11.0*  HCT 35.6*  PLT 253   ------------------------------------------------------------------------------------------------------------------  Chemistries  Recent Labs  Lab 02/18/19 0941 02/19/19 0629  NA 129* 131*  K 5.2* 5.7*  CL 83* 89*  CO2 34* 29  GLUCOSE 152* 114*  BUN 48* 53*   CREATININE 0.94 0.79  CALCIUM 9.7 9.4  AST 37  --   ALT 49*  --   ALKPHOS 81  --   BILITOT 0.8  --    ------------------------------------------------------------------------------------------------------------------  Cardiac Enzymes No results for input(s): TROPONINI in the last 168 hours. ------------------------------------------------------------------------------------------------------------------  RADIOLOGY:  Ct Head Wo Contrast  Result Date: 02/18/2019 CLINICAL DATA:  Altered level of consciousness, unexplained, lethargy, hypotension, diabetes mellitus, hypertension, lung cancer EXAM: CT HEAD WITHOUT CONTRAST TECHNIQUE: Contiguous axial images were obtained from the base of the skull through the vertex without intravenous contrast. Sagittal and coronal MPR images reconstructed from axial data set. COMPARISON:  MR brain 02/07/2019 FINDINGS: Brain: Mild generalized atrophy. Normal ventricular morphology. No midline shift or mass effect.2 dense calcification of falx. No intracranial hemorrhage, mass lesion or evidence of acute infarction. No extra-axial fluid collections. Vascular: No hyperdense vessels Skull: Intact Sinuses/Orbits: Clear Other: N/A IMPRESSION: No acute intracranial abnormalities. Electronically Signed   By: Lavonia Dana M.D.   On: 02/18/2019 11:14   Mr Brain Wo Contrast  Result Date: 02/18/2019 CLINICAL DATA:  Altered level of consciousness with change in speech. History of hypertension diabetes and lung cancer. EXAM: MRI HEAD WITHOUT CONTRAST TECHNIQUE: Multiplanar, multiecho pulse sequences of the brain and surrounding structures were obtained without intravenous contrast. COMPARISON:  CT head 02/18/2019 FINDINGS: Brain: Motion degraded study. Rapid scanning utilized due to motion. Negative for acute infarct. Negative for mass or edema. Ventricle size normal. No hemorrhage or fluid collection. Scattered small white matter hyperintensities bilaterally. Vascular: Normal  arterial flow voids Skull and upper cervical spine: Negative Sinuses/Orbits: Negative Other: None IMPRESSION: Motion degraded study No acute abnormality. Minimal white matter  disease which appears chronic and unchanged from prior MRI Electronically Signed   By: Franchot Gallo M.D.   On: 02/18/2019 22:07   Dg Chest Port 1 View  Result Date: 02/19/2019 CLINICAL DATA:  Shortness of breath. EXAM: PORTABLE CHEST 1 VIEW COMPARISON:  02/18/2019 FINDINGS: Stable heart size and aortic tortuosity. Increased atelectasis of the right lower lung with probable component of moderate right pleural effusion. No overt pulmonary edema. No pneumothorax. IMPRESSION: Increased atelectasis of the right lung with probable component of moderate right pleural effusion. Electronically Signed   By: Aletta Edouard M.D.   On: 02/19/2019 12:56   Dg Chest Portable 1 View  Result Date: 02/18/2019 CLINICAL DATA:  Weakness.  Confusion. EXAM: PORTABLE CHEST 1 VIEW COMPARISON:  Weakness and confusion. FINDINGS: Interval removal of right PICC line. Heart size normal. Pulmonary venous congestion. Progressive right base atelectasis/infiltrate and right-sided pleural effusion. Left base subsegmental atelectasis again noted. No pneumothorax. IMPRESSION: 1.  Interim removal of right PICC line. 2. Progressive right base atelectasis/infiltrate and right-sided pleural effusion. Mild left base subsegmental atelectasis again noted. Electronically Signed   By: Marcello Moores  Register   On: 02/18/2019 09:54    EKG:   Orders placed or performed during the hospital encounter of 02/18/19  . EKG 12-Lead  . EKG 12-Lead  . EKG 12-Lead  . EKG 12-Lead    ASSESSMENT AND PLAN:   #Acute metabolic encephalopathy from acute hypoxemic and hypercapnic respiratory failure and hyponatremia, underlying UTI Transfer patient to stepdown unit BiPAP Blood cultures and urine cultures results are pending.  On IV Rocephin Pulmonology consult placed and discussed with Dr.  Mortimer Fries CT head negative abnormalities  # New onset A. fib with RVR -initially with low BP now hemodynamically stable - EKG+Telemetry - TrendTroponins-47 - TSH, FT4 - Recent TTEcho 01/2019 with LVEF 60-65% - Diltiazem 30mg  PO qid while titrating for rate control, then transition to SA daily formulation  - Metoprolol on hold as the patient is currently on BiPAP - Cardiology consult  #. 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 Candiss Norse - Follow serial sodium 129-131  # . Metastatic adenocarcinoma - Following with Dr. Tasia Catchings.  Oncology consult of Dr. Mike Gip is pending - Plan PET scan as outpatient once stable  5. Type 2 diabetes mellitus -Start sliding scale  6.   Hyperkalemia Kayexalate given repeat a.m. labs   7. Thromboembolism Risk Management - Lovenox per pharmacy protocol pending cardiology evaluation       All the records are reviewed and case discussed with Care Management/Social Workerr. Management plans discussed with the patient, daughter Lin Landsman healthcare power of attorney over phone and they are in agreement.  CODE STATUS: fc  TOTAL critical careTIME TAKING CARE OF THIS PATIENT: 41  minutes.   POSSIBLE D/C IN 2  DAYS, DEPENDING ON CLINICAL CONDITION.  Note: This dictation was prepared with Dragon dictation along with smaller phrase technology. Any transcriptional errors that result from this process are unintentional.   Nicholes Mango M.D on 02/19/2019 at 2:17 PM  Between 7am to 6pm - Pager - [phone] After 6pm go to [email or web address] - password [email or web address] [facility]  Office  [phone]  CC: Primary care physician; Sallee Lange, NP

## 2019-02-19 NOTE — Consult Note (Signed)
[facility]  Date of admission:  02/18/2019  Inpatient day:  02/19/2019  Consulting physician:  Dr Nicholes Mango   Reason for Consultation:  Lung cancer  Chief Complaint: Cathy Buck is a 78 y.o. female with stage IV adenocarcinoma of the lung who was admitted with altered mental status and new onset atrial fibrillation with RVR.  HPI:  The patient was admitted to [facility] from 02/03/2019 - 02/14/2019 progressive weakness and hyponatremia (sodium 113).  CT scan revealed a moderately large right-sided pleural effusion with consolidation of the right lower lobe and prominent mediastinal adenopathy.  Head MRI without contrast revealed no abnormality.  Received hypertonic saline in the ICU.  She underwent right sided thoracentesis on 02/07/2019.  Cytology confirmed adenocarcinoma consistent with lung origin.  She was discharged to [facility].  Sodium was 132.  The patient's daughter notes that her health has declined since discharge.  She has been increasingly more confused over the past 2 days.  She was seen in the oncology clinic by Dr. Tasia Catchings 02/18/2019.  She appeared confused and was unable to provide any history.  Blood pressure was 87/55.  She was referred to the emergency room.  In the emergency room, she was awake but with incomprehensible speech.  Sodium was 129.  Lactic acid 1.6.  White count 11,300.  Urinalysis revealed greater than 50 white blood cells and negative nitrites.  Urine culture is negative to date.  Chest x-ray revealed progressive right base atelectasis/infiltrate and right-sided pleural effusion with mild left base subsegmental atelectasis.  Head CT and head MRI without contrast revealed no acute abnormalities.  ABG revealed CO2 retention with pH of 7.37, PCO2 78, PO2 81, and bicarb 45.1.  She was placed on BiPAP with plan to transfer to the ICU.  Currently, she is mumbling and is unable to provide any history.  Her daughter, [facility], is at her  bedside.  She is her medical power of attorney.  Past Medical History:  Diagnosis Date  . Diabetes mellitus without complication ([place])   . Hypertension     Past Surgical History:  Procedure Laterality Date  . ABDOMINAL HYSTERECTOMY    . CHOLECYSTECTOMY      No family history on file.  Social History:  reports that she has quit smoking. She has never used smokeless tobacco. She reports previous alcohol use. She reports previous drug use.  The patient lives in [place] with her husband Jeneen Rinks.  Was recently discharged to rehab.  Her medical power of attorney is her daughter [facility].  She is accompanied today by her nurse and her daughter.  Allergies:  Allergies  Allergen Reactions  . Codeine Hives  . Atorvastatin Other (See Comments)  . Citalopram Other (See Comments)    Nausea  . Keflex [Cephalexin] Diarrhea  . Tylenol [Acetaminophen] Hives    Medications Prior to Admission  Medication Sig Dispense Refill  . albuterol (VENTOLIN HFA) 108 (90 Base) MCG/ACT inhaler Inhale 1 puff into the lungs every 6 (six) hours as needed for wheezing or shortness of breath.    . Amino Acids-Protein Hydrolys (FEEDING SUPPLEMENT, PRO-STAT SUGAR FREE 64,) LIQD Take 30 mLs by mouth daily.    Marland Kitchen atenolol (TENORMIN) 50 MG tablet Take 50 mg by mouth daily.    . Calcium Carb-Cholecalciferol (CALCIUM-VITAMIN D) 500-200 MG-UNIT tablet Take 1 tablet by mouth 2 (two) times a day.    . Multiple Vitamin (MULTIVITAMIN WITH MINERALS) TABS tablet Take 1 tablet by mouth daily.    Marland Kitchen  omeprazole (PRILOSEC) 40 MG capsule Take 40 mg by mouth daily. 30 minutes before food and meds    . polyethylene glycol (MIRALAX / GLYCOLAX) 17 g packet Take 17 g by mouth daily. 30 each 0  . vitamin C (ASCORBIC ACID) 250 MG tablet Take 250 mg by mouth 2 (two) times daily.      Review of Systems: Patient is unable to provide any history. Per her daughter, she has declined since discharge more acutely over the past 2 days   PERFORMANCE STATUS 3. GENERAL: Her daughter is unaware of any fever.  Physical Exam:  Blood pressure 113/73, pulse 66, temperature 98.7 F (37.1 C), temperature source Axillary, resp. rate (!) 25, weight 238 lb 1.6 oz (108 kg), SpO2 98 %.  GENERAL:  Elderly woman sitting sitting up on the medical unit with BIPAP in place. MENTAL STATUS:  Patient squeezes hand.  Incomprehensible speech. HEAD:  Short gray hair.  Normocephalic, atraumatic, face symmetric, no Cushingoid features. EYES:  Brown eyes.  Pupils equal round and reactive to light and accomodation.  No conjunctivitis or scleral icterus. ENT:  BIPAP in place.  Oropharynx not visulaized.  RESPIRATORY:  Decreased breath sounds right base anteriorly.  No rales, wheezes or rhonchi. CARDIOVASCULAR:  Regular rate and rhythm without murmur, rub or gallop. ABDOMEN:  Soft, non-tender, with active bowel sounds, and no hepatosplenomegaly.  No masses. SKIN:  No rashes, ulcers or lesions. EXTREMITIES: Trace lower extremity edema.  No skin discoloration or tenderness.  No palpable cords. LYMPH NODES: No palpable cervical, supraclavicular, axillary or inguinal adenopathy  NEUROLOGICAL: Moves all 4 extremities.   Results for orders placed or performed during the hospital encounter of 02/18/19 (from the past 48 hour(s))  Comprehensive metabolic panel     Status: Abnormal   Collection Time: 02/18/19  9:41 AM  Result Value Ref Range   Sodium 129 (L) 135 - 145 mmol/L   Potassium 5.2 (H) 3.5 - 5.1 mmol/L   Chloride 83 (L) 98 - 111 mmol/L   CO2 34 (H) 22 - 32 mmol/L   Glucose, Bld 152 (H) 70 - 99 mg/dL   BUN 48 (H) 8 - 23 mg/dL   Creatinine, Ser 0.94 0.44 - 1.00 mg/dL   Calcium 9.7 8.9 - 10.3 mg/dL   Total Protein 6.8 6.5 - 8.1 g/dL   Albumin 2.9 (L) 3.5 - 5.0 g/dL   AST 37 15 - 41 U/L   ALT 49 (H) 0 - 44 U/L   Alkaline Phosphatase 81 38 - 126 U/L   Total Bilirubin 0.8 0.3 - 1.2 mg/dL   GFR calc non Af Amer 58 (L) >60 mL/min   GFR calc Af Amer  >60 >60 mL/min   Anion gap 12 5 - 15    Comment: Performed at [facility], [address] [place], [place] [address]  Acetaminophen level     Status: Abnormal   Collection Time: 02/18/19  9:41 AM  Result Value Ref Range   Acetaminophen (Tylenol), Serum <10 (L) 10 - 30 ug/mL    Comment: (NOTE) Therapeutic concentrations vary significantly. A range of 10-30 ug/mL  may be an effective concentration for many patients. However, some  are best treated at concentrations outside of this range. Acetaminophen concentrations >150 ug/mL at 4 hours after ingestion  and >50 ug/mL at 12 hours after ingestion are often associated with  toxic reactions. Performed at [facility], [address] [place], [place] [address]   Ethanol     Status:  None   Collection Time: 02/18/19  9:41 AM  Result Value Ref Range   Alcohol, Ethyl (B) <10 <10 mg/dL    Comment: (NOTE) Lowest detectable limit for serum alcohol is 10 mg/dL. For medical purposes only. Performed at [facility], [address] [place], [place] [address]   Lipase, blood     Status: None   Collection Time: 02/18/19  9:41 AM  Result Value Ref Range   Lipase 25 11 - 51 U/L    Comment: Performed at [facility], [address] [place], [place] [address]  Salicylate level     Status: None   Collection Time: 02/18/19  9:41 AM  Result Value Ref Range   Salicylate Lvl <3.8 2.8 - 30.0 mg/dL    Comment: Performed at [facility], [place], [place] [address]  Troponin I (High Sensitivity)     Status: Abnormal   Collection Time: 02/18/19  9:41 AM  Result Value Ref Range   Troponin I (High Sensitivity) 53 (H) <18 ng/L    Comment: (NOTE) Elevated high sensitivity troponin I (hsTnI) values and significant  changes across serial measurements may suggest ACS but many other  chronic and acute conditions are known to elevate hsTnI results.  Refer to the "Links" section for chest pain  algorithms and additional  guidance. Performed at [facility], [address] [place], [place] [address]   Lactic acid, plasma     Status: None   Collection Time: 02/18/19  9:41 AM  Result Value Ref Range   Lactic Acid, Venous 1.6 0.5 - 1.9 mmol/L    Comment: Performed at [facility], [address] [place], [place] [address]  CBC with Differential     Status: Abnormal   Collection Time: 02/18/19  9:41 AM  Result Value Ref Range   WBC 11.3 (H) 4.0 - 10.5 K/uL   RBC 4.48 3.87 - 5.11 MIL/uL   Hemoglobin 11.0 (L) 12.0 - 15.0 g/dL   HCT 35.6 (L) 36.0 - 46.0 %   MCV 79.5 (L) 80.0 - 100.0 fL   MCH 24.6 (L) 26.0 - 34.0 pg   MCHC 30.9 30.0 - 36.0 g/dL   RDW 15.1 11.5 - 15.5 %   Platelets 253 150 - 400 K/uL   nRBC 0.2 0.0 - 0.2 %   Neutrophils Relative % 85 %   Neutro Abs 9.7 (H) 1.7 - 7.7 K/uL   Lymphocytes Relative 5 %   Lymphs Abs 0.6 (L) 0.7 - 4.0 K/uL   Monocytes Relative 3 %   Monocytes Absolute 0.3 0.1 - 1.0 K/uL   Eosinophils Relative 0 %   Eosinophils Absolute 0.0 0.0 - 0.5 K/uL   Basophils Relative 1 %   Basophils Absolute 0.1 0.0 - 0.1 K/uL   WBC Morphology MILD LEFT SHIFT (1-5% METAS, OCC MYELO, OCC BANDS)    Smear Review Normal platelet morphology    Immature Granulocytes 6 %   Abs Immature Granulocytes 0.62 (H) 0.00 - 0.07 K/uL   Polychromasia PRESENT     Comment: Performed at [facility], [address] [place], [place] [address]  Culture, blood (routine x 2)     Status: None (Preliminary result)   Collection Time: 02/18/19  9:41 AM   Specimen: BLOOD  Result Value Ref Range   Specimen Description BLOOD LEFT ANTECUBITAL    Special Requests      BOTTLES DRAWN AEROBIC AND ANAEROBIC Blood Culture results may not be optimal due  to an excessive volume of blood received in culture bottles   Culture  Setup Time      Organism ID to follow GRAM NEGATIVE RODS IN BOTH AEROBIC AND ANAEROBIC BOTTLES CRITICAL RESULT CALLED TO, READ BACK BY  AND VERIFIED WITH: JASON ROBBINS @ 2107 ON 02/18/2019 BY CAF Performed at [facility], [address] [place], [place] [address]    Culture GRAM NEGATIVE RODS    Report Status PENDING   TSH     Status: None   Collection Time: 02/18/19  9:41 AM  Result Value Ref Range   TSH 1.758 0.350 - 4.500 uIU/mL    Comment: Performed by a 3rd Generation assay with a functional sensitivity of <=0.01 uIU/mL. Performed at [facility], [address] [place], [place] [address]   Blood Culture ID Panel (Reflexed)     Status: Abnormal   Collection Time: 02/18/19  9:41 AM  Result Value Ref Range   Enterococcus species NOT DETECTED NOT DETECTED   Listeria monocytogenes NOT DETECTED NOT DETECTED   Staphylococcus species NOT DETECTED NOT DETECTED   Staphylococcus aureus (BCID) NOT DETECTED NOT DETECTED   Streptococcus species NOT DETECTED NOT DETECTED   Streptococcus agalactiae NOT DETECTED NOT DETECTED   Streptococcus pneumoniae NOT DETECTED NOT DETECTED   Streptococcus pyogenes NOT DETECTED NOT DETECTED   Acinetobacter baumannii NOT DETECTED NOT DETECTED   Enterobacteriaceae species DETECTED (A) NOT DETECTED    Comment: Enterobacteriaceae represent a large family of gram-negative bacteria, not a single organism. CRITICAL RESULT CALLED TO, READ BACK BY AND VERIFIED WITH: JASON ROBBINS @ 2107 ON 02/18/2019 BY CAF    Enterobacter cloacae complex NOT DETECTED NOT DETECTED   Escherichia coli NOT DETECTED NOT DETECTED   Klebsiella oxytoca NOT DETECTED NOT DETECTED   Klebsiella pneumoniae NOT DETECTED NOT DETECTED   Proteus species DETECTED (A) NOT DETECTED    Comment: CRITICAL RESULT CALLED TO, READ BACK BY AND VERIFIED WITH: JASON ROBBINS @ 2107 ON 02/18/2019 BY CAF    Serratia marcescens NOT DETECTED NOT DETECTED   Carbapenem resistance NOT DETECTED NOT DETECTED   Haemophilus influenzae NOT DETECTED NOT DETECTED   Neisseria meningitidis NOT DETECTED NOT DETECTED   Pseudomonas  aeruginosa NOT DETECTED NOT DETECTED   Candida albicans NOT DETECTED NOT DETECTED   Candida glabrata NOT DETECTED NOT DETECTED   Candida krusei NOT DETECTED NOT DETECTED   Candida parapsilosis NOT DETECTED NOT DETECTED   Candida tropicalis NOT DETECTED NOT DETECTED    Comment: Performed at [facility], [address] [place], [place] [address]  SARS Coronavirus 2 [facility] order, Performed in [facility] hospital lab) Nasopharyngeal Nasopharyngeal Swab     Status: None   Collection Time: 02/18/19 10:18 AM   Specimen: Nasopharyngeal Swab  Result Value Ref Range   SARS Coronavirus 2 NEGATIVE NEGATIVE    Comment: (NOTE) If result is NEGATIVE SARS-CoV-2 target nucleic acids are NOT DETECTED. The SARS-CoV-2 RNA is generally detectable in upper and lower  respiratory specimens during the acute phase of infection. The lowest  concentration of SARS-CoV-2 viral copies this assay can detect is 250  copies / mL. A negative result does not preclude SARS-CoV-2 infection  and should not be used as the sole basis for treatment or other  patient management decisions.  A negative result may occur with  improper specimen collection / handling, submission of specimen other  than nasopharyngeal swab, presence of viral mutation(s) within the  areas targeted by this assay, and inadequate number  of viral copies  (<250 copies / mL). A negative result must be combined with clinical  observations, patient history, and epidemiological information. If result is POSITIVE SARS-CoV-2 target nucleic acids are DETECTED. The SARS-CoV-2 RNA is generally detectable in upper and lower  respiratory specimens dur ing the acute phase of infection.  Positive  results are indicative of active infection with SARS-CoV-2.  Clinical  correlation with patient history and other diagnostic information is  necessary to determine patient infection status.  Positive results do  not rule out bacterial infection or  co-infection with other viruses. If result is PRESUMPTIVE POSTIVE SARS-CoV-2 nucleic acids MAY BE PRESENT.   A presumptive positive result was obtained on the submitted specimen  and confirmed on repeat testing.  While 2019 novel coronavirus  (SARS-CoV-2) nucleic acids may be present in the submitted sample  additional confirmatory testing may be necessary for epidemiological  and / or clinical management purposes  to differentiate between  SARS-CoV-2 and other Sarbecovirus currently known to infect humans.  If clinically indicated additional testing with an alternate test  methodology [id number]) is advised. The SARS-CoV-2 RNA is generally  detectable in upper and lower respiratory sp ecimens during the acute  phase of infection. The expected result is Negative. Fact Sheet for Patients:  StrictlyIdeas.no Fact Sheet for Healthcare Providers: BankingDealers.co.za This test is not yet approved or cleared by the [place] FDA and has been authorized for detection and/or diagnosis of SARS-CoV-2 by FDA under an Emergency Use Authorization (EUA).  This EUA will remain in effect (meaning this test can be used) for the duration of the COVID-19 declaration under Section 564(b)(1) of the Act, 21 U.S.C. section 360bbb-3(b)(1), unless the authorization is terminated or revoked sooner. Performed at [facility], [address] [place], [place] [address]   Culture, blood (routine x 2)     Status: None (Preliminary result)   Collection Time: 02/18/19 10:28 AM   Specimen: BLOOD  Result Value Ref Range   Specimen Description      BLOOD RIGHT ANTECUBITAL Performed at [facility], [address], [place], [place] [address]    Special Requests      BOTTLES DRAWN AEROBIC AND ANAEROBIC Blood Culture adequate volume   Culture  Setup Time      GRAM NEGATIVE RODS IN BOTH AEROBIC AND ANAEROBIC BOTTLES CRITICAL VALUE NOTED.  VALUE IS  CONSISTENT WITH PREVIOUSLY REPORTED AND CALLED VALUE.    Culture      NO GROWTH < 24 HOURS Performed at [facility], [address] [place], [place] [address]    Report Status PENDING   Troponin I (High Sensitivity)     Status: Abnormal   Collection Time: 02/18/19 12:12 PM  Result Value Ref Range   Troponin I (High Sensitivity) 47 (H) <18 ng/L    Comment: (NOTE) Elevated high sensitivity troponin I (hsTnI) values and significant  changes across serial measurements may suggest ACS but many other  chronic and acute conditions are known to elevate hsTnI results.  Refer to the "Links" section for chest pain algorithms and additional  guidance. Performed at [facility], [address] [place], [place] [address]   Glucose, capillary     Status: Abnormal   Collection Time: 02/18/19  5:22 PM  Result Value Ref Range   Glucose-Capillary 100 (H) 70 - 99 mg/dL  Glucose, capillary     Status: Abnormal   Collection Time: 02/18/19  8:45 PM  Result Value Ref Range  Glucose-Capillary 123 (H) 70 - 99 mg/dL  Urinalysis, Complete w Microscopic     Status: Abnormal   Collection Time: 02/18/19 11:35 PM  Result Value Ref Range   Color, Urine AMBER (A) YELLOW    Comment: BIOCHEMICALS MAY BE AFFECTED BY COLOR   APPearance CLOUDY (A) CLEAR   Specific Gravity, Urine 1.016 1.005 - 1.030   pH 8.0 5.0 - 8.0   Glucose, UA NEGATIVE NEGATIVE mg/dL   Hgb urine dipstick SMALL (A) NEGATIVE   Bilirubin Urine NEGATIVE NEGATIVE   Ketones, ur NEGATIVE NEGATIVE mg/dL   Protein, ur 100 (A) NEGATIVE mg/dL   Nitrite NEGATIVE NEGATIVE   Leukocytes,Ua LARGE (A) NEGATIVE   RBC / HPF 6-10 0 - 5 RBC/hpf   WBC, UA >50 (H) 0 - 5 WBC/hpf   Bacteria, UA RARE (A) NONE SEEN   Squamous Epithelial / LPF 0-5 0 - 5    Comment: Performed at [facility], [address], [place], [place] [address]  Urine Drug Screen, Qualitative     Status: None   Collection Time: 02/18/19 11:35 PM  Result  Value Ref Range   Tricyclic, Ur Screen NONE DETECTED NONE DETECTED   Amphetamines, Ur Screen NONE DETECTED NONE DETECTED   MDMA (Ecstasy)Ur Screen NONE DETECTED NONE DETECTED   Cocaine Metabolite,Ur [place] NONE DETECTED NONE DETECTED   Opiate, Ur Screen NONE DETECTED NONE DETECTED   Phencyclidine (PCP) Ur S NONE DETECTED NONE DETECTED   Cannabinoid 50 Ng, Ur [place] NONE DETECTED NONE DETECTED   Barbiturates, Ur Screen NONE DETECTED NONE DETECTED   Benzodiazepine, Ur Scrn NONE DETECTED NONE DETECTED   Methadone Scn, Ur NONE DETECTED NONE DETECTED    Comment: (NOTE) Tricyclics + metabolites, urine    Cutoff 1000 ng/mL Amphetamines + metabolites, urine  Cutoff 1000 ng/mL MDMA (Ecstasy), urine              Cutoff 500 ng/mL Cocaine Metabolite, urine          Cutoff 300 ng/mL Opiate + metabolites, urine        Cutoff 300 ng/mL Phencyclidine (PCP), urine         Cutoff 25 ng/mL Cannabinoid, urine                 Cutoff 50 ng/mL Barbiturates + metabolites, urine  Cutoff 200 ng/mL Benzodiazepine, urine              Cutoff 200 ng/mL Methadone, urine                   Cutoff 300 ng/mL The urine drug screen provides only a preliminary, unconfirmed analytical test result and should not be used for non-medical purposes. Clinical consideration and professional judgment should be applied to any positive drug screen result due to possible interfering substances. A more specific alternate chemical method must be used in order to obtain a confirmed analytical result. Gas chromatography / mass spectrometry (GC/MS) is the preferred confirmat ory method. Performed at [facility], [address] [place], [place] [address]   Basic metabolic panel     Status: Abnormal   Collection Time: 02/19/19  6:29 AM  Result Value Ref Range   Sodium 131 (L) 135 - 145 mmol/L   Potassium 5.7 (H) 3.5 - 5.1 mmol/L   Chloride 89 (L) 98 - 111 mmol/L   CO2 29 22 - 32 mmol/L   Glucose, Bld 114 (H) 70 - 99 mg/dL    BUN 53 (H) 8 - 23  mg/dL   Creatinine, Ser 0.79 0.44 - 1.00 mg/dL   Calcium 9.4 8.9 - 10.3 mg/dL   GFR calc non Af Amer >60 >60 mL/min   GFR calc Af Amer >60 >60 mL/min   Anion gap 13 5 - 15    Comment: Performed at [facility], [address] [place], [place] [address]  Glucose, capillary     Status: Abnormal   Collection Time: 02/19/19  7:46 AM  Result Value Ref Range   Glucose-Capillary 120 (H) 70 - 99 mg/dL  Glucose, capillary     Status: Abnormal   Collection Time: 02/19/19 11:32 AM  Result Value Ref Range   Glucose-Capillary 148 (H) 70 - 99 mg/dL  Blood gas, arterial     Status: Abnormal   Collection Time: 02/19/19 12:09 PM  Result Value Ref Range   FIO2 0.44    Delivery systems NASAL CANNULA    pH, Arterial 7.37 7.350 - 7.450   pCO2 arterial 78 (HH) 32.0 - 48.0 mmHg    Comment: CRITICAL RESULT CALLED TO, READ BACK BY AND VERIFIED WITH: CRITICAL VALUE 02/19/19 1230 DR. GOURU/FD    pO2, Arterial 81 (L) 83.0 - 108.0 mmHg   Bicarbonate 45.1 (H) 20.0 - 28.0 mmol/L   Acid-Base Excess 15.8 (H) 0.0 - 2.0 mmol/L   O2 Saturation 95.5 %   Patient temperature 37.0    Collection site RIGHT BRACHIAL    Sample type ARTERIAL DRAW     Comment: Performed at [facility], [address] [place], [place] [address]  Glucose, capillary     Status: Abnormal   Collection Time: 02/19/19  2:15 PM  Result Value Ref Range   Glucose-Capillary 148 (H) 70 - 99 mg/dL   Ct Head Wo Contrast  Result Date: 02/18/2019 CLINICAL DATA:  Altered level of consciousness, unexplained, lethargy, hypotension, diabetes mellitus, hypertension, lung cancer EXAM: CT HEAD WITHOUT CONTRAST TECHNIQUE: Contiguous axial images were obtained from the base of the skull through the vertex without intravenous contrast. Sagittal and coronal MPR images reconstructed from axial data set. COMPARISON:  MR brain 02/07/2019 FINDINGS: Brain: Mild generalized atrophy. Normal ventricular morphology. No midline shift or  mass effect.2 dense calcification of falx. No intracranial hemorrhage, mass lesion or evidence of acute infarction. No extra-axial fluid collections. Vascular: No hyperdense vessels Skull: Intact Sinuses/Orbits: Clear Other: N/A IMPRESSION: No acute intracranial abnormalities. Electronically Signed   By: Lavonia Dana M.D.   On: 02/18/2019 11:14   Mr Brain Wo Contrast  Result Date: 02/18/2019 CLINICAL DATA:  Altered level of consciousness with change in speech. History of hypertension diabetes and lung cancer. EXAM: MRI HEAD WITHOUT CONTRAST TECHNIQUE: Multiplanar, multiecho pulse sequences of the brain and surrounding structures were obtained without intravenous contrast. COMPARISON:  CT head 02/18/2019 FINDINGS: Brain: Motion degraded study. Rapid scanning utilized due to motion. Negative for acute infarct. Negative for mass or edema. Ventricle size normal. No hemorrhage or fluid collection. Scattered small white matter hyperintensities bilaterally. Vascular: Normal arterial flow voids Skull and upper cervical spine: Negative Sinuses/Orbits: Negative Other: None IMPRESSION: Motion degraded study No acute abnormality. Minimal white matter disease which appears chronic and unchanged from prior MRI Electronically Signed   By: Franchot Gallo M.D.   On: 02/18/2019 22:07   Dg Chest Port 1 View  Result Date: 02/19/2019 CLINICAL DATA:  Shortness of breath. EXAM: PORTABLE CHEST 1 VIEW COMPARISON:  02/18/2019 FINDINGS: Stable heart size and aortic tortuosity. Increased atelectasis of the right lower lung with probable component  of moderate right pleural effusion. No overt pulmonary edema. No pneumothorax. IMPRESSION: Increased atelectasis of the right lung with probable component of moderate right pleural effusion. Electronically Signed   By: Aletta Edouard M.D.   On: 02/19/2019 12:56   Dg Chest Portable 1 View  Result Date: 02/18/2019 CLINICAL DATA:  Weakness.  Confusion. EXAM: PORTABLE CHEST 1 VIEW COMPARISON:   Weakness and confusion. FINDINGS: Interval removal of right PICC line. Heart size normal. Pulmonary venous congestion. Progressive right base atelectasis/infiltrate and right-sided pleural effusion. Left base subsegmental atelectasis again noted. No pneumothorax. IMPRESSION: 1.  Interim removal of right PICC line. 2. Progressive right base atelectasis/infiltrate and right-sided pleural effusion. Mild left base subsegmental atelectasis again noted. Electronically Signed   By: Marcello Moores  Register   On: 02/18/2019 09:54    Assessment:  The patient is a 78 y.o. woman with metastatic adenocarcinoma of the lung admitted with altered mental status and atrial fibrillation with RVR.  She is retaining CO2 and is currently on BiPAP.  Blood culture ID Enterobacteriaceae.  Symptomatically, she is incoherent and mumbling.  Exam reveals a recurrent right sided effusion.  Plan:   1.   Oncology  Patiently patient recently diagnosed with adenocarcinoma of the lung s/p thoracentesis.  She was scheduled to discuss her treatment plan with Dr. Tasia Catchings yesterday.     Current plan was for concurrent chemotherapy and immunotherapy.    Discussed with the patient's daughter current clinical condition.  She plans to discuss with her father (the patient's husband) CODE STATUS. 2.  Pulmonary  CXR reveals recurrent right-sided effusion and right base atelectasis/infiltrate.  Patient retaining CO2 and is currently on BiPAP.  Patient's daughter unsure about pursuing intubation. 3.  Infectious disease  Patient pancultured.  UA with numerous WBCs c/w UTI.  CXR with infiltrate/atelectasis.  Blood culture ID panel from yesterday reveals Enterobacteriaceae species.  Patient on ceftriaxone. 4.  Nephrology  Sodium 129. 5.  Altered mental status  Patient with CO2 retention and infection.  Head imaging negative.   Thank you for allowing me to participate in [facility] 's care.  I will follow her closely with you while hospitalized  unitl Dr Collie Siad return on Monday, 02/20/2019.   Lequita Asal, MD  02/19/2019, 3:18 PM

## 2019-02-19 NOTE — Progress Notes (Signed)
Patient transferred to ICU, family notified, report given to receiving nurse

## 2019-02-19 NOTE — NC FL2 (Signed)
[place] LEVEL OF CARE SCREENING TOOL     IDENTIFICATION  Patient Name: Cathy Buck Birthdate: [birth year] Sex: female Admission Date (Current Location): 02/18/2019  [place] and [place] Number:  Engineering geologist and Address:  [facility]: [address], [address], [place], [place] [address]      Provider Number: [id number]  Attending Physician Name and Address:  Nicholes Mango, MD  Relative Name and Phone Number:  homes,constiance Daughter   [phone]    Current Level of Care: SNF Recommended Level of Care: [place] Prior Approval Number:    Date Approved/Denied:   PASRR Number: [id number]  Discharge Plan: SNF    Current Diagnoses: Patient Active Problem List   Diagnosis Date Noted  . Altered mental status 02/18/2019  . 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 Pleural effusion   . Weakness   . Hyponatremia 02/03/2019  . HTN (hypertension) 02/03/2019  . Diabetes ([place]) 02/03/2019    Orientation RESPIRATION BLADDER Height & Weight     Self, Situation  O2 Incontinent Weight: 108 kg Height:     BEHAVIORAL SYMPTOMS/MOOD NEUROLOGICAL BOWEL NUTRITION STATUS      Continent Diet  AMBULATORY STATUS COMMUNICATION OF NEEDS Skin   Limited Assist Verbally Skin abrasions, Bruising                       Personal Care Assistance Level of Assistance  Bathing, Feeding, Total care, Dressing Bathing Assistance: Maximum assistance Feeding assistance: Independent Dressing Assistance: Limited assistance Total Care Assistance: Limited assistance   Functional Limitations Info  Sight, Speech, Hearing Sight Info: Adequate Hearing Info: Adequate Speech Info: Adequate    SPECIAL CARE FACTORS FREQUENCY  PT (By licensed PT), OT (By licensed OT)     PT Frequency: 5x per week OT Frequency: 5x per week            Contractures Contractures Info: Not present    Additional Factors Info  Code Status, Allergies Code Status  Info: Full code Allergies Info: codeine, atorvastatin, citalopram, keflex, tylenol           Current Medications (02/19/2019):  This is the current hospital active medication list Current Facility-Administered Medications  Medication Dose Route Frequency Provider Last Rate Last Dose  . 0.9 %  sodium chloride infusion   Intravenous PRN Lang Snow, NP 10 mL/hr at 02/18/19 2259 50 mL at 02/18/19 2259  . calcium-vitamin D (OSCAL WITH D) 500-200 MG-UNIT per tablet 1 tablet  1 tablet Oral Q breakfast Lang Snow, NP   1 tablet at 02/19/19 [phone]  . cefTRIAXone (ROCEPHIN) 2 g in sodium chloride 0.9 % 100 mL IVPB  2 g Intravenous Q24H Lang Snow, NP 200 mL/hr at 02/18/19 2302 2 g at 02/18/19 2302  . diltiazem (CARDIZEM) tablet 30 mg  30 mg Oral Q8H Lang Snow, NP   30 mg at 02/19/19 0604  . enoxaparin (LOVENOX) injection 100 mg  100 mg Subcutaneous BID Lang Snow, NP   100 mg at 02/18/19 2320  . feeding supplement (PRO-STAT SUGAR FREE 64) liquid 30 mL  30 mL Oral Daily Lang Snow, NP   30 mL at 02/19/19 0945  . ipratropium-albuterol (DUONEB) 0.5-2.5 (3) MG/3ML nebulizer solution 3 mL  3 mL Nebulization Q6H PRN Lang Snow, NP      . multivitamin with minerals tablet 1 tablet  1 tablet Oral Daily Ouma, Bing Neighbors, NP   1 tablet  at 02/19/19 0942  . pantoprazole (PROTONIX) EC tablet 40 mg  40 mg Oral Daily Lang Snow, NP   40 mg at 02/19/19 7322  . polyethylene glycol (MIRALAX / GLYCOLAX) packet 17 g  17 g Oral Daily Lang Snow, NP   17 g at 02/19/19 5672  . vitamin C (ASCORBIC ACID) tablet 250 mg  250 mg Oral BID Lang Snow, NP   250 mg at 02/19/19 0919     Discharge Medications: Please see discharge summary for a list of discharge medications.  Relevant Imaging Results:  Relevant Lab Results:   Additional Information ss [id number]  Latanya Maudlin, RN

## 2019-02-19 NOTE — Progress Notes (Signed)
Patient labored breathing, sat 91 % on 02 6 L [place] , Mr notified, Dr Margaretmary Eddy at bedside, new order blood gaz and P  c xray stat,.

## 2019-02-19 NOTE — Progress Notes (Signed)
Notified Dr. Jannifer Franklin pnt is back in AFIB however HR is NOT sustaining over 100. HR consistently in the 70-90's but occasionally up in the 140's. Pressure is stable but has become more hypotensive since beginning of shift.   Bolus ordered for 500 ml's and given per order.

## 2019-02-19 NOTE — Progress Notes (Signed)
Notified MD of Labs results this shift:   Blood cultures results this evening + Proteus species + Enterobacteriaceae species  UA  + Leukocytes + Rare Bacteria + WBC > 50 - Nitrates  In addition, family reported that patient was put on antibiotics 2 weeks ago for a UTI

## 2019-02-19 NOTE — Progress Notes (Signed)
Abnormal AGB,, MD notified, patient placed on Bipap, received order to transfer patient to [facility] [place], family was informed  about changes in patient condition at this time ,patient will be transfer to ICU when bed available.

## 2019-02-19 NOTE — TOC Initial Note (Signed)
Transition of Care [facility]) - Initial/Assessment Note    Patient Details  Name: Cathy Buck MRN: [id number] Date of Birth: 1941-05-15  [facility] CM/SW Contact:    Latanya Maudlin, RN Phone Number: 02/19/2019, 11:09 AM  Clinical Narrative:  [facility] team following as patient was recently discharged to [facility]. Patient likely to return. PT recommending SNF. FL2 done and submitted info through the [facility] to [facility]. Will plan on return to [facility] once medically cleared.                Expected Discharge Plan: Skilled Nursing Facility Barriers to Discharge: Continued Medical Work up   Patient Goals and CMS Choice     Choice offered to / list presented to : Patient  Expected Discharge Plan and Services Expected Discharge Plan: [place]   Discharge Planning Services: CM Consult   Living arrangements for the past 2 months: [place]                                      Prior Living Arrangements/Services Living arrangements for the past 2 months: [place] Lives with:: Facility Resident Patient language and need for interpreter reviewed:: Yes Do you feel safe going back to the place where you live?: Yes          Current home services: DME Criminal Activity/Legal Involvement Pertinent to Current Situation/Hospitalization: No - Comment as needed  Activities of Daily Living      Permission Sought/Granted Permission sought to share information with : Case Manager, Customer service manager                Emotional Assessment Appearance:: Appears stated age Attitude/Demeanor/Rapport: Unable to Assess Affect (typically observed): Unable to Assess Orientation: : Oriented to Self, Oriented to Place      Admission diagnosis:  Pleural effusion [J90] Primary malignant neoplasm of lung metastatic to other site, unspecified laterality ([place]) [C34.90] Altered mental status, unspecified altered mental status type  [R41.82] Patient Active Problem List   Diagnosis Date Noted  . Altered mental status 02/18/2019  . 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 Pleural effusion   . Weakness   . Hyponatremia 02/03/2019  . HTN (hypertension) 02/03/2019  . Diabetes ([place]) 02/03/2019   PCP:  Sallee Lange, NP Pharmacy:   [facility] [address], [place] - [place] [address] Phone: [phone] Fax: [phone]     Social Determinants of Health (SDOH) Interventions    Readmission Risk Interventions Readmission Risk Prevention Plan 02/19/2019  Transportation Screening Complete  PCP or Specialist Appt within 5-7 Days Complete  Home Care Screening Complete  Medication Review (RN CM) Complete  Some recent data might be hidden

## 2019-02-19 NOTE — Consult Note (Signed)
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  Previous admission few days ago, patient had severe hyponatremia, treated with 3% saline This time she presented with hypotension  Low sodium of 129, improved to 131 this morning Potassium 5.7 Patient is confused and delirious.  She is not able to provide any meaningful information. Transferred to ICU for severe resp failure On biPAP  Critically ill High chance of intubation and cardiac arrest She has end stage incurable Metastatic Lung cancer  PAST MEDICAL HISTORY :   has a past medical history of Diabetes mellitus without complication ([place]) and Hypertension.  has a past surgical history that includes Abdominal hysterectomy and Cholecystectomy. Prior to Admission medications   Medication Sig Start Date End Date Taking? Authorizing Provider  albuterol (VENTOLIN HFA) 108 (90 Base) MCG/ACT inhaler Inhale 1 puff into the lungs every 6 (six) hours as needed for wheezing or shortness of breath.   Yes [provider]  Amino Acids-Protein Hydrolys (FEEDING SUPPLEMENT, PRO-STAT SUGAR FREE 64,) LIQD Take 30 mLs by mouth daily.   Yes [provider]  atenolol (TENORMIN) 50 MG tablet Take 50 mg by mouth daily. 11/22/18  Yes [provider]  Calcium Carb-Cholecalciferol (CALCIUM-VITAMIN D) 500-200 MG-UNIT tablet Take 1 tablet by mouth 2 (two) times a day.   Yes [provider]  Multiple Vitamin (MULTIVITAMIN WITH MINERALS) TABS tablet Take 1 tablet by mouth daily.   Yes [provider]   omeprazole (PRILOSEC) 40 MG capsule Take 40 mg by mouth daily. 30 minutes before food and meds 01/18/19 01/18/20 Yes [provider]  polyethylene glycol (MIRALAX / GLYCOLAX) 17 g packet Take 17 g by mouth daily. 02/15/19  Yes Wieting, Richard, MD  vitamin C (ASCORBIC ACID) 250 MG tablet Take 250 mg by mouth 2 (two) times daily.   Yes [provider]   Allergies  Allergen Reactions  . Codeine Hives  . Atorvastatin Other (See Comments)  . Citalopram Other (See Comments)    Nausea  . Keflex [Cephalexin] Diarrhea  . Tylenol [Acetaminophen] Hives    FAMILY HISTORY:  family history is not on file. SOCIAL HISTORY:  reports that she has quit smoking. She has never used smokeless tobacco. She reports previous alcohol use. She reports previous drug use.  REVIEW OF SYSTEMS:   Unable to obtain due to critical illness   VITAL SIGNS: Temp:  [97.8 F (36.6 C)-98.6 F (37 C)] 98.4 F (36.9 C) (08/08 0741) Pulse Rate:  [49-145] 66 (08/08 0741) Resp:  [17-19] 18 (08/08 0741) BP: (96-127)/(57-99) 112/70 (08/08 0741) SpO2:  [90 %-100 %] 91 % (08/08 0741)   I/O last 3 completed shifts: In: 1341.6 [P.O.:240; I.V.:1.2; IV Piggyback:1100.3] Out: 450 [Urine:450] Total I/O In: -  Out: 902 [Urine:901; Stool:1]   SpO2: 91 % O2 Flow Rate (L/min): 4 L/min   Physical Examination:  GENERAL:critically ill appearing, +resp distress HEAD: Normocephalic, atraumatic.  EYES: Pupils equal, round, reactive to light.  No scleral icterus.  MOUTH: Moist mucosal membrane. NECK: Supple. No JVD.  PULMONARY: +rhonchi, +wheezing CARDIOVASCULAR: S1 and S2. Regular rate and rhythm. No murmurs, rubs, or gallops.  GASTROINTESTINAL: Soft, nontender, -distended. No masses. Positive bowel sounds. No hepatosplenomegaly.  MUSCULOSKELETAL: No swelling, clubbing, or edema.  NEUROLOGIC: obtunded SKIN:intact,warm,dry  I personally reviewed lab work that was obtained in last 24 hrs. CXR Independently  reviewed-RT lung opacity with effusion  MEDICATIONS: I have reviewed all medications and confirmed regimen as documented   CULTURE RESULTS   Recent Results (from the past 240 hour(s))  Novel Coronavirus, NAA (hospital order; send-out to ref lab)     Status: None   Collection Time: 02/11/19  5:15 PM   Specimen: Nasopharyngeal Swab; Respiratory  Result Value Ref Range Status   SARS-CoV-2, NAA NOT DETECTED NOT DETECTED Final    Comment: (NOTE) This test was developed and its performance characteristics determined by [employer]. This test has not been FDA cleared or approved. This test has been authorized by FDA under an Emergency Use Authorization (EUA). This test is only authorized for the duration of time the declaration that circumstances exist justifying the authorization of the emergency use of in vitro diagnostic tests for detection of SARS-CoV-2 virus and/or diagnosis of COVID-19 infection under section 564(b)(1) of the Act, 21 U.S.C. 094BSJ-6(G)(8), unless the authorization is terminated or revoked sooner. When diagnostic testing is negative, the possibility of a false negative result should be considered in the context of a patient's recent exposures and the presence of clinical signs and symptoms consistent with COVID-19. An individual without symptoms of COVID-19 and who is not shedding SARS-CoV-2 virus would expect to have a negative (not detected) result in this assay. Performed  At: [facility] [address] [place], [place] [id number] Rush Farmer MD YY:[id number]    [place]  Final    Comment: Performed at [facility], [address] [place], [place] [address]  Body fluid culture     Status: None   Collection Time: 02/14/19 10:30 AM   Specimen: [facility] Cytology Pleural fluid  Result Value Ref Range Status   Specimen Description   Final    PLEURAL Performed at [facility], [address]  [place], [place] [address]    Special Requests   Final    NONE Performed at [facility], [address] [place], [place] [address]    Gram Stain   Final    RARE WBC PRESENT, PREDOMINANTLY MONONUCLEAR NO ORGANISMS SEEN    Culture   Final    NO GROWTH 3 DAYS Performed at [facility], [place] [address], [place], [place] [address]    Report Status 02/17/2019 FINAL  Final  SARS Coronavirus 2 [facility] order, Performed in [facility] hospital lab) Nasopharyngeal Nasopharyngeal Swab     Status: None   Collection Time: 02/14/19  1:17 PM   Specimen: Nasopharyngeal Swab  Result Value Ref Range Status   SARS Coronavirus 2 NEGATIVE NEGATIVE Final    Comment: (NOTE) If result is NEGATIVE SARS-CoV-2 target nucleic acids are NOT DETECTED. The SARS-CoV-2 RNA is generally detectable in upper and lower  respiratory specimens during the acute phase of infection. The lowest  concentration of SARS-CoV-2 viral copies this assay can detect is 250  copies / mL. A negative result does not preclude SARS-CoV-2 infection  and should not be used as the sole basis for treatment or other  patient management decisions.  A negative result may occur with  improper specimen collection / handling, submission of specimen other  than nasopharyngeal swab, presence of viral mutation(s) within the  areas targeted by this assay, and inadequate  number of viral copies  (<250 copies / mL). A negative result must be combined with clinical  observations, patient history, and epidemiological information. If result is POSITIVE SARS-CoV-2 target nucleic acids are DETECTED. The SARS-CoV-2 RNA is generally detectable in upper and lower  respiratory specimens dur ing the acute phase of infection.  Positive  results are indicative of active infection with SARS-CoV-2.  Clinical  correlation with patient history and other diagnostic information is  necessary to determine patient infection status.  Positive results do   not rule out bacterial infection or co-infection with other viruses. If result is PRESUMPTIVE POSTIVE SARS-CoV-2 nucleic acids MAY BE PRESENT.   A presumptive positive result was obtained on the submitted specimen  and confirmed on repeat testing.  While 2019 novel coronavirus  (SARS-CoV-2) nucleic acids may be present in the submitted sample  additional confirmatory testing may be necessary for epidemiological  and / or clinical management purposes  to differentiate between  SARS-CoV-2 and other Sarbecovirus currently known to infect humans.  If clinically indicated additional testing with an alternate test  methodology [phone]) is advised. The SARS-CoV-2 RNA is generally  detectable in upper and lower respiratory sp ecimens during the acute  phase of infection. The expected result is Negative. Fact Sheet for Patients:  StrictlyIdeas.no Fact Sheet for Healthcare Providers: BankingDealers.co.za This test is not yet approved or cleared by the [place] FDA and has been authorized for detection and/or diagnosis of SARS-CoV-2 by FDA under an Emergency Use Authorization (EUA).  This EUA will remain in effect (meaning this test can be used) for the duration of the COVID-19 declaration under Section 564(b)(1) of the Act, 21 U.S.C. section 360bbb-3(b)(1), unless the authorization is terminated or revoked sooner. Performed at [facility], [address] [place], [place] [address]   Culture, blood (routine x 2)     Status: None (Preliminary result)   Collection Time: 02/18/19  9:41 AM   Specimen: BLOOD  Result Value Ref Range Status   Specimen Description BLOOD LEFT ANTECUBITAL  Final   Special Requests   Final    BOTTLES DRAWN AEROBIC AND ANAEROBIC Blood Culture results may not be optimal due to an excessive volume of blood received in culture bottles   Culture  Setup Time   Final    Organism ID to follow GRAM NEGATIVE RODS  IN BOTH AEROBIC AND ANAEROBIC BOTTLES CRITICAL RESULT CALLED TO, READ BACK BY AND VERIFIED WITH: JASON ROBBINS @ 2107 ON 02/18/2019 BY CAF Performed at [facility], [address] [place],  [address]    Culture GRAM NEGATIVE RODS  Final   Report Status PENDING  Incomplete  Blood Culture ID Panel (Reflexed)     Status: Abnormal   Collection Time: 02/18/19  9:41 AM  Result Value Ref Range Status   Enterococcus species NOT DETECTED NOT DETECTED Final   Listeria monocytogenes NOT DETECTED NOT DETECTED Final   Staphylococcus species NOT DETECTED NOT DETECTED Final   Staphylococcus aureus (BCID) NOT DETECTED NOT DETECTED Final   Streptococcus species NOT DETECTED NOT DETECTED Final   Streptococcus agalactiae NOT DETECTED NOT DETECTED Final   Streptococcus pneumoniae NOT DETECTED NOT DETECTED Final   Streptococcus pyogenes NOT DETECTED NOT DETECTED Final   Acinetobacter baumannii NOT DETECTED NOT DETECTED Final   Enterobacteriaceae species DETECTED (A) NOT DETECTED Final    Comment: Enterobacteriaceae represent a large family of gram-negative bacteria, not a single organism. CRITICAL RESULT CALLED TO, READ BACK BY AND VERIFIED WITH: JASON  ROBBINS @ 2107 ON 02/18/2019 BY CAF    Enterobacter cloacae complex NOT DETECTED NOT DETECTED Final   Escherichia coli NOT DETECTED NOT DETECTED Final   Klebsiella oxytoca NOT DETECTED NOT DETECTED Final   Klebsiella pneumoniae NOT DETECTED NOT DETECTED Final   Proteus species DETECTED (A) NOT DETECTED Final    Comment: CRITICAL RESULT CALLED TO, READ BACK BY AND VERIFIED WITH: JASON ROBBINS @ 2107 ON 02/18/2019 BY CAF    Serratia marcescens NOT DETECTED NOT DETECTED Final   Carbapenem resistance NOT DETECTED NOT DETECTED Final   Haemophilus influenzae NOT DETECTED NOT DETECTED Final   Neisseria meningitidis NOT DETECTED NOT DETECTED Final   Pseudomonas aeruginosa NOT DETECTED NOT DETECTED Final   Candida albicans NOT DETECTED NOT  DETECTED Final   Candida glabrata NOT DETECTED NOT DETECTED Final   Candida krusei NOT DETECTED NOT DETECTED Final   Candida parapsilosis NOT DETECTED NOT DETECTED Final   Candida tropicalis NOT DETECTED NOT DETECTED Final    Comment: Performed at [facility], [address] [place], [place] [address]  SARS Coronavirus 2 [facility] order, Performed in [facility] hospital lab) Nasopharyngeal Nasopharyngeal Swab     Status: None   Collection Time: 02/18/19 10:18 AM   Specimen: Nasopharyngeal Swab  Result Value Ref Range Status   SARS Coronavirus 2 NEGATIVE NEGATIVE Final    Comment: (NOTE) If result is NEGATIVE SARS-CoV-2 target nucleic acids are NOT DETECTED. The SARS-CoV-2 RNA is generally detectable in upper and lower  respiratory specimens during the acute phase of infection. The lowest  concentration of SARS-CoV-2 viral copies this assay can detect is 250  copies / mL. A negative result does not preclude SARS-CoV-2 infection  and should not be used as the sole basis for treatment or other  patient management decisions.  A negative result may occur with  improper specimen collection / handling, submission of specimen other  than nasopharyngeal swab, presence of viral mutation(s) within the  areas targeted by this assay, and inadequate number of viral copies  (<250 copies / mL). A negative result must be combined with clinical  observations, patient history, and epidemiological information. If result is POSITIVE SARS-CoV-2 target nucleic acids are DETECTED. The SARS-CoV-2 RNA is generally detectable in upper and lower  respiratory specimens dur ing the acute phase of infection.  Positive  results are indicative of active infection with SARS-CoV-2.  Clinical  correlation with patient history and other diagnostic information is  necessary to determine patient infection status.  Positive results do  not rule out bacterial infection or co-infection with other viruses. If  result is PRESUMPTIVE POSTIVE SARS-CoV-2 nucleic acids MAY BE PRESENT.   A presumptive positive result was obtained on the submitted specimen  and confirmed on repeat testing.  While 2019 novel coronavirus  (SARS-CoV-2) nucleic acids may be present in the submitted sample  additional confirmatory testing may be necessary for epidemiological  and / or clinical management purposes  to differentiate between  SARS-CoV-2 and other Sarbecovirus currently known to infect humans.  If clinically indicated additional testing with an alternate test  methodology [phone]) is advised. The SARS-CoV-2 RNA is generally  detectable in upper and lower respiratory sp ecimens during the acute  phase of infection. The expected result is Negative. Fact Sheet for Patients:  StrictlyIdeas.no Fact Sheet for Healthcare Providers: BankingDealers.co.za This test is not yet approved or cleared by the [place] FDA and has been authorized for detection and/or diagnosis of SARS-CoV-2 by FDA under an  Emergency Use Authorization (EUA).  This EUA will remain in effect (meaning this test can be used) for the duration of the COVID-19 declaration under Section 564(b)(1) of the Act, 21 U.S.C. section 360bbb-3(b)(1), unless the authorization is terminated or revoked sooner. Performed at [facility], [address] [place], [place] [address]   Culture, blood (routine x 2)     Status: None (Preliminary result)   Collection Time: 02/18/19 10:28 AM   Specimen: BLOOD  Result Value Ref Range Status   Specimen Description   Final    BLOOD RIGHT ANTECUBITAL Performed at [facility], [place] [address], [place], [place] [address]    Special Requests   Final    BOTTLES DRAWN AEROBIC AND ANAEROBIC Blood Culture adequate volume   Culture  Setup Time   Final    GRAM NEGATIVE RODS IN BOTH AEROBIC AND ANAEROBIC BOTTLES CRITICAL VALUE NOTED.  VALUE IS CONSISTENT WITH  PREVIOUSLY REPORTED AND CALLED VALUE.    Culture   Final    NO GROWTH < 24 HOURS Performed at [facility], [place], [place] [address]    Report Status PENDING  Incomplete          IMAGING    Mr Brain Wo Contrast  Result Date: 02/18/2019 CLINICAL DATA:  Altered level of consciousness with change in speech. History of hypertension diabetes and lung cancer. EXAM: MRI HEAD WITHOUT CONTRAST TECHNIQUE: Multiplanar, multiecho pulse sequences of the brain and surrounding structures were obtained without intravenous contrast. COMPARISON:  CT head 02/18/2019 FINDINGS: Brain: Motion degraded study. Rapid scanning utilized due to motion. Negative for acute infarct. Negative for mass or edema. Ventricle size normal. No hemorrhage or fluid collection. Scattered small white matter hyperintensities bilaterally. Vascular: Normal arterial flow voids Skull and upper cervical spine: Negative Sinuses/Orbits: Negative Other: None IMPRESSION: Motion degraded study No acute abnormality. Minimal white matter disease which appears chronic and unchanged from prior MRI Electronically Signed   By: Franchot Gallo M.D.   On: 02/18/2019 22:07   Dg Chest Port 1 View  Result Date: 02/19/2019 CLINICAL DATA:  Shortness of breath. EXAM: PORTABLE CHEST 1 VIEW COMPARISON:  02/18/2019 FINDINGS: Stable heart size and aortic tortuosity. Increased atelectasis of the right lower lung with probable component of moderate right pleural effusion. No overt pulmonary edema. No pneumothorax. IMPRESSION: Increased atelectasis of the right lung with probable component of moderate right pleural effusion. Electronically Signed   By: Aletta Edouard M.D.   On: 02/19/2019 12:56       ASSESSMENT AND PLAN SYNOPSIS   Severe ACUTE Hypoxic and Hypercapnic Respiratory Failure from metastatic lung cancer end stage cancer  Continue biPAP High risk for intubation -continue Bronchodilator Therapy   RT sided opacity Plan  for thoracentesis in next 24-48 hrs   NEUROLOGY-severe encephalopathy from acidosis  CARDIAC ICU monitoring  ID -continue IV abx as prescibed -follow up cultures  GI GI PROPHYLAXIS as indicated  NUTRITIONAL STATUS DIET-->TF's as tolerated Constipation protocol as indicated   ENDO - will use ICU hypoglycemic\Hyperglycemia protocol if needed   ELECTROLYTES -follow labs as needed -replace as needed -pharmacy consultation and following   DVT/GI PRX ordered TRANSFUSIONS AS NEEDED MONITOR FSBS ASSESS the need for LABS    Critical Care Time devoted to patient care services described in this note is 37 minutes.   Overall, patient is critically ill, prognosis is guarded.  Patient with Multiorgan failure and at high risk for cardiac arrest and death.  Recommend palliative care consultation Recommend DNR/DNI status  Family aware of ICU admission   Tajanae Guilbault Patricia Pesa, M.D.  [facility]monary & Critical [facility] Medicine  Medical Director [place] [facility] Cardio-Pulmonary Department

## 2019-02-19 NOTE — Progress Notes (Signed)
Family At bedside, clinical status relayed to family  Updated and notified of patients medical condition-  Progressive multiorgan failure with very low chance of meaningful recovery.  Patient is in dying  Process.  Family understands the situation.  They have consented and agreed to DNR/DNI  Will continue biPAP and medical therapy in setting of severe ned stage lung cancer  Family are satisfied with Plan of action and management. All questions answered  Corrin Parker, M.D.  [facility]  Medical Director [place] [facility] Cardio-Pulmonary Department

## 2019-02-20 DIAGNOSIS — R400 Somnolence: Secondary | ICD-10-CM

## 2019-02-20 LAB — PHOSPHORUS
Phosphorus: 1.2 mg/dL — ABNORMAL LOW (ref 2.5–4.6)
Phosphorus: 1.5 mg/dL — ABNORMAL LOW (ref 2.5–4.6)

## 2019-02-20 LAB — CBC
HCT: 32.1 % — ABNORMAL LOW (ref 36.0–46.0)
Hemoglobin: 9.8 g/dL — ABNORMAL LOW (ref 12.0–15.0)
MCH: 23.7 pg — ABNORMAL LOW (ref 26.0–34.0)
MCHC: 30.5 g/dL (ref 30.0–36.0)
MCV: 77.7 fL — ABNORMAL LOW (ref 80.0–100.0)
Platelets: 219 10*3/uL (ref 150–400)
RBC: 4.13 MIL/uL (ref 3.87–5.11)
RDW: 15.1 % (ref 11.5–15.5)
WBC: 11.2 10*3/uL — ABNORMAL HIGH (ref 4.0–10.5)
nRBC: 0.3 % — ABNORMAL HIGH (ref 0.0–0.2)

## 2019-02-20 LAB — BASIC METABOLIC PANEL
Anion gap: 10 (ref 5–15)
BUN: 36 mg/dL — ABNORMAL HIGH (ref 8–23)
CO2: 37 mmol/L — ABNORMAL HIGH (ref 22–32)
Calcium: 9.2 mg/dL (ref 8.9–10.3)
Chloride: 92 mmol/L — ABNORMAL LOW (ref 98–111)
Creatinine, Ser: 0.44 mg/dL (ref 0.44–1.00)
GFR calc Af Amer: >60 mL/min (ref >60)
GFR calc non Af Amer: >60 mL/min (ref >60)
Glucose, Bld: 169 mg/dL — ABNORMAL HIGH (ref 70–99)
Potassium: 4 mmol/L (ref 3.5–5.1)
Sodium: 139 mmol/L (ref 135–145)

## 2019-02-20 LAB — MAGNESIUM: Magnesium: 2.3 mg/dL (ref 1.7–2.4)

## 2019-02-20 MED ORDER — SODIUM PHOSPHATES 45 MMOLE/15ML IV SOLN
30.0000 mmol | Freq: Once | INTRAVENOUS | Status: AC
  Administered 2019-02-20: 22:00:00 30 mmol via INTRAVENOUS
  Filled 2019-02-20: qty 10

## 2019-02-20 MED ORDER — AMIODARONE LOAD VIA INFUSION
150.0000 mg | Freq: Once | INTRAVENOUS | Status: DC
  Filled 2019-02-20: qty 83.34

## 2019-02-20 MED ORDER — SODIUM PHOSPHATES 45 MMOLE/15ML IV SOLN
30.0000 mmol | Freq: Once | INTRAVENOUS | Status: AC
  Administered 2019-02-20: 10:00:00 30 mmol via INTRAVENOUS
  Filled 2019-02-20: qty 10

## 2019-02-20 NOTE — Progress Notes (Signed)
PHARMACY CONSULT NOTE - FOLLOW UP  Pharmacy Consult for Electrolyte Monitoring and Replacement   Recent Labs: Potassium (mmol/L)  Date Value  02/20/2019 4.0   Magnesium (mg/dL)  Date Value  02/20/2019 2.3   Calcium (mg/dL)  Date Value  02/20/2019 9.2   Albumin (g/dL)  Date Value  02/18/2019 2.9 (L)   Phosphorus (mg/dL)  Date Value  02/20/2019 1.5 (L)   Sodium (mmol/L)  Date Value  02/20/2019 139     Assessment:Recent hyperkalemia now resolved with SPS  -Recent hyponatremia resolved -Hypophosphatemia on AM labs   Goal of Therapy:  Replete electrolytes to normal levels  Plan:  Administer 30 mmol of sodium phosphate via IV route  8/9:  Phos @ 1825 = 1.5 Will order Sodium phosphate 30 mmol IV X 1 and recheck electrolytes on 8/10 with AM labs.   Orene Desanctis ,PharmD Clinical Pharmacist 02/20/2019 8:10 PM

## 2019-02-20 NOTE — Consult Note (Signed)
PHARMACY CONSULT NOTE - FOLLOW UP  Pharmacy Consult for Electrolyte Monitoring and Replacement   Recent Labs: Potassium (mmol/L)  Date Value  02/20/2019 4.0   Magnesium (mg/dL)  Date Value  02/20/2019 2.3   Calcium (mg/dL)  Date Value  02/20/2019 9.2   Albumin (g/dL)  Date Value  02/18/2019 2.9 (L)   Phosphorus (mg/dL)  Date Value  02/20/2019 1.2 (L)   Sodium (mmol/L)  Date Value  02/20/2019 139     Assessment: -Recent hyperkalemia now resolved with SPS  -Recent hyponatremia resolved -Hypophosphatemia on AM labs   Goal of Therapy:  Replete electrolytes to normal levels  Plan:  Administer 30 mmol of sodium phosphate via IV route  Benita Gutter ,PharmD Pharmacy Resident 02/20/2019 7:59 AM

## 2019-02-20 NOTE — Progress Notes (Signed)
Patient had a run of ST during the night and was about to give an Amiodarone bolus, but dropped back to 70s 80s. Continue on bipap at 45%

## 2019-02-20 NOTE — Progress Notes (Signed)
[facility] Hematology/Oncology Progress Note  Date of admission: 02/18/2019  Hospital day:  02/20/2019  Chief Complaint: Cathy Buck is a 78 y.o. female with stage IV adenocarcinoma of the lung who was admitted with altered mental status and new onset atrial fibrillation with RVR.  Subjective: Patient in the ICU feeling much better.  She is conversant on nasal cannulae.  Social History: The patient is alone today.  She does not remember being at [facility] prior to her admission.  Allergies:  Allergies  Allergen Reactions  . Codeine Hives  . Atorvastatin Other (See Comments)  . Citalopram Other (See Comments)    Nausea  . Keflex [Cephalexin] Diarrhea  . Tylenol [Acetaminophen] Hives    Scheduled Medications: . amiodarone  150 mg Intravenous Once  . budesonide (PULMICORT) nebulizer solution  0.5 mg Nebulization BID  . calcium-vitamin D  1 tablet Oral Q breakfast  . enoxaparin (LOVENOX) injection  100 mg Subcutaneous BID  . feeding supplement (PRO-STAT SUGAR FREE 64)  30 mL Oral Daily  . ipratropium-albuterol  3 mL Nebulization Q4H  . methylPREDNISolone (SOLU-MEDROL) injection  40 mg Intravenous Q12H  . multivitamin with minerals  1 tablet Oral Daily  . pantoprazole  40 mg Oral Daily  . polyethylene glycol  17 g Oral Daily  . vitamin C  250 mg Oral BID    Review of Systems: GENERAL:  Feels "better".  Does not remember recent events leading up to hospitalization. Denies any fevers or chills. PERFORMANCE STATUS (ECOG):  2 HEENT:  No visual changes, runny nose, sore throat, mouth sores or tenderness. Lungs:  Shortness of breath with exertion.  No cough.  No hemoptysis. Cardiac:  No chest pain, palpitations, orthopnea, or PND. GI:  No nausea, vomiting, diarrhea, constipation, melena or hematochezia. GU:  No urgency, frequency, dysuria, or hematuria. Musculoskeletal:  No back pain.  No joint pain.  No muscle tenderness. Extremities:  No pain or  swelling. Skin:  No rashes or skin changes. Neuro:  No headache, numbness or weakness, balance or coordination issues. Endocrine:  No diabetes, thyroid issues, hot flashes or night sweats. Psych:  No mood changes, depression or anxiety. Pain:  No focal pain. Review of systems:  All other systems reviewed and found to be negative.  Physical Exam: Blood pressure (!) 144/85, pulse 95, temperature 98.2 F (36.8 C), temperature source Oral, resp. rate 20, weight 226 lb 10.1 oz (102.8 kg), SpO2 93 %.  GENERAL:  Elderly woman sitting up in the ICU in no acute distress. MENTAL STATUS:  Alert and oriented to person and place.  She states that it is May 2002. HEAD:  Graying hair.  Normocephalic, atraumatic, face symmetric, no Cushingoid features. EYES:  Brown eyes.  Pupils equal round and reactive to light and accomodation.  No conjunctivitis or scleral icterus. ENT:  Oropharynx clear without lesion.  No lower teeth.  Few upper teeth.  Tongue normal. Mucous membranes moist.  RESPIRATORY:  Decreased breath sounds right base.  Otherwise, clear to auscultation without rales, wheezes or rhonchi. CARDIOVASCULAR:  Tachycardia.  Regular rate and rhythm without murmur, rub or gallop. ABDOMEN:  Soft, non-tender, with active bowel sounds, and no hepatosplenomegaly.  No masses. SKIN:  No rashes, ulcers or lesions. EXTREMITIES: No edema, no skin discoloration or tenderness.  No palpable cords. NEUROLOGICAL: Conversant.  Follows all commands.  Moves all 4 extremities. PSYCH:  Appropriate.   Results for orders placed or performed during the hospital encounter of 02/18/19 (from the past  48 hour(s))  Glucose, capillary     Status: Abnormal   Collection Time: 02/18/19  5:22 PM  Result Value Ref Range   Glucose-Capillary 100 (H) 70 - 99 mg/dL  Glucose, capillary     Status: Abnormal   Collection Time: 02/18/19  8:45 PM  Result Value Ref Range   Glucose-Capillary 123 (H) 70 - 99 mg/dL  Urinalysis, Complete w  Microscopic     Status: Abnormal   Collection Time: 02/18/19 11:35 PM  Result Value Ref Range   Color, Urine AMBER (A) YELLOW    Comment: BIOCHEMICALS MAY BE AFFECTED BY COLOR   APPearance CLOUDY (A) CLEAR   Specific Gravity, Urine 1.016 1.005 - 1.030   pH 8.0 5.0 - 8.0   Glucose, UA NEGATIVE NEGATIVE mg/dL   Hgb urine dipstick SMALL (A) NEGATIVE   Bilirubin Urine NEGATIVE NEGATIVE   Ketones, ur NEGATIVE NEGATIVE mg/dL   Protein, ur 100 (A) NEGATIVE mg/dL   Nitrite NEGATIVE NEGATIVE   Leukocytes,Ua LARGE (A) NEGATIVE   RBC / HPF 6-10 0 - 5 RBC/hpf   WBC, UA >50 (H) 0 - 5 WBC/hpf   Bacteria, UA RARE (A) NONE SEEN   Squamous Epithelial / LPF 0-5 0 - 5    Comment: Performed at [facility], [address], [place], [place] [address]  Urine culture     Status: Abnormal (Preliminary result)   Collection Time: 02/18/19 11:35 PM   Specimen: Urine, Random  Result Value Ref Range   Specimen Description      URINE, RANDOM Performed at [facility], [address] [place], [place] [address]    Special Requests      NONE Performed at [facility], [address], [place], [place] [address]    Culture (A)     >=100,000 COLONIES/mL KLEBSIELLA ORNITHINOLYTICA SUSCEPTIBILITIES TO FOLLOW Performed at [facility], [place] [address], [place], [place] [address]    Report Status PENDING   Urine Drug Screen, Qualitative     Status: None   Collection Time: 02/18/19 11:35 PM  Result Value Ref Range   Tricyclic, Ur Screen NONE DETECTED NONE DETECTED   Amphetamines, Ur Screen NONE DETECTED NONE DETECTED   MDMA (Ecstasy)Ur Screen NONE DETECTED NONE DETECTED   Cocaine Metabolite,Ur [place] NONE DETECTED NONE DETECTED   Opiate, Ur Screen NONE DETECTED NONE DETECTED   Phencyclidine (PCP) Ur S NONE DETECTED NONE DETECTED   Cannabinoid 50 Ng, Ur [place] NONE DETECTED NONE DETECTED   Barbiturates, Ur Screen NONE DETECTED NONE DETECTED   Benzodiazepine, Ur Scrn NONE DETECTED  NONE DETECTED   Methadone Scn, Ur NONE DETECTED NONE DETECTED    Comment: (NOTE) Tricyclics + metabolites, urine    Cutoff 1000 ng/mL Amphetamines + metabolites, urine  Cutoff 1000 ng/mL MDMA (Ecstasy), urine              Cutoff 500 ng/mL Cocaine Metabolite, urine          Cutoff 300 ng/mL Opiate + metabolites, urine        Cutoff 300 ng/mL Phencyclidine (PCP), urine         Cutoff 25 ng/mL Cannabinoid, urine                 Cutoff 50 ng/mL Barbiturates + metabolites, urine  Cutoff 200 ng/mL Benzodiazepine, urine              Cutoff 200 ng/mL Methadone, urine  Cutoff 300 ng/mL The urine drug screen provides only a preliminary, unconfirmed analytical test result and should not be used for non-medical purposes. Clinical consideration and professional judgment should be applied to any positive drug screen result due to possible interfering substances. A more specific alternate chemical method must be used in order to obtain a confirmed analytical result. Gas chromatography / mass spectrometry (GC/MS) is the preferred confirmat ory method. Performed at [facility], [address] [place], [place] [address]   Basic metabolic panel     Status: Abnormal   Collection Time: 02/19/19  6:29 AM  Result Value Ref Range   Sodium 131 (L) 135 - 145 mmol/L   Potassium 5.7 (H) 3.5 - 5.1 mmol/L   Chloride 89 (L) 98 - 111 mmol/L   CO2 29 22 - 32 mmol/L   Glucose, Bld 114 (H) 70 - 99 mg/dL   BUN 53 (H) 8 - 23 mg/dL   Creatinine, Ser 0.79 0.44 - 1.00 mg/dL   Calcium 9.4 8.9 - 10.3 mg/dL   GFR calc non Af Amer >60 >60 mL/min   GFR calc Af Amer >60 >60 mL/min   Anion gap 13 5 - 15    Comment: Performed at [facility], [address] [place], [place] [address]  Glucose, capillary     Status: Abnormal   Collection Time: 02/19/19  7:46 AM  Result Value Ref Range   Glucose-Capillary 120 (H) 70 - 99 mg/dL  Glucose, capillary     Status: Abnormal   Collection  Time: 02/19/19 11:32 AM  Result Value Ref Range   Glucose-Capillary 148 (H) 70 - 99 mg/dL  Blood gas, arterial     Status: Abnormal   Collection Time: 02/19/19 12:09 PM  Result Value Ref Range   FIO2 0.44    Delivery systems NASAL CANNULA    pH, Arterial 7.37 7.350 - 7.450   pCO2 arterial 78 (HH) 32.0 - 48.0 mmHg    Comment: CRITICAL RESULT CALLED TO, READ BACK BY AND VERIFIED WITH: CRITICAL VALUE 02/19/19 1230 DR. GOURU/FD    pO2, Arterial 81 (L) 83.0 - 108.0 mmHg   Bicarbonate 45.1 (H) 20.0 - 28.0 mmol/L   Acid-Base Excess 15.8 (H) 0.0 - 2.0 mmol/L   O2 Saturation 95.5 %   Patient temperature 37.0    Collection site RIGHT BRACHIAL    Sample type ARTERIAL DRAW     Comment: Performed at [facility], [address] [place], [place] [address]  Glucose, capillary     Status: Abnormal   Collection Time: 02/19/19  2:15 PM  Result Value Ref Range   Glucose-Capillary 148 (H) 70 - 99 mg/dL  Potassium     Status: None   Collection Time: 02/19/19  6:01 PM  Result Value Ref Range   Potassium 4.2 3.5 - 5.1 mmol/L    Comment: Performed at [facility], [address] [place], [place] [address]  Basic metabolic panel     Status: Abnormal   Collection Time: 02/20/19  4:22 AM  Result Value Ref Range   Sodium 139 135 - 145 mmol/L   Potassium 4.0 3.5 - 5.1 mmol/L   Chloride 92 (L) 98 - 111 mmol/L   CO2 37 (H) 22 - 32 mmol/L   Glucose, Bld 169 (H) 70 - 99 mg/dL   BUN 36 (H) 8 - 23 mg/dL   Creatinine, Ser 0.44 0.44 - 1.00 mg/dL   Calcium 9.2 8.9 - 10.3 mg/dL   GFR calc  non Af Amer >60 >60 mL/min   GFR calc Af Amer >60 >60 mL/min   Anion gap 10 5 - 15    Comment: Performed at [facility], [address] [place], [place] [address]  CBC     Status: Abnormal   Collection Time: 02/20/19  4:22 AM  Result Value Ref Range   WBC 11.2 (H) 4.0 - 10.5 K/uL   RBC 4.13 3.87 - 5.11 MIL/uL   Hemoglobin 9.8 (L) 12.0 - 15.0 g/dL   HCT 32.1 (L) 36.0 - 46.0 %   MCV 77.7  (L) 80.0 - 100.0 fL   MCH 23.7 (L) 26.0 - 34.0 pg   MCHC 30.5 30.0 - 36.0 g/dL   RDW 15.1 11.5 - 15.5 %   Platelets 219 150 - 400 K/uL   nRBC 0.3 (H) 0.0 - 0.2 %    Comment: Performed at [facility], [address] [place], [place] [address]  Magnesium     Status: None   Collection Time: 02/20/19  4:22 AM  Result Value Ref Range   Magnesium 2.3 1.7 - 2.4 mg/dL    Comment: Performed at [facility], [address], [place], [place] [address]  Phosphorus     Status: Abnormal   Collection Time: 02/20/19  4:22 AM  Result Value Ref Range   Phosphorus 1.2 (L) 2.5 - 4.6 mg/dL    Comment: Performed at [facility], [address] [place], [place] [address]   Mr Brain Wo Contrast  Result Date: 02/18/2019 CLINICAL DATA:  Altered level of consciousness with change in speech. History of hypertension diabetes and lung cancer. EXAM: MRI HEAD WITHOUT CONTRAST TECHNIQUE: Multiplanar, multiecho pulse sequences of the brain and surrounding structures were obtained without intravenous contrast. COMPARISON:  CT head 02/18/2019 FINDINGS: Brain: Motion degraded study. Rapid scanning utilized due to motion. Negative for acute infarct. Negative for mass or edema. Ventricle size normal. No hemorrhage or fluid collection. Scattered small white matter hyperintensities bilaterally. Vascular: Normal arterial flow voids Skull and upper cervical spine: Negative Sinuses/Orbits: Negative Other: None IMPRESSION: Motion degraded study No acute abnormality. Minimal white matter disease which appears chronic and unchanged from prior MRI Electronically Signed   By: Franchot Gallo M.D.   On: 02/18/2019 22:07   Dg Chest Port 1 View  Result Date: 02/19/2019 CLINICAL DATA:  Shortness of breath. EXAM: PORTABLE CHEST 1 VIEW COMPARISON:  02/18/2019 FINDINGS: Stable heart size and aortic tortuosity. Increased atelectasis of the right lower lung with probable component of moderate right pleural effusion. No  overt pulmonary edema. No pneumothorax. IMPRESSION: Increased atelectasis of the right lung with probable component of moderate right pleural effusion. Electronically Signed   By: Aletta Edouard M.D.   On: 02/19/2019 12:56    Assessment:  Cathy Buck is a 78 y.o. female with with metastatic adenocarcinoma of the lung admitted with altered mental status and atrial fibrillation with RVR.    She was retaining CO2 and thus transferred to the ICU.  She is off BiPAP and is now on nasal cannulae.    She has a UTI.  Blood culture ID Enterobacteriaceae.  Symptomatically, she has improve dramatically overnight.  She is conversant and off BiPAP.  Exam reveals a right sided effusion.  Plan:   1.   Oncology             Patient recently diagnosed with adenocarcinoma of the lung s/p thoracentesis.  She was scheduled to discuss her treatment plan with Dr. Tasia Catchings on Friday, 02/18/2019.                           Current plan is for concurrent chemotherapy and immunotherapy.               Anticipate PET scan in the outpatient department after discharge (currently scheduled for 02/22/2019).  2.  Pulmonary             CXR reveals recurrent right-sided effusion and right base atelectasis/infiltrate.             Patient off BiPAP and on nasal cannulae.  Patient may require repeat thoracentesis. 3.  Infectious disease             Patient pancultured.  COVID-19 negative.             Urine culture reveals > 100,000 colonies of Klesiella ornithinolytica.  Blood culture on 02/18/2019 revealed Proteus mirabilis in both aerobic and anaerobic bottles.             Patient currently on ceftriaxone. 4.  Nephrology             Sodium has improved from 129 to 139.  Creatinine 0.44. 5.   Cardiology  Patient with new onset atrial fibrillation with RVR.  Patient hemodynamically stable. 6.   Altered mental status             Mental status nearly back to baseline with treatment of infection and management of CO2  retention.   Lequita Asal, MD  02/20/2019, 4:51 PM

## 2019-02-20 NOTE — Progress Notes (Signed)
Pt taken off bipap and placed on 5lpm [place]. Tolerating well at this time. Pt stated that it fills better off. Will continue to monitor. RN notified.

## 2019-02-20 NOTE — Progress Notes (Signed)
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  CC  follow up respiratory failure  SUBJECTIVE Patient remains critically ill Prognosis is guarded On biPAP    BP 118/61   Pulse 82   Temp 99.2 F (37.3 C) (Axillary)   Resp (!) 21   Wt 102.8 kg   SpO2 96%   BMI 38.90 kg/m    I/O last 3 completed shifts: In: 1128.6 [I.V.:428.3; IV Piggyback:700.3] Out: 2452 [Urine:2451; Stool:1] No intake/output data recorded.  SpO2: 96 % O2 Flow Rate (L/min): 4 L/min FiO2 (%): 40 %      REVIEW OF SYSTEMS  PATIENT IS UNABLE TO PROVIDE COMPLETE REVIEW OF SYSTEMS DUE TO SEVERE CRITICAL ILLNESS   PHYSICAL EXAMINATION:  GENERAL:critically ill appearing, +resp distress HEAD: Normocephalic, atraumatic.  NECK: Supple. No thyromegaly. No nodules. No JVD.  PULMONARY: +rhonchi, +wheezing CARDIOVASCULAR: S1 and S2. Regular rate and rhythm. No murmurs, rubs, or gallops.  GASTROINTESTINAL: Soft, nontender, -distended. No masses. Positive bowel sounds. No hepatosplenomegaly.  MUSCULOSKELETAL: +edema.  NEUROLOGIC: lethargic SKIN:intact,warm,dry  MEDICATIONS: I have reviewed all medications and confirmed regimen as documented   CULTURE RESULTS   Recent Results (from the past 240 hour(s))  Novel Coronavirus, NAA (hospital order; send-out to ref lab)     Status: None   Collection Time: 02/11/19  5:15 PM   Specimen: Nasopharyngeal Swab; Respiratory  Result Value Ref Range Status   SARS-CoV-2, NAA NOT DETECTED NOT DETECTED Final    Comment: (NOTE) This test was developed and its performance characteristics determined by [employer]. This test has not been FDA cleared or approved. This test has been authorized  by FDA under an Emergency Use Authorization (EUA). This test is only authorized for the duration of time the declaration that circumstances exist justifying the authorization of the emergency use of in vitro diagnostic tests for detection of SARS-CoV-2 virus and/or diagnosis of COVID-19 infection under section 564(b)(1) of the Act, 21 U.S.C. 588FOY-7(X)(4), unless the authorization is terminated or revoked sooner. When diagnostic testing is negative, the possibility of a false negative result should be considered in the context of a patient's recent exposures and the presence of clinical signs and symptoms consistent with COVID-19. An individual without symptoms of COVID-19 and who is not shedding SARS-CoV-2 virus would expect to have a negative (not detected) result in this assay. Performed  At: [facility] [address] [place], [place] [id number] Rush Farmer MD MC:[id number]    [place]  Final    Comment: Performed at [facility], [address] [place], [place] [address]  Body fluid culture     Status: None   Collection Time: 02/14/19 10:30 AM   Specimen: [facility] Cytology Pleural fluid  Result Value Ref Range Status   Specimen Description   Final    PLEURAL Performed at [facility], [address], [place], [place] [address]    Special Requests   Final    NONE Performed at [facility], [address] [place], [place] [address]    Gram Stain   Final    RARE WBC PRESENT, PREDOMINANTLY MONONUCLEAR NO ORGANISMS SEEN    Culture   Final    NO GROWTH 3 DAYS Performed at [facility], [place] [address], [place],  [address]  Report Status 02/17/2019 FINAL  Final  SARS Coronavirus 2 [facility] order, Performed in [facility] hospital lab) Nasopharyngeal Nasopharyngeal Swab     Status: None   Collection Time: 02/14/19  1:17 PM   Specimen: Nasopharyngeal Swab  Result Value Ref Range Status   SARS  Coronavirus 2 NEGATIVE NEGATIVE Final    Comment: (NOTE) If result is NEGATIVE SARS-CoV-2 target nucleic acids are NOT DETECTED. The SARS-CoV-2 RNA is generally detectable in upper and lower  respiratory specimens during the acute phase of infection. The lowest  concentration of SARS-CoV-2 viral copies this assay can detect is 250  copies / mL. A negative result does not preclude SARS-CoV-2 infection  and should not be used as the sole basis for treatment or other  patient management decisions.  A negative result may occur with  improper specimen collection / handling, submission of specimen other  than nasopharyngeal swab, presence of viral mutation(s) within the  areas targeted by this assay, and inadequate number of viral copies  (<250 copies / mL). A negative result must be combined with clinical  observations, patient history, and epidemiological information. If result is POSITIVE SARS-CoV-2 target nucleic acids are DETECTED. The SARS-CoV-2 RNA is generally detectable in upper and lower  respiratory specimens dur ing the acute phase of infection.  Positive  results are indicative of active infection with SARS-CoV-2.  Clinical  correlation with patient history and other diagnostic information is  necessary to determine patient infection status.  Positive results do  not rule out bacterial infection or co-infection with other viruses. If result is PRESUMPTIVE POSTIVE SARS-CoV-2 nucleic acids MAY BE PRESENT.   A presumptive positive result was obtained on the submitted specimen  and confirmed on repeat testing.  While 2019 novel coronavirus  (SARS-CoV-2) nucleic acids may be present in the submitted sample  additional confirmatory testing may be necessary for epidemiological  and / or clinical management purposes  to differentiate between  SARS-CoV-2 and other Sarbecovirus currently known to infect humans.  If clinically indicated additional testing with an alternate test   methodology [phone]) is advised. The SARS-CoV-2 RNA is generally  detectable in upper and lower respiratory sp ecimens during the acute  phase of infection. The expected result is Negative. Fact Sheet for Patients:  StrictlyIdeas.no Fact Sheet for Healthcare Providers: BankingDealers.co.za This test is not yet approved or cleared by the [place] FDA and has been authorized for detection and/or diagnosis of SARS-CoV-2 by FDA under an Emergency Use Authorization (EUA).  This EUA will remain in effect (meaning this test can be used) for the duration of the COVID-19 declaration under Section 564(b)(1) of the Act, 21 U.S.C. section 360bbb-3(b)(1), unless the authorization is terminated or revoked sooner. Performed at [facility], [address] [place], [place] [address]   Culture, blood (routine x 2)     Status: None (Preliminary result)   Collection Time: 02/18/19  9:41 AM   Specimen: BLOOD  Result Value Ref Range Status   Specimen Description BLOOD LEFT ANTECUBITAL  Final   Special Requests   Final    BOTTLES DRAWN AEROBIC AND ANAEROBIC Blood Culture results may not be optimal due to an excessive volume of blood received in culture bottles   Culture  Setup Time   Final    Organism ID to follow GRAM NEGATIVE RODS IN BOTH AEROBIC AND ANAEROBIC BOTTLES CRITICAL RESULT CALLED TO, READ BACK BY AND VERIFIED WITH: JASON ROBBINS @ 2107 ON 02/18/2019 BY CAF Performed at [facility]  Lab, [place], [place] [address]    Culture GRAM NEGATIVE RODS  Final   Report Status PENDING  Incomplete  Blood Culture ID Panel (Reflexed)     Status: Abnormal   Collection Time: 02/18/19  9:41 AM  Result Value Ref Range Status   Enterococcus species NOT DETECTED NOT DETECTED Final   Listeria monocytogenes NOT DETECTED NOT DETECTED Final   Staphylococcus species NOT DETECTED NOT DETECTED Final   Staphylococcus aureus  (BCID) NOT DETECTED NOT DETECTED Final   Streptococcus species NOT DETECTED NOT DETECTED Final   Streptococcus agalactiae NOT DETECTED NOT DETECTED Final   Streptococcus pneumoniae NOT DETECTED NOT DETECTED Final   Streptococcus pyogenes NOT DETECTED NOT DETECTED Final   Acinetobacter baumannii NOT DETECTED NOT DETECTED Final   Enterobacteriaceae species DETECTED (A) NOT DETECTED Final    Comment: Enterobacteriaceae represent a large family of gram-negative bacteria, not a single organism. CRITICAL RESULT CALLED TO, READ BACK BY AND VERIFIED WITH: JASON ROBBINS @ 2107 ON 02/18/2019 BY CAF    Enterobacter cloacae complex NOT DETECTED NOT DETECTED Final   Escherichia coli NOT DETECTED NOT DETECTED Final   Klebsiella oxytoca NOT DETECTED NOT DETECTED Final   Klebsiella pneumoniae NOT DETECTED NOT DETECTED Final   Proteus species DETECTED (A) NOT DETECTED Final    Comment: CRITICAL RESULT CALLED TO, READ BACK BY AND VERIFIED WITH: JASON ROBBINS @ 2107 ON 02/18/2019 BY CAF    Serratia marcescens NOT DETECTED NOT DETECTED Final   Carbapenem resistance NOT DETECTED NOT DETECTED Final   Haemophilus influenzae NOT DETECTED NOT DETECTED Final   Neisseria meningitidis NOT DETECTED NOT DETECTED Final   Pseudomonas aeruginosa NOT DETECTED NOT DETECTED Final   Candida albicans NOT DETECTED NOT DETECTED Final   Candida glabrata NOT DETECTED NOT DETECTED Final   Candida krusei NOT DETECTED NOT DETECTED Final   Candida parapsilosis NOT DETECTED NOT DETECTED Final   Candida tropicalis NOT DETECTED NOT DETECTED Final    Comment: Performed at [facility], [address] [place], [place] [address]  SARS Coronavirus 2 [facility] order, Performed in [facility] hospital lab) Nasopharyngeal Nasopharyngeal Swab     Status: None   Collection Time: 02/18/19 10:18 AM   Specimen: Nasopharyngeal Swab  Result Value Ref Range Status   SARS Coronavirus 2 NEGATIVE NEGATIVE Final    Comment: (NOTE) If result  is NEGATIVE SARS-CoV-2 target nucleic acids are NOT DETECTED. The SARS-CoV-2 RNA is generally detectable in upper and lower  respiratory specimens during the acute phase of infection. The lowest  concentration of SARS-CoV-2 viral copies this assay can detect is 250  copies / mL. A negative result does not preclude SARS-CoV-2 infection  and should not be used as the sole basis for treatment or other  patient management decisions.  A negative result may occur with  improper specimen collection / handling, submission of specimen other  than nasopharyngeal swab, presence of viral mutation(s) within the  areas targeted by this assay, and inadequate number of viral copies  (<250 copies / mL). A negative result must be combined with clinical  observations, patient history, and epidemiological information. If result is POSITIVE SARS-CoV-2 target nucleic acids are DETECTED. The SARS-CoV-2 RNA is generally detectable in upper and lower  respiratory specimens dur ing the acute phase of infection.  Positive  results are indicative of active infection with SARS-CoV-2.  Clinical  correlation with patient history and other diagnostic information is  necessary to determine patient infection status.  Positive  results do  not rule out bacterial infection or co-infection with other viruses. If result is PRESUMPTIVE POSTIVE SARS-CoV-2 nucleic acids MAY BE PRESENT.   A presumptive positive result was obtained on the submitted specimen  and confirmed on repeat testing.  While 2019 novel coronavirus  (SARS-CoV-2) nucleic acids may be present in the submitted sample  additional confirmatory testing may be necessary for epidemiological  and / or clinical management purposes  to differentiate between  SARS-CoV-2 and other Sarbecovirus currently known to infect humans.  If clinically indicated additional testing with an alternate test  methodology [phone]) is advised. The SARS-CoV-2 RNA is generally   detectable in upper and lower respiratory sp ecimens during the acute  phase of infection. The expected result is Negative. Fact Sheet for Patients:  StrictlyIdeas.no Fact Sheet for Healthcare Providers: BankingDealers.co.za This test is not yet approved or cleared by the [place] FDA and has been authorized for detection and/or diagnosis of SARS-CoV-2 by FDA under an Emergency Use Authorization (EUA).  This EUA will remain in effect (meaning this test can be used) for the duration of the COVID-19 declaration under Section 564(b)(1) of the Act, 21 U.S.C. section 360bbb-3(b)(1), unless the authorization is terminated or revoked sooner. Performed at [facility], [address] [place], [place] [address]   Culture, blood (routine x 2)     Status: None (Preliminary result)   Collection Time: 02/18/19 10:28 AM   Specimen: BLOOD  Result Value Ref Range Status   Specimen Description   Final    BLOOD RIGHT ANTECUBITAL Performed at [facility], [place] [address] [place], [place] [address]    Special Requests   Final    BOTTLES DRAWN AEROBIC AND ANAEROBIC Blood Culture adequate volume Performed at [facility], [address] [place], [place] [address]    Culture  Setup Time   Final    GRAM NEGATIVE RODS IN BOTH AEROBIC AND ANAEROBIC BOTTLES CRITICAL VALUE NOTED.  VALUE IS CONSISTENT WITH PREVIOUSLY REPORTED AND CALLED VALUE. Performed at [facility], [address], [place], [place] [address]    Culture   Final    CULTURE REINCUBATED FOR BETTER GROWTH Performed at [facility], [place] [address], [place], [place] [address]    Report Status PENDING  Incomplete          IMAGING    Dg Chest Port 1 View  Result Date: 02/19/2019 CLINICAL DATA:  Shortness of breath. EXAM: PORTABLE CHEST 1 VIEW COMPARISON:  02/18/2019 FINDINGS: Stable heart size and aortic tortuosity. Increased atelectasis  of the right lower lung with probable component of moderate right pleural effusion. No overt pulmonary edema. No pneumothorax. IMPRESSION: Increased atelectasis of the right lung with probable component of moderate right pleural effusion. Electronically Signed   By: Aletta Edouard M.D.   On: 02/19/2019 12:56          ASSESSMENT AND PLAN SYNOPSIS   Severe ACUTE Hypoxic and Hypercapnic Respiratory Failure from metastatic lung cancer-stage 4 Continue biPAP Plan for US Thoracentesis to assess RT pleural effusion Continue therapy ABX for pneumonia  CARDIAC ICU monitoring  ID -continue IV abx as prescibed -follow up cultures  GI GI PROPHYLAXIS as indicated  NUTRITIONAL STATUS DIET-->NPO Constipation protocol as indicated  ENDO - will use ICU hypoglycemic\Hyperglycemia protocol if indicated   ELECTROLYTES -follow labs as needed -replace as needed -pharmacy consultation and following   DVT/GI PRX ordered TRANSFUSIONS AS NEEDED MONITOR FSBS ASSESS the need for LABS  as needed   Critical Care Time devoted to patient care services described in this note is 32  minutes.   Overall, patient is critically ill, prognosis is guarded.  Patient with Multiorgan failure and at high risk for cardiac arrest and death.   Patient is DNR/DNI Dec 04, 2022 Need to consider Hospice and Comfort care measures  Marvion Bastidas Patricia Pesa, M.D.  [facility]  Medical Director [place] Director [facility] Cardio-Pulmonary Department

## 2019-02-20 NOTE — Progress Notes (Signed)
[place] [facility] [place] NAME: Cathy Buck    MR#:  [id number]  DATE OF BIRTH:  11-01-1940  SUBJECTIVE:  CHIEF COMPLAINT: Patient is more awake and alert.  off BiPAP  Daughter is at bedside, responding to daughter  REVIEW OF SYSTEMS:  Review of system unobtainable as the patient is encephalopathic  DRUG ALLERGIES:   Allergies  Allergen Reactions  . Codeine Hives  . Atorvastatin Other (See Comments)  . Citalopram Other (See Comments)    Nausea  . Keflex [Cephalexin] Diarrhea  . Tylenol [Acetaminophen] Hives    VITALS:  Blood pressure (!) 141/81, pulse (!) 109, temperature 97.9 F (36.6 C), temperature source Oral, resp. rate 17, weight 102.8 kg, SpO2 91 %.  PHYSICAL EXAMINATION:  GENERAL:  78 y.o.-year-old patient lying in the bed with no acute distress.  EYES: Pupils equal, round, reactive to light and accommodation. No scleral icterus. Extraocular muscles intact.  HEENT: Head atraumatic, normocephalic. Oropharynx and nasopharynx clear.  NECK:  Supple, no jugular venous distention. No thyroid enlargement, no tenderness.  LUNGS: Moderately diminished breath sounds bilaterally, no wheezing, rales,rhonchi or crepitation. No use of accessory muscles of respiration.  CARDIOVASCULAR: S1, S2 normal. No murmurs, rubs, or gallops.  ABDOMEN: Soft, nontender, nondistended. Bowel sounds present.  EXTREMITIES: No pedal edema, cyanosis, or clubbing.  NEUROLOGIC: Lethargic, arousable but disoriented sensation intact. Gait not checked for safety PSYCHIATRIC: The patient is disoriented and arousable SKIN: No obvious rash, lesion, or ulcer.    LABORATORY PANEL:   CBC Recent Labs  Lab 02/20/19 0422  WBC 11.2*  HGB 9.8*  HCT 32.1*  PLT 219   ------------------------------------------------------------------------------------------------------------------  Chemistries  Recent Labs  Lab 02/18/19 0941  02/20/19 0422  NA 129*   < >  139  K 5.2*   < > 4.0  CL 83*   < > 92*  CO2 34*   < > 37*  GLUCOSE 152*   < > 169*  BUN 48*   < > 36*  CREATININE 0.94   < > 0.44  CALCIUM 9.7   < > 9.2  MG  --   --  2.3  AST 37  --   --   ALT 49*  --   --   ALKPHOS 81  --   --   BILITOT 0.8  --   --    < > = values in this interval not displayed.   ------------------------------------------------------------------------------------------------------------------  Cardiac Enzymes No results for input(s): TROPONINI in the last 168 hours. ------------------------------------------------------------------------------------------------------------------  RADIOLOGY:  Mr Brain Wo Contrast  Result Date: 02/18/2019 CLINICAL DATA:  Altered level of consciousness with change in speech. History of hypertension diabetes and lung cancer. EXAM: MRI HEAD WITHOUT CONTRAST TECHNIQUE: Multiplanar, multiecho pulse sequences of the brain and surrounding structures were obtained without intravenous contrast. COMPARISON:  CT head 02/18/2019 FINDINGS: Brain: Motion degraded study. Rapid scanning utilized due to motion. Negative for acute infarct. Negative for mass or edema. Ventricle size normal. No hemorrhage or fluid collection. Scattered small white matter hyperintensities bilaterally. Vascular: Normal arterial flow voids Skull and upper cervical spine: Negative Sinuses/Orbits: Negative Other: None IMPRESSION: Motion degraded study No acute abnormality. Minimal white matter disease which appears chronic and unchanged from prior MRI Electronically Signed   By: Franchot Gallo M.D.   On: 02/18/2019 22:07   Dg Chest Port 1 View  Result Date: 02/19/2019 CLINICAL DATA:  Shortness of breath. EXAM: PORTABLE CHEST 1 VIEW COMPARISON:  02/18/2019 FINDINGS:  Stable heart size and aortic tortuosity. Increased atelectasis of the right lower lung with probable component of moderate right pleural effusion. No overt pulmonary edema. No pneumothorax. IMPRESSION: Increased  atelectasis of the right lung with probable component of moderate right pleural effusion. Electronically Signed   By: Aletta Edouard M.D.   On: 02/19/2019 12:56    EKG:   Orders placed or performed during the hospital encounter of 02/18/19  . EKG 12-Lead  . EKG 12-Lead  . EKG 12-Lead  . EKG 12-Lead    ASSESSMENT AND PLAN:   #Acute metabolic encephalopathy from acute hypoxemic and hypercapnic respiratory failure and , underlying UTI Transfer patient to stepdown unit BiPAP Blood cultures and urine cultures results are pending.  On IV Rocephin Pulmonology consult placed and discussed with Dr. Mortimer Fries CT head negative abnormalities  # New onset A. fib with RVR -initially with low BP now hemodynamically stable - EKG+Telemetry - TrendTroponins-47 - TSH, FT4 - Recent TTEcho 01/2019 with LVEF 60-65% - Diltiazem 30mg  PO qid while titrating for rate control, then transition to SA daily formulation  - Metoprolol on hold as the patient is currently on BiPAP - Cardiology consult  #. 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 Candiss Norse - Follow serial sodium [phone] -Hyponatremia resolved  # . Metastatic adenocarcinoma of the right-sided pleural effusion -Ultrasound-guided thoracentesis - Following with Dr. Tasia Catchings.  Oncology Dr. Mike Gip has seen the patient -Patient was on concurrent chemotherapy and immunotherapy prior to this admission - Plan PET scan as outpatient once stable  #Acute cystitis urine culture greater than 100,000 colonies of Klebsiella ornithinolytica, sensitivities are pending.  IV Rocephin  5. Type 2 diabetes mellitus -sliding scale  6.   Hyperkalemia Kayexalate given repeat a.m. labs with potassium 4.0 and magnesium 2.3   7. Thromboembolism Risk Management - Lovenox per pharmacy protocol pending cardiology evaluation       All the records are reviewed and case discussed  with Care Management/Social Workerr. Management plans discussed with the patient, daughter Lin Landsman healthcare power of attorney bed side  and she is  in agreement.  CODE STATUS: fc  TOTAL TIME TAKING CARE OF THIS PATIENT: 36   minutes.   POSSIBLE D/C IN 2  DAYS, DEPENDING ON CLINICAL CONDITION.  Note: This dictation was prepared with Dragon dictation along with smaller phrase technology. Any transcriptional errors that result from this process are unintentional.   Nicholes Mango M.D on 02/20/2019 at 2:48 PM  Between 7am to 6pm - Pager - [phone] After 6pm go to [email or web address] - password [email or web address] [facility]  Office  [phone]  CC: Primary care physician; Sallee Lange, NP

## 2019-02-20 NOTE — Progress Notes (Signed)
Assisted tele visit to patient with family member.  Evander Macaraeg P, RN  

## 2019-02-20 NOTE — Consult Note (Signed)
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 She also had hyponatremia which was treated. During that hospitalization there was some difficulty  with urination and post void bladder scan was being done.  Patient Vitals for the past 24 hrs:  BP Temp Temp src Pulse Resp SpO2 Weight  02/20/19 1600 (!) 144/85 98.2 F (36.8 C) Oral 95 20 93 % -  02/20/19 1500 (!) 143/77 - - 82 (!) 21 100 % -  02/20/19 1400 (!) 148/67 - - 99 (!) 30 (!) 82 % -  02/20/19 1300 (!) 141/81 - - (!) 109 17 91 % -  02/20/19 1200 (!) 152/99 97.9 F (36.6 C) Oral 81 18 94 % -  02/20/19 1100 (!) 156/108 - - 78 20 92 % -  02/20/19 1000 (!) 141/88 - - 92 20 90 % -  02/20/19 0900 (!) 132/93 - - 83 17 94 % -  02/20/19 0800 (!) 110/56 98 F (36.7 C) Axillary 83 20 93 % -  02/20/19 0600 118/61 - - 82 (!) 21 96 % 102.8 kg  02/20/19 0513 107/82 - - 86 16 94 % -  02/20/19 0500 - - - 86 16 96 % -  02/20/19 0400 (!) 142/76 - - 86 16 96 % -  02/20/19 0300 118/73 - - 84 16 96 % -  02/20/19 0200 131/62 99.2 F (37.3 C) Axillary 90 20 95 % -  02/20/19 0100 117/62 - - - 19 - -  02/20/19 0000 (!) 116/55 - - - 15 - -  02/19/19 2300 121/66 - - - (!) 23 - -  02/19/19 2200 129/64 - - 91 (!) 27 95 % -  02/19/19 2100 120/66 - - - 18 - -  02/19/19 2000 107/63 98.4 F (36.9 C) Axillary 95 16 93 % -  02/19/19 1800 102/62 - - 82 18 (!) 84 % -  02/19/19 1700 (!) 106/56 - - (!) 115 16 (!) 70 % -    CBC Latest Ref Rng & Units 02/20/2019 02/18/2019 02/11/2019  WBC 4.0 - 10.5 K/uL  11.2(H) 11.3(H) 5.8  Hemoglobin 12.0 - 15.0 g/dL 9.8(L) 11.0(L) 10.7(L)  Hematocrit 36.0 - 46.0 % 32.1(L) 35.6(L) 34.6(L)  Platelets 150 - 400 K/uL 219 253 216    CMP Latest Ref Rng & Units 02/20/2019 02/19/2019 02/19/2019  Glucose 70 - 99 mg/dL 169(H) - 114(H)  BUN 8 - 23 mg/dL 36(H) - 53(H)  Creatinine 0.44 - 1.00 mg/dL 0.44 - 0.79  Sodium 135 - 145 mmol/L 139 - 131(L)  Potassium 3.5 - 5.1 mmol/L 4.0 4.2 5.7(H)  Chloride 98 - 111 mmol/L 92(L) - 89(L)  CO2 22 - 32 mmol/L 37(H) - 29  Calcium 8.9 - 10.3 mg/dL 9.2 - 9.4  Total Protein 6.5 - 8.1 g/dL - - -  Total Bilirubin 0.3 - 1.2 mg/dL - - -  Alkaline Phos 38 - 126 U/L - - -  AST 15 - 41 U/L - - -  ALT 0 - 44 U/L - - -  Impression/recommendation Proteus bacteremia- usual source is urine but in her case urine has klebsiella, r/o empyema- awaiting thoracentesisis Will get post void bladder scan to look for residue as there was a concern about that last admission Currently on ceftriaxone and will continue the same as she is stable except for afib with no fever or leucocytosis   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

## 2019-02-21 ENCOUNTER — Inpatient Hospital Stay: Payer: Medicare Other

## 2019-02-21 DIAGNOSIS — Z885 Allergy status to narcotic agent status: Secondary | ICD-10-CM

## 2019-02-21 DIAGNOSIS — D649 Anemia, unspecified: Secondary | ICD-10-CM

## 2019-02-21 DIAGNOSIS — Z7189 Other specified counseling: Secondary | ICD-10-CM

## 2019-02-21 DIAGNOSIS — B964 Proteus (mirabilis) (morganii) as the cause of diseases classified elsewhere: Secondary | ICD-10-CM

## 2019-02-21 DIAGNOSIS — J449 Chronic obstructive pulmonary disease, unspecified: Secondary | ICD-10-CM

## 2019-02-21 DIAGNOSIS — Z881 Allergy status to other antibiotic agents status: Secondary | ICD-10-CM

## 2019-02-21 DIAGNOSIS — Z888 Allergy status to other drugs, medicaments and biological substances status: Secondary | ICD-10-CM

## 2019-02-21 DIAGNOSIS — Z66 Do not resuscitate: Secondary | ICD-10-CM

## 2019-02-21 DIAGNOSIS — G9341 Metabolic encephalopathy: Secondary | ICD-10-CM

## 2019-02-21 DIAGNOSIS — Z515 Encounter for palliative care: Secondary | ICD-10-CM

## 2019-02-21 DIAGNOSIS — R404 Transient alteration of awareness: Secondary | ICD-10-CM

## 2019-02-21 DIAGNOSIS — J90 Pleural effusion, not elsewhere classified: Secondary | ICD-10-CM

## 2019-02-21 DIAGNOSIS — D509 Iron deficiency anemia, unspecified: Secondary | ICD-10-CM

## 2019-02-21 DIAGNOSIS — R7881 Bacteremia: Secondary | ICD-10-CM

## 2019-02-21 DIAGNOSIS — R4182 Altered mental status, unspecified: Secondary | ICD-10-CM

## 2019-02-21 DIAGNOSIS — Z87891 Personal history of nicotine dependence: Secondary | ICD-10-CM

## 2019-02-21 DIAGNOSIS — I4891 Unspecified atrial fibrillation: Secondary | ICD-10-CM

## 2019-02-21 DIAGNOSIS — E222 Syndrome of inappropriate secretion of antidiuretic hormone: Secondary | ICD-10-CM

## 2019-02-21 LAB — BODY FLUID CELL COUNT WITH DIFFERENTIAL
Eos, Fluid: 0 %
Lymphs, Fluid: 30 %
Monocyte-Macrophage-Serous Fluid: 0 %
Neutrophil Count, Fluid: 70 %
Other Cells, Fluid: 0 %
Total Nucleated Cell Count, Fluid: 512 cu mm

## 2019-02-21 LAB — CULTURE, BLOOD (ROUTINE X 2): Special Requests: ADEQUATE

## 2019-02-21 LAB — BASIC METABOLIC PANEL
Anion gap: 11 (ref 5–15)
BUN: 28 mg/dL — ABNORMAL HIGH (ref 8–23)
CO2: 38 mmol/L — ABNORMAL HIGH (ref 22–32)
Calcium: 9.6 mg/dL (ref 8.9–10.3)
Chloride: 91 mmol/L — ABNORMAL LOW (ref 98–111)
Creatinine, Ser: 0.43 mg/dL — ABNORMAL LOW (ref 0.44–1.00)
GFR calc Af Amer: >60 mL/min (ref >60)
GFR calc non Af Amer: >60 mL/min (ref >60)
Glucose, Bld: 236 mg/dL — ABNORMAL HIGH (ref 70–99)
Potassium: 3.5 mmol/L (ref 3.5–5.1)
Sodium: 140 mmol/L (ref 135–145)

## 2019-02-21 LAB — PHOSPHORUS: Phosphorus: 2.4 mg/dL — ABNORMAL LOW (ref 2.5–4.6)

## 2019-02-21 LAB — URINE CULTURE: Culture: >=100000 — AB

## 2019-02-21 LAB — MAGNESIUM: Magnesium: 2.3 mg/dL (ref 1.7–2.4)

## 2019-02-21 LAB — LACTATE DEHYDROGENASE, PLEURAL OR PERITONEAL FLUID: LD, Fluid: 261 U/L — ABNORMAL HIGH (ref 3–23)

## 2019-02-21 LAB — GLUCOSE, PLEURAL OR PERITONEAL FLUID: Glucose, Fluid: 267 mg/dL

## 2019-02-21 MED ORDER — AMIODARONE HCL IN DEXTROSE 360-4.14 MG/200ML-% IV SOLN: 30.0000 mg/h | INTRAVENOUS | Status: DC

## 2019-02-21 MED ORDER — AMIODARONE HCL 200 MG PO TABS
200.0000 mg | ORAL_TABLET | Freq: Two times a day (BID) | ORAL | Status: DC
  Administered 2019-02-21 – 2019-03-06 (×26): 200 mg via ORAL
  Filled 2019-02-21 (×26): qty 1

## 2019-02-21 MED ORDER — POTASSIUM PHOSPHATES 15 MMOLE/5ML IV SOLN
10.0000 mmol | Freq: Once | INTRAVENOUS | Status: AC
  Administered 2019-02-21: 11:00:00 10 mmol via INTRAVENOUS
  Filled 2019-02-21: qty 3.33

## 2019-02-21 MED ORDER — TRAZODONE HCL 50 MG PO TABS
50.0000 mg | ORAL_TABLET | Freq: Every evening | ORAL | Status: DC | PRN
  Administered 2019-02-22 (×2): 50 mg via ORAL
  Filled 2019-02-21 (×2): qty 1

## 2019-02-21 NOTE — Consult Note (Signed)
NAME: Cathy Buck  DOB: 18-Sep-1940  MRN: [id number]  Date/Time: 02/21/2019 9:09 AM  REQUESTING PROVIDER: Dr. Mortimer Fries Subjective:  RE patient is ASON FOR CONSULT: Proteus bacteremia ?Patient is confused and no history available .  Chart reviewed Cathy Buck is a 78 y.o. with a history of newly diagnosed adenocarcinoma of the lung in July 2020 sent from cancer center for confusion and low BP on 02/18/19.  In the ED she had a temperature of 95.7,Blood pressure of 87/55 heart rate of 106, and respiratory rate of 24, pulse ox of 95%.  Labs revealed a sodium of 129, creatinine of 0.94, potassium of 5.2, ALT 49 and AST of 37.  Lactate was 1.6.WBC was 11.3, hemoglobin was 11 and platelet was 253.  Urine analysis showed WBC of more than 50.  Blood culture and urine culture were sent.  .  Chest x-ray showed progressive right base atelectasis/infiltrate and right-sided pleural effusion.  She was started on IV ceftriaxone.  She also was found to be in atrial fibrillation and started on IV amiodarone. Blood culture is positive for Proteus and I am seeing the patient for the same. She is also on methylprednisone.  Blood gas done on 02/19/2019 showed PCO2 of 78, PO2 of 81 and HCO3 of 45.1.  Recent hospitalization between 7/23 to 8/3 for sob and found to have rt pleural effusion and had thoracentesis and diagnosed with adenocarcinoma consistent with lung origin. She also had hyponatremia which was treated.  The discharge summary from that hospitalization notes  difficulty  with urination and  recommended post void bladder scan frequently.  Past Medical History:  Diagnosis Date  . Diabetes mellitus without complication ([place])   . Hypertension     Past Surgical History:  Procedure Laterality Date  . ABDOMINAL HYSTERECTOMY    . CHOLECYSTECTOMY      Social History   Socioeconomic History  . Marital status: Married    Spouse name: Not on file  . Number of children: Not on file  . Years of education: Not on file   . Highest education level: Not on file  Occupational History  . Not on file  Social Needs  . Financial resource strain: Not hard at all  . Food insecurity    Worry: Never true    Inability: Never true  . Transportation needs    Medical: No    Non-medical: No  Tobacco Use  . Smoking status: Former Research scientist (life sciences)  . Smokeless tobacco: Never Used  Substance and Sexual Activity  . Alcohol use: Not Currently  . Drug use: Not Currently  . Sexual activity: Not Currently  Lifestyle  . Physical activity    Days per week: 0 days    Minutes per session: 0 min  . Stress: Not at all  Relationships  . Social Herbalist on phone: Once a week    Gets together: Once a week    Attends religious service: 1 to 4 times per year    Active member of club or organization: No    Attends meetings of clubs or organizations: Never    Relationship status: Married  . Intimate partner violence    Fear of current or ex partner: No    Emotionally abused: No    Physically abused: No    Forced sexual activity: No  Other Topics Concern  . Not on file  Social History Narrative   Lives at home with husband.  Uses cane, walker and rollator a  times    No family history on file. Allergies  Allergen Reactions  . Codeine Hives  . Atorvastatin Other (See Comments)  . Citalopram Other (See Comments)    Nausea  . Keflex [Cephalexin] Diarrhea  . Tylenol [Acetaminophen] Hives   ? Current Facility-Administered Medications  Medication Dose Route Frequency Provider Last Rate Last Dose  . 0.9 %  sodium chloride infusion   Intravenous PRN Lang Snow, NP 10 mL/hr at 02/18/19 2259 50 mL at 02/18/19 2259  . amiodarone (NEXTERONE PREMIX) 360-4.14 MG/200ML-% (1.8 mg/mL) IV infusion  30 mg/hr Intravenous Continuous Tyler Pita, MD      . budesonide (PULMICORT) nebulizer solution 0.5 mg  0.5 mg Nebulization BID Flora Lipps, MD   0.5 mg at 02/21/19 0749  . calcium-vitamin D (OSCAL WITH D) 500-200  MG-UNIT per tablet 1 tablet  1 tablet Oral Q breakfast Lang Snow, NP   1 tablet at 02/21/19 0759  . cefTRIAXone (ROCEPHIN) 2 g in sodium chloride 0.9 % 100 mL IVPB  2 g Intravenous Q24H Lang Snow, NP   Stopped at 02/20/19 2141  . enoxaparin (LOVENOX) injection 100 mg  100 mg Subcutaneous BID Lang Snow, NP   100 mg at 02/20/19 2325  . feeding supplement (PRO-STAT SUGAR FREE 64) liquid 30 mL  30 mL Oral Daily Lang Snow, NP   30 mL at 02/20/19 1007  . ipratropium-albuterol (DUONEB) 0.5-2.5 (3) MG/3ML nebulizer solution 3 mL  3 mL Nebulization Q4H Flora Lipps, MD   3 mL at 02/21/19 0749  . methylPREDNISolone sodium succinate (SOLU-MEDROL) 40 mg/mL injection 40 mg  40 mg Intravenous Q12H Flora Lipps, MD   40 mg at 02/21/19 0212  . morphine 2 MG/ML injection 2 mg  2 mg Intravenous Q1H PRN Flora Lipps, MD   2 mg at 02/19/19 1623  . multivitamin with minerals tablet 1 tablet  1 tablet Oral Daily Lang Snow, NP   1 tablet at 02/20/19 1007  . pantoprazole (PROTONIX) EC tablet 40 mg  40 mg Oral Daily Lang Snow, NP   40 mg at 02/20/19 1007  . polyethylene glycol (MIRALAX / GLYCOLAX) packet 17 g  17 g Oral Daily Lang Snow, NP   17 g at 02/20/19 1007  . vitamin C (ASCORBIC ACID) tablet 250 mg  250 mg Oral BID Lang Snow, NP   250 mg at 02/20/19 2116     Abtx:  Anti-infectives (From admission, onward)   Start     Dose/Rate Route Frequency Ordered Stop   02/18/19 2130  cefTRIAXone (ROCEPHIN) 2 g in sodium chloride 0.9 % 100 mL IVPB     2 g 200 mL/hr over 30 Minutes Intravenous Every 24 hours 02/18/19 2117        REVIEW OF SYSTEMS:  NA  Objective:  VITALS:  BP (!) 149/125   Pulse 91   Temp 98.4 F (36.9 C) (Oral)   Resp 16   Wt 102.8 kg   SpO2 95%   BMI 38.90 kg/m  PHYSICAL EXAM:  General: Awake cooperative,, but confused.  Not oriented in time or place.  Responds to simple commands   no distress, obese Head: Normocephalic, without obvious abnormality, atraumatic. Eyes: Conjunctivae clear, anicteric sclerae. Pupils are equal ENT Nares normal. No drainage or sinus tenderness. Oral cavity 3 teeth remaining on the upper Neck: Supple, symmetrical, no adenopathy, thyroid: non tender no carotid bruit and no JVD.  Lungs: Bilateral air entry decreased right  base. Heart: Irregular  abdomen: Soft, non-tender,not distended. Bowel sounds normal. No masses Extremities: Edema legs Skin: No rashes or lesions. Or bruising Lymph: Cervical, supraclavicular normal. Neurologic: Grossly non-focal Pertinent Labs Lab Results CBC    Component Value Date/Time   WBC 11.2 (H) 02/20/2019 0422   RBC 4.13 02/20/2019 0422   HGB 9.8 (L) 02/20/2019 0422   HCT 32.1 (L) 02/20/2019 0422   PLT 219 02/20/2019 0422   MCV 77.7 (L) 02/20/2019 0422   MCH 23.7 (L) 02/20/2019 0422   MCHC 30.5 02/20/2019 0422   RDW 15.1 02/20/2019 0422   LYMPHSABS 0.6 (L) 02/18/2019 0941   MONOABS 0.3 02/18/2019 0941   EOSABS 0.0 02/18/2019 0941   BASOSABS 0.1 02/18/2019 0941    CMP Latest Ref Rng & Units 02/21/2019 02/20/2019 02/19/2019  Glucose 70 - 99 mg/dL 236(H) 169(H) -  BUN 8 - 23 mg/dL 28(H) 36(H) -  Creatinine 0.44 - 1.00 mg/dL 0.43(L) 0.44 -  Sodium 135 - 145 mmol/L 140 139 -  Potassium 3.5 - 5.1 mmol/L 3.5 4.0 4.2  Chloride 98 - 111 mmol/L 91(L) 92(L) -  CO2 22 - 32 mmol/L 38(H) 37(H) -  Calcium 8.9 - 10.3 mg/dL 9.6 9.2 -  Total Protein 6.5 - 8.1 g/dL - - -  Total Bilirubin 0.3 - 1.2 mg/dL - - -  Alkaline Phos 38 - 126 U/L - - -  AST 15 - 41 U/L - - -  ALT 0 - 44 U/L - - -     Microbiology: 02/18/2019 blood culture Proteus Urine culture Klebsiella Both sensitive to ceftriaxone  IMAGING RESULTS:  I have personally reviewed the films ? Impression/Recommendation ? ? ?Proteus bacteremia, usually an organism causing UTI.  But the urine culture shows Klebsiella. We need to rule out empyema of  the right lung. She had thoracentesis today.  A. fib on IV amiodarone.  UTI check for bladder residual.  Ordered post void bladder scan and talked to her nurse  Newly diagnosed adenocarcinoma of right lung with pleural effusion   Metabolic encephalopathy secondary to hyponatremia as well as CO2 narcosis.  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  Normal serum cortisol   COPD  ___________________________________________________ Discussed with her nurse Note:  This document was prepared using Dragon voice recognition software and may include unintentional dictation errors.

## 2019-02-21 NOTE — Care Plan (Signed)
Patient is stable for transfer to telemetry unit.  She is DNR/DNI.  She has adenocarcinoma of the lung stage IV.  Currently atrial fibrillation with controlled ventricular response.  On amiodarone.  Will transition amiodarone to p.o.  Recommend palliative care evaluation for establishing goals of care.  She is to have repeat thoracentesis though I doubt that she has empyema.  She has already been shown to have adenocarcinoma in the pleural effusion on 27 July.  PCCM will sign off.  Please reconsult as needed.

## 2019-02-21 NOTE — Progress Notes (Signed)
Patient was refusing to keep tele box on, informed physician, will d/c order. Wenda Low [facility]

## 2019-02-21 NOTE — Progress Notes (Signed)
Hematology/Oncology Progress Note [facility] Telephone:([phone] [phone]  Patient Care Team: Sallee Lange, NP as PCP - General (Internal Medicine) Telford Nab, RN as Registered Nurse   Name of the patient: Cathy Buck  [id number]  21-Jun-1941  Date of visit: 02/21/19   INTERVAL HISTORY-  No acute overnight events.  Afebrile.  Daughter at the bedside. Patient reports no new complaints.  Breathing via nasal cannula oxygen    Review of systems- Review of Systems  Unable to perform ROS: Mental status change    Allergies  Allergen Reactions   Codeine Hives   Atorvastatin Other (See Comments)   Citalopram Other (See Comments)    Nausea   Keflex [Cephalexin] Diarrhea   Tylenol [Acetaminophen] Hives    Patient Active Problem List   Diagnosis Date Noted   Palliative care by specialist    DNR (do not resuscitate)    Altered mental status 02/18/2019   Primary malignant neoplasm of lung metastatic to other site [facility])    Pleural effusion    Weakness    Hyponatremia 02/03/2019   HTN (hypertension) 02/03/2019   Diabetes ([place]) 02/03/2019     Past Medical History:  Diagnosis Date   Diabetes mellitus without complication ([place])    Hypertension      Past Surgical History:  Procedure Laterality Date   ABDOMINAL HYSTERECTOMY     CHOLECYSTECTOMY      Social History   Socioeconomic History   Marital status: Married    Spouse name: Not on file   Number of children: Not on file   Years of education: Not on file   Highest education level: Not on file  Occupational History   Not on file  Social Needs   Financial resource strain: Not hard at all   Food insecurity    Worry: Never true    Inability: Never true   Transportation needs    Medical: No    Non-medical: No  Tobacco Use   Smoking status: Former Smoker   Smokeless tobacco: Never Used  Substance and Sexual Activity   Alcohol use:  Not Currently   Drug use: Not Currently   Sexual activity: Not Currently  Lifestyle   Physical activity    Days per week: 0 days    Minutes per session: 0 min   Stress: Not at all  Relationships   Social connections    Talks on phone: Once a week    Gets together: Once a week    Attends religious service: 1 to 4 times per year    Active member of club or organization: No    Attends meetings of clubs or organizations: Never    Relationship status: Married   Intimate partner violence    Fear of current or ex partner: No    Emotionally abused: No    Physically abused: No    Forced sexual activity: No  Other Topics Concern   Not on file  Social History Narrative   Lives at home with husband.  Uses cane, walker and rollator a times     No family history on file.   Current Facility-Administered Medications:    0.9 %  sodium chloride infusion, , Intravenous, PRN, Lang Snow, NP, Last Rate: 10 mL/hr at 02/18/19 2259, 50 mL at 02/18/19 2259   amiodarone (PACERONE) tablet 200 mg, 200 mg, Oral, BID, Tyler Pita, MD, 200 mg at 02/21/19 1022   budesonide (PULMICORT) nebulizer solution 0.5 mg, 0.5 mg,  Nebulization, BID, Flora Lipps, MD, 0.5 mg at 02/21/19 0749   calcium-vitamin D (OSCAL WITH D) 500-200 MG-UNIT per tablet 1 tablet, 1 tablet, Oral, Q breakfast, Ouma, Bing Neighbors, NP, 1 tablet at 02/21/19 0759   cefTRIAXone (ROCEPHIN) 2 g in sodium chloride 0.9 % 100 mL IVPB, 2 g, Intravenous, Q24H, Ouma, Bing Neighbors, NP, Stopped at 02/20/19 2141   enoxaparin (LOVENOX) injection 100 mg, 100 mg, Subcutaneous, BID, Ouma, Bing Neighbors, NP, 100 mg at 02/21/19 1405   feeding supplement (PRO-STAT SUGAR FREE 64) liquid 30 mL, 30 mL, Oral, Daily, Ouma, Bing Neighbors, NP, 30 mL at 02/20/19 1007   ipratropium-albuterol (DUONEB) 0.5-2.5 (3) MG/3ML nebulizer solution 3 mL, 3 mL, Nebulization, Q4H, Kasa, Kurian, MD, 3 mL at 02/21/19 0749    methylPREDNISolone sodium succinate (SOLU-MEDROL) 40 mg/mL injection 40 mg, 40 mg, Intravenous, Q12H, Kasa, Kurian, MD, 40 mg at 02/21/19 1405   morphine 2 MG/ML injection 2 mg, 2 mg, Intravenous, Q1H PRN, Flora Lipps, MD, 2 mg at 02/19/19 1623   multivitamin with minerals tablet 1 tablet, 1 tablet, Oral, Daily, Ouma, Bing Neighbors, NP, 1 tablet at 02/21/19 0931   pantoprazole (PROTONIX) EC tablet 40 mg, 40 mg, Oral, Daily, Ouma, Bing Neighbors, NP, 40 mg at 02/21/19 0931   polyethylene glycol (MIRALAX / GLYCOLAX) packet 17 g, 17 g, Oral, Daily, Ouma, Bing Neighbors, NP, 17 g at 02/21/19 7124   vitamin C (ASCORBIC ACID) tablet 250 mg, 250 mg, Oral, BID, Lang Snow, NP, 250 mg at 02/21/19 0931   Physical exam:  Vitals:   02/21/19 1100 02/21/19 1136 02/21/19 1159 02/21/19 1234  BP: (!) 145/77 (!) 145/77 138/64 (!) 148/105  Pulse:    81  Resp: (!) 28   19  Temp:    98.3 F (36.8 C)  TempSrc:      SpO2:      Weight:       Physical Exam  Constitutional: She appears distressed.  HENT:  Head: Normocephalic.  Eyes: Pupils are equal, round, and reactive to light. No scleral icterus.  Neck: Normal range of motion. Neck supple.  Pulmonary/Chest: Effort normal.  Decreased breath sound bilaterally.  Abdominal: Soft. Bowel sounds are normal.  Musculoskeletal: Normal range of motion.  Neurological: She is alert.  Orientated to herself and place.  Skin: Skin is warm.  Psychiatric: Affect normal.       CMP Latest Ref Rng & Units 02/21/2019  Glucose 70 - 99 mg/dL 236(H)  BUN 8 - 23 mg/dL 28(H)  Creatinine 0.44 - 1.00 mg/dL 0.43(L)  Sodium 135 - 145 mmol/L 140  Potassium 3.5 - 5.1 mmol/L 3.5  Chloride 98 - 111 mmol/L 91(L)  CO2 22 - 32 mmol/L 38(H)  Calcium 8.9 - 10.3 mg/dL 9.6  Total Protein 6.5 - 8.1 g/dL -  Total Bilirubin 0.3 - 1.2 mg/dL -  Alkaline Phos 38 - 126 U/L -  AST 15 - 41 U/L -  ALT 0 - 44 U/L -   CBC Latest Ref Rng & Units 02/20/2019  WBC  4.0 - 10.5 K/uL 11.2(H)  Hemoglobin 12.0 - 15.0 g/dL 9.8(L)  Hematocrit 36.0 - 46.0 % 32.1(L)  Platelets 150 - 400 K/uL 219   RADIOGRAPHIC STUDIES: I have personally reviewed the radiological images as listed and agreed with the findings in the report.  Dg Chest 2 View  Result Date: 02/10/2019 CLINICAL DATA:  Pleural effusion EXAM: CHEST - 2 VIEW COMPARISON:  Two days ago FINDINGS: Right PICC with tip at  the right atrium. Haziness in the lower right chest. Hyperinflation with diaphragm flattening. Thickened right hilum. There was adenopathy on preceding chest CT. Normal heart size. IMPRESSION: Unchanged right pleural effusion and lower lobe opacification. Electronically Signed   By: Monte Fantasia M.D.   On: 02/10/2019 10:39   Ct Head Wo Contrast  Result Date: 02/18/2019 CLINICAL DATA:  Altered level of consciousness, unexplained, lethargy, hypotension, diabetes mellitus, hypertension, lung cancer EXAM: CT HEAD WITHOUT CONTRAST TECHNIQUE: Contiguous axial images were obtained from the base of the skull through the vertex without intravenous contrast. Sagittal and coronal MPR images reconstructed from axial data set. COMPARISON:  MR brain 02/07/2019 FINDINGS: Brain: Mild generalized atrophy. Normal ventricular morphology. No midline shift or mass effect.2 dense calcification of falx. No intracranial hemorrhage, mass lesion or evidence of acute infarction. No extra-axial fluid collections. Vascular: No hyperdense vessels Skull: Intact Sinuses/Orbits: Clear Other: N/A IMPRESSION: No acute intracranial abnormalities. Electronically Signed   By: Lavonia Dana M.D.   On: 02/18/2019 11:14   Ct Chest Wo Contrast  Result Date: 02/04/2019 CLINICAL DATA:  Shortness of breath EXAM: CT CHEST WITHOUT CONTRAST TECHNIQUE: Multidetector CT imaging of the chest was performed following the standard protocol without IV contrast. COMPARISON:  Plain film from previous day FINDINGS: Cardiovascular: Somewhat limited due to  lack of IV contrast. Aortic calcifications are noted without significant aneurysmal dilatation. No cardiac enlargement is seen. No pericardial effusion is noted. Mediastinum/Nodes: The esophagus is within normal limits. No definitive hilar adenopathy is noted. There is a right paratracheal node which measures approximately 16 mm in short axis as well as a subcarinal node which measures 17 mm in short axis. These are likely reactive in nature. Thoracic inlet is within normal limits. Lungs/Pleura: The left lung is well aerated with minimal atelectatic changes in the lingula along the cardiac border. No sizable effusion is noted. The right hemithorax demonstrates a moderate to large pleural effusion increased from the prior exam. Underlying patchy infiltrate is noted throughout the right lung with more marked consolidation in the right lung base. No definitive nodule is noted. Upper Abdomen: Somewhat limited due to lack of IV contrast. No definitive abnormality is noted. Musculoskeletal: Mild degenerative change of the thoracic spine is noted. No acute bony abnormality is seen. IMPRESSION: Moderate to large right-sided pleural effusion with associated consolidation most marked in the right lower lobe. Mild left basilar atelectasis. Prominent mediastinal lymph nodes likely reactive in nature. Aortic Atherosclerosis (ICD10-I70.0). Electronically Signed   By: Inez Catalina M.D.   On: 02/04/2019 15:36   Mr Brain Wo Contrast  Result Date: 02/18/2019 CLINICAL DATA:  Altered level of consciousness with change in speech. History of hypertension diabetes and lung cancer. EXAM: MRI HEAD WITHOUT CONTRAST TECHNIQUE: Multiplanar, multiecho pulse sequences of the brain and surrounding structures were obtained without intravenous contrast. COMPARISON:  CT head 02/18/2019 FINDINGS: Brain: Motion degraded study. Rapid scanning utilized due to motion. Negative for acute infarct. Negative for mass or edema. Ventricle size normal. No  hemorrhage or fluid collection. Scattered small white matter hyperintensities bilaterally. Vascular: Normal arterial flow voids Skull and upper cervical spine: Negative Sinuses/Orbits: Negative Other: None IMPRESSION: Motion degraded study No acute abnormality. Minimal white matter disease which appears chronic and unchanged from prior MRI Electronically Signed   By: Franchot Gallo M.D.   On: 02/18/2019 22:07   Mr Brain Wo Contrast  Result Date: 02/07/2019 CLINICAL DATA:  Initial evaluation for acute confusion, encephalopathy. EXAM: MRI HEAD WITHOUT CONTRAST TECHNIQUE: Multiplanar, multiecho  pulse sequences of the brain and surrounding structures were obtained without intravenous contrast. COMPARISON:  None available. FINDINGS: Brain: Examination mildly degraded by motion artifact. Diffuse prominence of the CSF containing spaces compatible with generalized age-related cerebral atrophy. Mild scattered patchy T2/FLAIR hyperintensity within the periventricular deep white matter both cerebral hemispheres, most consistent with chronic microvascular ischemic disease, mild for age. No abnormal foci of restricted diffusion to suggest acute or subacute ischemia. Gray-white matter differentiation maintained. No encephalomalacia to suggest chronic cortical infarction. No foci of susceptibility artifact to suggest acute or chronic intracranial hemorrhage. No mass lesion, midline shift or mass effect. No hydrocephalus. The extra-axial fluid collection. Pituitary gland within normal limits. Midline structures intact. Vascular: Major intracranial vascular flow voids are maintained Skull and upper cervical spine: Craniocervical junction within normal limits. Multilevel degenerative spondylolysis noted within the visualized upper cervical spine without significant stenosis. Bone marrow signal intensity normal. No scalp soft tissue abnormality. Sinuses/Orbits: Globes and orbital soft tissues within normal limits. Paranasal  sinuses are clear. Left mastoid effusion noted. Inner ear structures grossly normal. Other: None. IMPRESSION: 1. No acute intracranial abnormality. 2. Mild age-related cerebral atrophy with chronic microvascular ischemic disease. 3. Left mastoid effusion, of uncertain significance. Correlation with physical exam and symptomatology for possible otomastoiditis recommended. Electronically Signed   By: Jeannine Boga M.D.   On: 02/07/2019 20:33   Dg Chest Right Decubitus  Result Date: 02/14/2019 CLINICAL DATA:  Pleural effusion. EXAM: CHEST - RIGHT DECUBITUS COMPARISON:  02/10/2019. FINDINGS: Right central line in stable position. Layering moderate sized right pleural effusion is noted. Underlying atelectatic changes are present. No pneumothorax noted. IMPRESSION: Layering moderate sized right pleural effusion. Electronically Signed   By: Marcello Moores  Register   On: 02/14/2019 06:32   Dg Chest Port 1 View  Result Date: 02/21/2019 CLINICAL DATA:  Status post right thoracentesis. EXAM: PORTABLE CHEST 1 VIEW COMPARISON:  Radiographs of February 19, 2019. FINDINGS: Stable cardiomediastinal silhouette. Atherosclerosis of thoracic aorta is noted. No pneumothorax or significant pleural effusion is noted. Left lung is clear. Improved right basilar opacity is noted, with residual atelectasis or infiltrate. Bony thorax is unremarkable. IMPRESSION: No pneumothorax is noted. No significant pleural effusion is noted. Mild right basilar opacity is noted as described above. Electronically Signed   By: Marijo Conception M.D.   On: 02/21/2019 12:57   Dg Chest Port 1 View  Result Date: 02/19/2019 CLINICAL DATA:  Shortness of breath. EXAM: PORTABLE CHEST 1 VIEW COMPARISON:  02/18/2019 FINDINGS: Stable heart size and aortic tortuosity. Increased atelectasis of the right lower lung with probable component of moderate right pleural effusion. No overt pulmonary edema. No pneumothorax. IMPRESSION: Increased atelectasis of the right lung  with probable component of moderate right pleural effusion. Electronically Signed   By: Aletta Edouard M.D.   On: 02/19/2019 12:56   Dg Chest Portable 1 View  Result Date: 02/18/2019 CLINICAL DATA:  Weakness.  Confusion. EXAM: PORTABLE CHEST 1 VIEW COMPARISON:  Weakness and confusion. FINDINGS: Interval removal of right PICC line. Heart size normal. Pulmonary venous congestion. Progressive right base atelectasis/infiltrate and right-sided pleural effusion. Left base subsegmental atelectasis again noted. No pneumothorax. IMPRESSION: 1.  Interim removal of right PICC line. 2. Progressive right base atelectasis/infiltrate and right-sided pleural effusion. Mild left base subsegmental atelectasis again noted. Electronically Signed   By: Marcello Moores  Register   On: 02/18/2019 09:54   Dg Chest Port 1 View  Result Date: 02/14/2019 CLINICAL DATA:  History of lung cancer, post right-sided thoracentesis. EXAM: PORTABLE  CHEST 1 VIEW COMPARISON:  Ultrasound-guided thoracentesis-02/14/2019; chest radiograph-earlier same day; 01/11/2019; chest CT-02/04/2019 FINDINGS: Grossly unchanged enlarged cardiac silhouette and mediastinal contours with atherosclerotic plaque within thoracic aorta. Stable position of support apparatus. Interval reduction/resolution of right-sided pleural effusion post thoracentesis. No pneumothorax. Improved aeration of the right lung base with persistent right basilar heterogeneous opacities, likely atelectasis. No new focal airspace opacities. The left hemithorax remains well aerated. No evidence of edema. No acute osseous abnormalities. IMPRESSION: Interval reduction/resolution of right-sided pleural effusion post thoracentesis. No pneumothorax. Electronically Signed   By: Sandi Mariscal M.D.   On: 02/14/2019 10:58   Dg Chest Port 1 View  Result Date: 02/08/2019 CLINICAL DATA:  Followup pleural effusion. EXAM: PORTABLE CHEST 1 VIEW COMPARISON:  02/07/2019 and earlier exams. FINDINGS: Opacity at the  right lung base mostly obscures hemidiaphragm consistent with a small effusion and atelectasis. Opacity at the left lung base is less prominent than on the previous day's study, the difference likely due to differences in patient positioning only. There is hazy opacity consistent with a smaller left pleural effusion. No new lung abnormalities. No evidence of pulmonary edema. No pneumothorax. Right-sided PICC is stable. IMPRESSION: 1. No significant change from the most recent prior study allowing for differences in patient positioning. 2. Small residual right pleural effusion with associated atelectasis. Smaller left pleural effusion with atelectasis. No convincing pulmonary edema. No pneumothorax. Electronically Signed   By: Lajean Manes M.D.   On: 02/08/2019 13:25   Dg Chest Port 1 View  Result Date: 02/07/2019 CLINICAL DATA:  Status post right-sided thoracentesis. EXAM: PORTABLE CHEST 1 VIEW COMPARISON:  02/05/2019 FINDINGS: The heart is enlarged but stable. Stable tortuosity and calcification of the thoracic aorta. The right PICC line is stable. Status post right-sided thoracentesis with interval decrease in size of the right pleural effusion. No postprocedural pneumothorax is identified. Small left pleural effusion and bibasilar atelectasis. IMPRESSION: Status post right-sided thoracentesis with near complete evacuation of the right-sided pleural effusion. No postprocedural pneumothorax. Persistent small left effusion and moderate bibasilar atelectasis. Electronically Signed   By: Marijo Sanes M.D.   On: 02/07/2019 10:33   Dg Chest Port 1 View  Result Date: 02/07/2019 CLINICAL DATA:  Shortness of breath. Patient experiencing increased shortness of breath. EXAM: PORTABLE CHEST 1 VIEW COMPARISON:  Portable chest radiograph 02/05/2019 FINDINGS: Unchanged position of a right-sided PICC with tip projecting over the upper right atrium. May consider withdrawing catheter 2.5-3 cm to place tip cavoatrial  junction. The cardiomediastinal silhouette is unchanged. Interval increase in size of a right pleural effusion with increasing right basilar atelectasis. Underlying right basilar pneumonia cannot be excluded. Ill-defined opacity throughout the mid to lower right lung has also increased and may reflect incomplete atelectasis and/or developing pneumonia. Mild left basilar atelectasis is unchanged. No evidence of pneumothorax. Degenerative changes of the spine. IMPRESSION: Unchanged position of a right PICC with tip projecting over the right atrium. Consider withdrawing catheter 2.5-3 cm to place tip at cavoatrial junction. Interval increase in size of a right pleural effusion with increasing right basilar atelectasis. Underlying right basilar pneumonia cannot be excluded. Interval increase in ill-defined opacity throughout the mid to lower right lung may reflect incomplete atelectasis and/or developing pneumonia. Electronically Signed   By: Kellie Simmering   On: 02/07/2019 08:14   Dg Chest Port 1 View  Result Date: 02/05/2019 CLINICAL DATA:  PICC line placement EXAM: PORTABLE CHEST 1 VIEW COMPARISON:  Portable exam 2225 hours compared to 1631 hours FINDINGS: RIGHT arm PICC  line with tip projecting over superior RIGHT atrium. May consider withdrawing catheter 2.5-3.0 cm to place tip at cavoatrial junction. Stable heart size and mediastinal contours. RIGHT pleural effusion and basilar atelectasis with questionable RIGHT perihilar infiltrate. Minimal LEFT base atelectasis. IMPRESSION: Tip of RIGHT arm PICC line projects over RIGHT atrium; recommend withdrawal 2.5 x 3.0 cm to place tip at approximately the cavoatrial junction. Otherwise no interval change. Electronically Signed   By: Lavonia Dana M.D.   On: 02/05/2019 22:34   Dg Chest Port 1 View  Result Date: 02/05/2019 CLINICAL DATA:  PICC line placement EXAM: PORTABLE CHEST 1 VIEW COMPARISON:  Portable exam 1613 hours compared to 02/03/2019 FINDINGS: RIGHT arm PICC  line tip deflects into the azygos arch. Upper normal heart size. Mediastinal contours and pulmonary vascularity normal. Atherosclerotic calcification aorta. RIGHT lung infiltrate with small RIGHT pleural effusion and basilar atelectasis. LEFT lung clear. No pneumothorax or acute osseous findings. IMPRESSION: Tip of RIGHT arm PICC line deflects into the azygos arch, recommend repositioning into SVC. Increased RIGHT lung infiltrates, RIGHT pleural effusion and basilar atelectasis. Findings called to [facility] in ICU on 02/05/2019 at 1839 hrs. Electronically Signed   By: Lavonia Dana M.D.   On: 02/05/2019 18:40   Dg Chest Portable 1 View  Result Date: 02/03/2019 CLINICAL DATA:  Shortness of breath. EXAM: PORTABLE CHEST 1 VIEW COMPARISON:  None. FINDINGS: The heart size and pulmonary vascularity are normal. Aortic atherosclerosis. Small right pleural effusion. Focal linear atelectasis in the right midzone. No acute bone abnormality. IMPRESSION: 1. Small right pleural effusion. Slight atelectasis in the right midzone. 2. Aortic atherosclerosis. Electronically Signed   By: Lorriane Shire M.D.   On: 02/03/2019 18:36   [place] Ekg Site Rite  Result Date: 02/05/2019 If Site Rite image not attached, placement could not be confirmed due to current cardiac rhythm.  US Thoracentesis Asp Pleural Space W/img Guide  Result Date: 02/21/2019 INDICATION: Pleural effusion. EXAM: ULTRASOUND GUIDED RIGHT THORACENTESIS MEDICATIONS: None. COMPLICATIONS: None immediate. PROCEDURE: An ultrasound guided thoracentesis was thoroughly discussed with the patient and questions answered. The benefits, risks, alternatives and complications were also discussed. The patient understands and wishes to proceed with the procedure. Written consent was obtained. Ultrasound was performed to localize and mark an adequate pocket of fluid in the right chest. The area was then prepped and draped in the normal sterile fashion. 1% Lidocaine was used for  local anesthesia. Under ultrasound guidance a 6 French catheter was introduced. Thoracentesis was performed. The catheter was removed and a dressing applied. FINDINGS: A total of approximately 800 cc of bloody fluid was removed. Samples were sent to the laboratory as requested by the clinical team. IMPRESSION: Successful ultrasound guided right thoracentesis yielding 800 cc of bloody pleural fluid. 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 Please from ultrasound-guided thoracentesis for diagnostic and therapeutic purposes. EXAM: US THORACENTESIS ASP PLEURAL SPACE W/IMG GUIDE COMPARISON:  Ultrasound-guided thoracentesis-02/07/2019 yielding 1.2 L fluid. Chest radiograph-earlier same day; 02/10/2019; chest CT-02/04/2019 MEDICATIONS: None. COMPLICATIONS: None immediate. TECHNIQUE: Informed written consent was obtained from the the patient's daughter after a discussion of the risks, benefits and alternatives to treatment. A timeout was performed prior to the initiation of the procedure. Patient was positioned left lateral decubitus in her hospital bed with initial ultrasound scanning demonstrating a recurrent moderate to  large sized anechoic right-sided pleural effusion. The inferolateral aspect of the right lower chest was prepped and draped in the usual sterile fashion. 1% lidocaine was used for local anesthesia. An ultrasound image was saved for documentation purposes. An 8 Fr Safe-T-Centesis catheter was introduced. The thoracentesis was performed. The catheter was removed and a dressing was applied. The patient tolerated the procedure well without immediate post procedural complication. The patient was escorted to have an upright chest radiograph. FINDINGS: A total of approximately 1.4 liters of serous fluid was removed.  Requested samples were sent to the laboratory. IMPRESSION: Successful ultrasound-guided right sided thoracentesis yielding 1.4 liters of pleural fluid. Electronically Signed   By: Sandi Mariscal M.D.   On: 02/14/2019 11:00   US Thoracentesis Asp Pleural Space W/img Guide  Result Date: 02/07/2019 INDICATION: Right pleural effusion, hypertension, diabetes EXAM: ULTRASOUND GUIDED RIGHT THORACENTESIS MEDICATIONS: 1% lidocaine local COMPLICATIONS: None immediate. PROCEDURE: An ultrasound guided thoracentesis was thoroughly discussed with the patient and questions answered. The benefits, risks, alternatives and complications were also discussed. The patient understands and wishes to proceed with the procedure. Written consent was obtained. Ultrasound was performed to localize and mark an adequate pocket of fluid in the right chest. The area was then prepped and draped in the normal sterile fashion. 1% Lidocaine was used for local anesthesia. Under ultrasound guidance a 6 Fr Safe-T-Centesis catheter was introduced. Thoracentesis was performed. The catheter was removed and a dressing applied. FINDINGS: A total of approximately 1.2 L of amber colored pleural fluid was removed. Samples were sent to the laboratory as requested by the clinical team. IMPRESSION: Successful ultrasound guided right thoracentesis yielding 1.2 L of pleural fluid. Electronically Signed   By: Jerilynn Mages.  Shick M.D.   On: 02/07/2019 10:10    Assessment and plan-  Patient is a 78 y.o. female with newly diagnosed stage IV lung adenocarcinoma currently admitted due to altered mental status.  #Altered mental status, secondary to acute respiratory failure, underlying infection. Mental status have significantly improved. CT head negative for acute abnormalities. She was initially admitted to ICU, currently transferred out.  #New onset of atrial fibrillation with RVR Patient is on Lovenox for anticoagulation.  Cardiology following.  #Hyponatremia  secondary to SIADH.  Improved. #Stage IV lung adenocarcinoma with right-sided pleural effusion Patient has recurrent pleural effusion status post thoracentesis again today, and drained 800 cc of bloody pleural fluid. I had a lengthy discussion with patient's daughter Marciano Sequin who is patient's power of attorney. Prognosis is poor.  Patient has had recurrent pleural effusion, recurrent admission due to multiple comorbidities.  She appears intermittently confused from time to time, not able to have meaningful conversation today. Goal of care was discussed with daughter.  She has multiple questions and all questions were addressed. I recommend insertion of pleural catheter to facilitate removing of recurrent pleural effusion.  Daughter has not decided whether to proceed with comfort care from this point or see if patient will be able to recover to acceptable condition to receive outpatient anti-neoplasm treatments.  Given patient's performance status, she would not be a candidate for chemotherapy.  But if she desires treatment and able to remains stable outpatient, she may be a candidate for immunotherapy.  Molecular testing pending. Agree with palliative consultation.  #Bacteremia /UTI continue Rocephin.  ID consulted.  Appreciate input. #Anemia, hemoglobin trending down.  MCV trended down as well.  Check retic panel.  Plan was discussed with Dr.Ojie via secure chat.   thank you for allowing me to  participate in the care of this patient.    Earlie Server, MD, PhD Hematology Oncology [facility] at [facility] Pager- [phone] 02/21/2019

## 2019-02-21 NOTE — Progress Notes (Signed)
Notify Dr. Jannifer Franklin about patient being restless tonight, ask for sleeping aid, order for Trazodone given.

## 2019-02-21 NOTE — Consult Note (Signed)
Consultation Note Date: 02/21/2019   Patient Name: Cathy Buck  DOB: 1940/12/13  MRN: [id number]  Age / Sex: 78 y.o., female  PCP: Dayton Martes Victoriano Lain, NP Referring Physician: Otila Back, MD  Reason for Consultation: Establishing goals of care and Psychosocial/spiritual support  HPI/Patient Profile: 78 y.o. 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  Recent hospitalization at  [facility] from 02/03/2019 - 02/14/2019 progressive weakness and hyponatremia (sodium 113).  CT scan revealed a moderately large right-sided pleural effusion with consolidation of the right lower lobe and prominent mediastinal adenopathy.  Head MRI without contrast revealed no abnormality.   She underwent right sided thoracentesis on 02/07/2019.  Cytology confirmed adenocarcinoma consistent with lung origin.  She was discharged to [facility].    The patient's daughter notes that her health has declined since that  discharge.  She has been increasingly more confused over the past 2 days.  She was seen in the oncology clinic by Dr. Tasia Catchings 02/18/2019.  She appeared confused and was unable to provide any history.  Blood pressure was 87/55.  She was referred to the emergency room.  Patient is currently alert but confused and with no insight into reason for hospitalization.  Patient and family face treatment option decisions, advanced directive decisions and anticipatory care needs.   Clinical Assessment and Goals of Care:  This NP Wadie Lessen reviewed medical records, received report from team, assessed the patient and then meet at the patient's bedside along with her daughter/ Lin Landsman to discuss diagnosis, prognosis, GOC, EOL wishes disposition and options.  Concept of Hospice and Palliative Care were discussed  A detailed discussion was had today regarding advanced directives.  Concepts specific to code status, artifical feeding and hydration, continued IV antibiotics and rehospitalization was had.  The difference between a aggressive medical intervention path  and a palliative comfort care path for this patient at this time was had.  Values and goals of care important to patient and family were attempted to be elicited.  Daughter verbalizes her family's main goal for this patient is "to be comfortable and not suffer". As a family they are questioning the risks and benefits  of oncology work-up and treatments.  Daughter verbalizes her concern for her father who is himself elderly and frail.  MOST form introduced   Questions and concerns addressed.   Family encouraged to call with questions or concerns.    PMT will continue to support holistically.     According to daughter, patient's husband is the main decision maker, with the support of his family.  Lin Landsman is backup decision maker   SUMMARY OF RECOMMENDATIONS    Code Status/Advance Care Planning:  DNR   Palliative Prophylaxis:   Aspiration, Bowel Regimen, Delirium Protocol, Frequent Pain Assessment and Oral Care  Additional Recommendations (Limitations, Scope, Preferences):  Full  Scope Treatment  Psycho-social/Spiritual:   Desire for further Chaplaincy support:no  Additional Recommendations: Education on Hospice  Prognosis:   Unable to determine- will depend on decision for oncology treatment/ life prolongs measures   Discharge Planning: To Be Determined      Primary Diagnoses: Present on Admission: . Altered mental status   I have reviewed the medical record, interviewed the patient and family, and examined the patient. The following aspects are pertinent.  Past Medical  History:  Diagnosis Date  . Diabetes mellitus without complication ([place])   . Hypertension    Social History   Socioeconomic History  . Marital status: Married    Spouse name: Not on file  . Number of children: Not on file  . Years of education: Not on file  . Highest education level: Not on file  Occupational History  . Not on file  Social Needs  . Financial resource strain: Not hard at all  . Food insecurity    Worry: Never true    Inability: Never true  . Transportation needs    Medical: No    Non-medical: No  Tobacco Use  . Smoking status: Former Research scientist (life sciences)  . Smokeless tobacco: Never Used  Substance and Sexual Activity  . Alcohol use: Not Currently  . Drug use: Not Currently  . Sexual activity: Not Currently  Lifestyle  . Physical activity    Days per week: 0 days    Minutes per session: 0 min  . Stress: Not at all  Relationships  . Social Herbalist on phone: Once a week    Gets together: Once a week    Attends religious service: 1 to 4 times per year    Active member of club or organization: No    Attends meetings of clubs or organizations: Never    Relationship status: Married  Other Topics Concern  . Not on file  Social History Narrative   Lives at home with husband.  Uses cane, walker and rollator a times   No family history on file. Scheduled Meds: . amiodarone  200 mg Oral BID  . budesonide (PULMICORT) nebulizer solution  0.5 mg Nebulization BID  . calcium-vitamin D  1 tablet Oral Q breakfast  . enoxaparin (LOVENOX) injection  100 mg Subcutaneous BID  . feeding supplement (PRO-STAT SUGAR FREE 64)  30 mL Oral Daily  . ipratropium-albuterol  3 mL Nebulization Q4H  . methylPREDNISolone (SOLU-MEDROL) injection  40 mg Intravenous Q12H  . multivitamin with minerals  1 tablet Oral Daily  . pantoprazole  40 mg Oral Daily  . polyethylene glycol  17 g Oral Daily  . vitamin C  250 mg Oral BID   Continuous Infusions: . sodium chloride 50 mL  (02/18/19 2259)  . cefTRIAXone (ROCEPHIN)  IV Stopped (02/20/19 2141)  . potassium PHOSPHATE IVPB (in mmol)     PRN Meds:.sodium chloride, morphine injection Medications Prior to Admission:  Prior to Admission medications   Medication Sig Start Date End Date Taking? Authorizing Provider  albuterol (VENTOLIN HFA) 108 (90 Base) MCG/ACT inhaler Inhale 1 puff into the lungs every 6 (six) hours as needed for wheezing or shortness of breath.   Yes [provider]  Amino Acids-Protein Hydrolys (FEEDING SUPPLEMENT, PRO-STAT SUGAR FREE 64,) LIQD Take 30 mLs by mouth daily.   Yes [provider]  atenolol (TENORMIN) 50 MG tablet Take 50 mg by mouth daily. 11/22/18  Yes [provider]  Calcium Carb-Cholecalciferol (CALCIUM-VITAMIN D) 500-200 MG-UNIT tablet  Take 1 tablet by mouth 2 (two) times a day.   Yes [provider]  Multiple Vitamin (MULTIVITAMIN WITH MINERALS) TABS tablet Take 1 tablet by mouth daily.   Yes [provider]  omeprazole (PRILOSEC) 40 MG capsule Take 40 mg by mouth daily. 30 minutes before food and meds 01/18/19 01/18/20 Yes [provider]  polyethylene glycol (MIRALAX / GLYCOLAX) 17 g packet Take 17 g by mouth daily. 02/15/19  Yes Wieting, Richard, MD  vitamin C (ASCORBIC ACID) 250 MG tablet Take 250 mg by mouth 2 (two) times daily.   Yes [provider]   Allergies  Allergen Reactions  . Codeine Hives  . Atorvastatin Other (See Comments)  . Citalopram Other (See Comments)    Nausea  . Keflex [Cephalexin] Diarrhea  . Tylenol [Acetaminophen] Hives   Review of Systems  Unable to perform ROS: Mental status change    Physical Exam Constitutional:      Appearance: Normal appearance. She is ill-appearing.  Cardiovascular:     Rate and Rhythm: Normal rate and regular rhythm.  Skin:    General: Skin is warm and dry.  Neurological:     Mental Status: She is alert.  Psychiatric:        Cognition and Memory: Cognition  is impaired.     Vital Signs: BP 133/65   Pulse 91   Temp 98.4 F (36.9 C) (Oral)   Resp 17   Wt 102.8 kg   SpO2 95%   BMI 38.90 kg/m  Pain Scale: 0-10   Pain Score: 0-No pain   SpO2: SpO2: 95 % O2 Device:SpO2: 95 % O2 Flow Rate: .O2 Flow Rate (L/min): 4 L/min  IO: Intake/output summary:   Intake/Output Summary (Last 24 hours) at 02/21/2019 1017 Last data filed at 02/21/2019 0609 Gross per 24 hour  Intake 850.3 ml  Output 1125 ml  Net -274.7 ml    LBM: Last BM Date: 02/19/19 Baseline Weight: Weight: 108 kg Most recent weight: Weight: 102.8 kg     Palliative Assessment/Data: 30 %    Discussed with Dr Dewayne Hatch and Dr Stark Jock  PMT will continue to support holistically   Time In: 1300 Time Out: 1415 Time Total: 75 minutes Greater than 50%  of this time was spent counseling and coordinating care related to the above assessment and plan.  Signed by: Wadie Lessen, NP   Please contact Palliative Medicine Team phone at [phone] for questions and concerns.  For individual provider: See Shea Evans

## 2019-02-21 NOTE — Progress Notes (Signed)
OT Cancellation Note  Patient Details Name: Cathy Buck MRN: [id number] DOB: Nov 15, 1940   Cancelled Treatment:    Reason Eval/Treat Not Completed: Medical issues which prohibited therapy. Pt with transfer to CCU over the weekend. Secondary to change in pt status, will complete current OT order. Will require new therapy orders once/if pt becomes medically appropriate to participate.   Jeni Salles, MPH, MS, OTR/L ascom [phone] 02/21/19, 7:51 AM

## 2019-02-21 NOTE — Plan of Care (Signed)
  Problem: Education: Goal: Knowledge of General Education information will improve Description: Including pain rating scale, medication(s)/side effects and non-pharmacologic comfort measures Outcome: Not Progressing Note: Patient has not answered a single one of my questions today. All she does is look and laugh at everybody and then starts rambling. Apparently this behavior is greatly improved from yesterday, per report. Patient also just rippped both restraint mitts off. Will continue to monitor neurological status for the remainder of the shift. Wenda Low [facility]

## 2019-02-21 NOTE — Progress Notes (Signed)
[facility] [place] NAME: Cathy Buck    MR#:  [id number]  DATE OF BIRTH:  12/30/1940  SUBJECTIVE:  CHIEF COMPLAINT:   Chief Complaint  Patient presents with  . Altered Mental Status   No new complaint this morning.  No fevers.  Patient awake and alert and oriented this morning. REVIEW OF SYSTEMS:  Review of Systems  Constitutional: Negative for chills and fever.  HENT: Negative for hearing loss and tinnitus.   Eyes: Negative for blurred vision and double vision.  Respiratory: Negative for cough and shortness of breath.   Cardiovascular: Negative for chest pain and palpitations.  Gastrointestinal: Negative for abdominal pain, heartburn, nausea and vomiting.  Genitourinary: Negative for dysuria and urgency.  Musculoskeletal: Negative for myalgias.  Skin: Negative for itching and rash.  Neurological: Negative for dizziness and headaches.  Psychiatric/Behavioral: Negative for depression and substance abuse.    DRUG ALLERGIES:   Allergies  Allergen Reactions  . Codeine Hives  . Atorvastatin Other (See Comments)  . Citalopram Other (See Comments)    Nausea  . Keflex [Cephalexin] Diarrhea  . Tylenol [Acetaminophen] Hives   VITALS:  Blood pressure (!) 148/105, pulse 81, temperature 98.3 F (36.8 C), resp. rate 19, weight 102.8 kg, SpO2 95 %. PHYSICAL EXAMINATION:  Physical Exam  GENERAL:  78 y.o.-year-old patient lying in the bed with no acute distress.  EYES: Pupils equal, round, reactive to light and accommodation. No scleral icterus. Extraocular muscles intact.  HEENT: Head atraumatic, normocephalic. Oropharynx and nasopharynx clear.  NECK:  Supple, no jugular venous distention. No thyroid enlargement, no tenderness.  LUNGS: Moderately diminished breath sounds bilaterally, no wheezing, rales,rhonchi or crepitation. No use of accessory muscles of respiration.  CARDIOVASCULAR: S1, S2 normal. No murmurs, rubs, or gallops.   ABDOMEN: Soft, nontender, nondistended. Bowel sounds present.  EXTREMITIES: No pedal edema, cyanosis, or clubbing.  NEUROLOGIC: Patient awake and alert and oriented this morning.  Gait not checked for safety PSYCHIATRIC: Patient awake and alert and oriented this morning x3 SKIN: No obvious rash, lesion, or ulcer.   LABORATORY PANEL:  Female CBC Recent Labs  Lab 02/20/19 0422  WBC 11.2*  HGB 9.8*  HCT 32.1*  PLT 219   ------------------------------------------------------------------------------------------------------------------ Chemistries  Recent Labs  Lab 02/18/19 0941  02/21/19 0539  NA 129*   < > 140  K 5.2*   < > 3.5  CL 83*   < > 91*  CO2 34*   < > 38*  GLUCOSE 152*   < > 236*  BUN 48*   < > 28*  CREATININE 0.94   < > 0.43*  CALCIUM 9.7   < > 9.6  MG  --    < > 2.3  AST 37  --   --   ALT 49*  --   --   ALKPHOS 81  --   --   BILITOT 0.8  --   --    < > = values in this interval not displayed.   RADIOLOGY:  Dg Chest Port 1 View  Result Date: 02/21/2019 CLINICAL DATA:  Status post right thoracentesis. EXAM: PORTABLE CHEST 1 VIEW COMPARISON:  Radiographs of February 19, 2019. FINDINGS: Stable cardiomediastinal silhouette. Atherosclerosis of thoracic aorta is noted. No pneumothorax or significant pleural effusion is noted. Left lung is clear. Improved right basilar opacity is noted, with residual atelectasis or infiltrate. Bony thorax is unremarkable. IMPRESSION: No pneumothorax is noted. No significant pleural effusion is noted. Mild  right basilar opacity is noted as described above. Electronically Signed   By: Marijo Conception M.D.   On: 02/21/2019 12:57   US Thoracentesis Asp Pleural Space W/img Guide  Result Date: 02/21/2019 INDICATION: Pleural effusion. EXAM: ULTRASOUND GUIDED RIGHT THORACENTESIS MEDICATIONS: None. COMPLICATIONS: None immediate. PROCEDURE: An ultrasound guided thoracentesis was thoroughly discussed with the patient and questions answered. The benefits,  risks, alternatives and complications were also discussed. The patient understands and wishes to proceed with the procedure. Written consent was obtained. Ultrasound was performed to localize and mark an adequate pocket of fluid in the right chest. The area was then prepped and draped in the normal sterile fashion. 1% Lidocaine was used for local anesthesia. Under ultrasound guidance a 6 French catheter was introduced. Thoracentesis was performed. The catheter was removed and a dressing applied. FINDINGS: A total of approximately 800 cc of bloody fluid was removed. Samples were sent to the laboratory as requested by the clinical team. IMPRESSION: Successful ultrasound guided right thoracentesis yielding 800 cc of bloody pleural fluid. Electronically Signed   By: Marcello Moores  Register   On: 02/21/2019 12:10   ASSESSMENT AND PLAN:   #Acute metabolic encephalopathy from acute hypoxemic and hypercapnic respiratory failure and , underlying UTI Patient already weaned off BiPAP. Treatment of infectious process as detailed below. CT head negative abnormalities Patient transferred out of ICU today.  #New onset A. fib with RVR-initially with low BP now hemodynamically stable Rate controlled.  TSH level normal. -RecentTTEcho7/2020 with LVEF 60-65% Patient on therapeutic dose of Lovenox.  Follow-up cardiology input  #.Hyponatremia-likely secondary to SIADH and adenocarcinoma of the pleural space -Nephrology consult placed and discussed with Dr. 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  Oncology Dr. Mike Gip has seen the patient -Patient was on concurrent chemotherapy and immunotherapy prior to this admission -Plan PET scan as outpatient once stable  #Acute cystitis urine culture greater than 100,000 colonies of Klebsiella ornithinolytica sensitive to Rocephin.   Continue Rocephin  #.  Proteus mirabilis bacteremia Also sensitive to ceftriaxone. Continue the same  #.Type 2 diabetes mellitus -sliding scale  #.Thromboembolism Risk Management -Lovenoxper pharmacy protocolpending cardiology evaluation  DVT prophylaxis; patient already on Lovenox  All the records are reviewed and case discussed with Care Management/Social Worker. Management plans discussed with the patient, family and they are in agreement. Palliative care to meet with patient and family later today.  CODE STATUS: DNR  TOTAL TIME TAKING CARE OF THIS PATIENT: 36 minutes.   More than 50% of the time was spent in counseling/coordination of care: YES  POSSIBLE D/C IN 3 DAYS, DEPENDING ON CLINICAL CONDITION.   Artavia Jeanlouis M.D on 02/21/2019 at 1:30 PM  Between 7am to 6pm - Pager - [phone]  After 6pm go to [email or web address] - Proofreader  [facility]  Office  [phone]  CC: Primary care physician; Sallee Lange, NP  Note: This dictation was prepared with Dragon dictation along with smaller phrase technology. Any transcriptional errors that result from this process are unintentional.

## 2019-02-21 NOTE — Progress Notes (Signed)
PHARMACY CONSULT NOTE - FOLLOW UP  Pharmacy Consult for Electrolyte Monitoring and Replacement   Recent Labs: Potassium (mmol/L)  Date Value  02/21/2019 3.5   Magnesium (mg/dL)  Date Value  02/21/2019 2.3   Calcium (mg/dL)  Date Value  02/21/2019 9.6   Albumin (g/dL)  Date Value  02/18/2019 2.9 (L)   Phosphorus (mg/dL)  Date Value  02/21/2019 2.4 (L)   Sodium (mmol/L)  Date Value  02/21/2019 140     Assessment: -Hypophosphotemia still present but improving -K trending down   Goal of Therapy:  Replete electrolytes to normal levels  Plan:  Will order K Phos 58mmol IV once. Follow up on AM labs.  Paulina Fusi, PharmD, BCPS 02/21/2019 11:33 AM

## 2019-02-21 NOTE — Progress Notes (Signed)
Talked to patient's daughter Roland Rack and update her about the plan of care for this patient.

## 2019-02-22 ENCOUNTER — Ambulatory Visit: Payer: Medicare Other

## 2019-02-22 DIAGNOSIS — R531 Weakness: Secondary | ICD-10-CM

## 2019-02-22 DIAGNOSIS — D508 Other iron deficiency anemias: Secondary | ICD-10-CM

## 2019-02-22 DIAGNOSIS — R05 Cough: Secondary | ICD-10-CM

## 2019-02-22 DIAGNOSIS — R059 Cough, unspecified: Secondary | ICD-10-CM

## 2019-02-22 DIAGNOSIS — Z9889 Other specified postprocedural states: Secondary | ICD-10-CM

## 2019-02-22 LAB — BASIC METABOLIC PANEL
Anion gap: 10 (ref 5–15)
BUN: 23 mg/dL (ref 8–23)
CO2: 37 mmol/L — ABNORMAL HIGH (ref 22–32)
Calcium: 9.4 mg/dL (ref 8.9–10.3)
Chloride: 91 mmol/L — ABNORMAL LOW (ref 98–111)
Creatinine, Ser: 0.5 mg/dL (ref 0.44–1.00)
GFR calc Af Amer: >60 mL/min (ref >60)
GFR calc non Af Amer: >60 mL/min (ref >60)
Glucose, Bld: 178 mg/dL — ABNORMAL HIGH (ref 70–99)
Potassium: 3.4 mmol/L — ABNORMAL LOW (ref 3.5–5.1)
Sodium: 138 mmol/L (ref 135–145)

## 2019-02-22 LAB — PHOSPHORUS: Phosphorus: 1.9 mg/dL — ABNORMAL LOW (ref 2.5–4.6)

## 2019-02-22 LAB — CBC
HCT: 31.8 % — ABNORMAL LOW (ref 36.0–46.0)
Hemoglobin: 10.2 g/dL — ABNORMAL LOW (ref 12.0–15.0)
MCH: 24.1 pg — ABNORMAL LOW (ref 26.0–34.0)
MCHC: 32.1 g/dL (ref 30.0–36.0)
MCV: 75 fL — ABNORMAL LOW (ref 80.0–100.0)
Platelets: 201 10*3/uL (ref 150–400)
RBC: 4.24 MIL/uL (ref 3.87–5.11)
RDW: 15.5 % (ref 11.5–15.5)
WBC: 20.1 10*3/uL — ABNORMAL HIGH (ref 4.0–10.5)
nRBC: 0.2 % (ref 0.0–0.2)

## 2019-02-22 LAB — PH, BODY FLUID: pH, Body Fluid: 7.7

## 2019-02-22 LAB — RETIC PANEL
Immature Retic Fract: 23.6 % — ABNORMAL HIGH (ref 2.3–15.9)
RBC.: 4.24 MIL/uL (ref 3.87–5.11)
Retic Count, Absolute: 83.1 10*3/uL (ref 19.0–186.0)
Retic Ct Pct: 2 % (ref 0.4–3.1)
Reticulocyte Hemoglobin: 22.1 pg — ABNORMAL LOW (ref >27.9)

## 2019-02-22 LAB — MAGNESIUM: Magnesium: 2.2 mg/dL (ref 1.7–2.4)

## 2019-02-22 LAB — TRIGLYCERIDES, BODY FLUIDS: Triglycerides, Fluid: 26 mg/dL

## 2019-02-22 LAB — PATHOLOGIST SMEAR REVIEW

## 2019-02-22 MED ORDER — POTASSIUM PHOSPHATES 15 MMOLE/5ML IV SOLN
20.0000 mmol | Freq: Once | INTRAVENOUS | Status: AC
  Administered 2019-02-22: 09:00:00 20 mmol via INTRAVENOUS
  Filled 2019-02-22: qty 6.67

## 2019-02-22 MED ORDER — IPRATROPIUM-ALBUTEROL 0.5-2.5 (3) MG/3ML IN SOLN
3.0000 mL | Freq: Four times a day (QID) | RESPIRATORY_TRACT | Status: DC
  Administered 2019-02-22 (×2): 3 mL via RESPIRATORY_TRACT
  Filled 2019-02-22 (×2): qty 3

## 2019-02-22 MED ORDER — FERROUS SULFATE 325 (65 FE) MG PO TABS
325.0000 mg | ORAL_TABLET | Freq: Two times a day (BID) | ORAL | Status: DC
  Administered 2019-02-23 – 2019-03-07 (×24): 325 mg via ORAL
  Filled 2019-02-22 (×24): qty 1

## 2019-02-22 MED ORDER — IPRATROPIUM-ALBUTEROL 0.5-2.5 (3) MG/3ML IN SOLN
3.0000 mL | Freq: Three times a day (TID) | RESPIRATORY_TRACT | Status: DC
  Administered 2019-02-22 – 2019-03-01 (×18): 3 mL via RESPIRATORY_TRACT
  Filled 2019-02-22 (×18): qty 3

## 2019-02-22 MED ORDER — ENOXAPARIN SODIUM 100 MG/ML ~~LOC~~ SOLN: 100.0000 mg | Freq: Two times a day (BID) | SUBCUTANEOUS | Status: DC

## 2019-02-22 MED ORDER — POTASSIUM CHLORIDE CRYS ER 20 MEQ PO TBCR
40.0000 meq | EXTENDED_RELEASE_TABLET | Freq: Once | ORAL | Status: AC
  Administered 2019-02-22: 09:00:00 40 meq via ORAL
  Filled 2019-02-22: qty 2

## 2019-02-22 NOTE — Progress Notes (Signed)
[facility] NAME: Cathy Buck    MR#:  [id number]  DATE OF BIRTH:  1941/04/16  SUBJECTIVE:  CHIEF COMPLAINT:   Chief Complaint  Patient presents with  . Altered Mental Status   No new complaint this morning.  No fevers.  Patient awake and alert and oriented this morning. REVIEW OF SYSTEMS:  Review of Systems  Constitutional: Negative for chills and fever.  HENT: Negative for hearing loss and tinnitus.   Eyes: Negative for blurred vision and double vision.  Respiratory: Negative for cough and shortness of breath.   Cardiovascular: Negative for chest pain and palpitations.  Gastrointestinal: Negative for abdominal pain, heartburn, nausea and vomiting.  Genitourinary: Negative for dysuria and urgency.  Musculoskeletal: Negative for myalgias.  Skin: Negative for itching and rash.  Neurological: Negative for dizziness and headaches.  Psychiatric/Behavioral: Negative for depression and substance abuse.    DRUG ALLERGIES:   Allergies  Allergen Reactions  . Codeine Hives  . Atorvastatin Other (See Comments)  . Citalopram Other (See Comments)    Nausea  . Keflex [Cephalexin] Diarrhea  . Tylenol [Acetaminophen] Hives   VITALS:  Blood pressure (!) 139/58, pulse 89, temperature 98.6 F (37 C), temperature source Oral, resp. rate 18, weight 102.4 kg, SpO2 93 %. PHYSICAL EXAMINATION:  Physical Exam  GENERAL:  78 y.o.-year-old patient lying in the bed with no acute distress.  EYES: Pupils equal, round, reactive to light and accommodation. No scleral icterus. Extraocular muscles intact.  HEENT: Head atraumatic, normocephalic. Oropharynx and nasopharynx clear.  NECK:  Supple, no jugular venous distention. No thyroid enlargement, no tenderness.  LUNGS: Moderately diminished breath sounds bilaterally, no wheezing, rales,rhonchi or crepitation. No use of accessory muscles of respiration.  CARDIOVASCULAR: S1, S2 normal. No murmurs,  rubs, or gallops.  ABDOMEN: Soft, nontender, nondistended. Bowel sounds present.  EXTREMITIES: No pedal edema, cyanosis, or clubbing.  NEUROLOGIC: Patient awake and alert and oriented this morning.  Gait not checked for safety PSYCHIATRIC: Patient awake and alert and oriented this morning x3 SKIN: No obvious rash, lesion, or ulcer.   LABORATORY PANEL:  Female CBC Recent Labs  Lab 02/22/19 0501  WBC 20.1*  HGB 10.2*  HCT 31.8*  PLT 201   ------------------------------------------------------------------------------------------------------------------ Chemistries  Recent Labs  Lab 02/18/19 0941  02/22/19 0501  NA 129*   < > 138  K 5.2*   < > 3.4*  CL 83*   < > 91*  CO2 34*   < > 37*  GLUCOSE 152*   < > 178*  BUN 48*   < > 23  CREATININE 0.94   < > 0.50  CALCIUM 9.7   < > 9.4  MG  --    < > 2.2  AST 37  --   --   ALT 49*  --   --   ALKPHOS 81  --   --   BILITOT 0.8  --   --    < > = values in this interval not displayed.   RADIOLOGY:  No results found. ASSESSMENT AND PLAN:   #Acute metabolic encephalopathy from acute hypoxemic and hypercapnic respiratory failure and , underlying UTI Patient already weaned off BiPAP. Treatment of infectious process as detailed below. CT head negative abnormalities Patient transferred out of ICU  #New onset A. fib with RVR-initially with low BP now hemodynamically stable Rate controlled.  TSH level normal. -RecentTTEcho7/2020 with LVEF 60-65% Patient on therapeutic dose of Lovenox.  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  Oncology Cathy Buck has seen the patient -Patient was on concurrent chemotherapy and immunotherapy prior to this  admission -Plan PET scan as outpatient once stable I discussed with oncologist Cathy Buck who recommended Pleurx chest catheter to prevent recurrent pleural effusions for palliation.  Patient's daughter Cathy Buck who is the healthcare power of attorney agrees with the same. I have placed consult for cardiothoracic surgeon to place the Pleurx catheter.  Interventional radiologist stated this is usually done in the OR by cardiothoracic surgeon.  #Acute cystitis urine culture greater than 100,000 colonies of Klebsiella ornithinolytica sensitive to Rocephin.  Continue Rocephin  #.  Proteus mirabilis bacteremia Also sensitive to ceftriaxone.  #.Type 2 diabetes mellitus -sliding scale  #.Thromboembolism Risk Management -Lovenoxper pharmacy protocolpending cardiology evaluation  DVT prophylaxis; patient already on Lovenox  All the records are reviewed and case discussed with Care Management/Social Worker. Management plans discussed with the patient, family and they are in agreement. Palliative care to meet with patient and family later today. I called patient's daughter Cathy Buck; [phone] and updated her on treatment plans as outlined above.  All questions were answered.  CODE STATUS: DNR  TOTAL TIME TAKING CARE OF THIS PATIENT: 36 minutes.   More than 50% of the time was spent in counseling/coordination of care: YES  POSSIBLE D/C IN 3 DAYS, DEPENDING ON CLINICAL CONDITION.   Cathy Buck M.D on 02/22/2019 at 1:29 PM  Between 7am to 6pm - Pager - [phone]  After 6pm go to [email or web address] - Proofreader  [facility]  Office  [phone]  CC: Primary care physician; Cathy Lange, NP  Note: This dictation was prepared with Dragon dictation along with smaller phrase technology. Any transcriptional errors that result from this process are unintentional.

## 2019-02-22 NOTE — Plan of Care (Signed)
  Problem: Clinical Measurements: Goal: Cardiovascular complication will be avoided Outcome: Progressing   Problem: Nutrition: Goal: Adequate nutrition will be maintained Outcome: Progressing   Problem: Elimination: Goal: Will not experience complications related to bowel motility Outcome: Progressing   Problem: Skin Integrity: Goal: Risk for impaired skin integrity will decrease Outcome: Progressing

## 2019-02-22 NOTE — Care Management Important Message (Signed)
Important Message  Patient Details  Name: Cathy Buck MRN: [id number] Date of Birth: 01-17-1941   Medicare Important Message Given:  Yes     Juliann Pulse A Destin Kittler 02/22/2019, 11:33 AM

## 2019-02-22 NOTE — Progress Notes (Signed)
RT protocol assessment score was 10. Because of this, the nebulizer treatments are to be changed to TID.

## 2019-02-22 NOTE — Progress Notes (Signed)
Patient ID: Elenore Paddy Kapral, female   DOB: 07-29-1940, 78 y.o.   MRN: [id number]  This NP visited patient at the bedside as a follow up to  yesterday's [place], and  to meet by conference call with patient's husband and children.  Connie/daughter is present at bedside   Continued conversation regarding diagnosis/treatment options, prognosis, goals of care, end-of-life wishes, disposition and options.  We discussed concept specific to human mortality and quality of life.  All family members agree that quality is a priority and they do not want the patient to "suffer".  At this time family is looking to "alittle more time" to see how Ms Nastasi will do over the next several weeks.  Plan of care: -DNR/DNI -Pleur-ex  Cath placed for symptom management   -SNF for short term rehab with  palliative care services    Future decisions will depend on how the patient progresses.  If she improves family will consider oncology treatments. If she fails to thrive, a shift to comfort will be put in place, along with hospice services.  Discussed with patient the importance of continued conversation with each other and the medical providers regarding overall plan of care and treatment options,  ensuring decisions are within the context of the patients values and GOCs.  Questions and concerns addressed   FMLA paper work completed for daughter.  Total time spent on the unit was 60 minutes  Greater than 50% of the time was spent in counseling and coordination of care  Wadie Lessen NP  Palliative Medicine Team Team Phone # [phone] Pager [phone]

## 2019-02-22 NOTE — Consult Note (Addendum)
[name] for Enoxaparin Indication: atrial fibrillation  Allergies  Allergen Reactions  . Codeine Hives  . Atorvastatin Other (See Comments)  . Citalopram Other (See Comments)    Nausea  . Keflex [Cephalexin] Diarrhea  . Tylenol [Acetaminophen] Hives    Patient Measurements: Weight: 225 lb 11.2 oz (102.4 kg)  Vital Signs: Temp: 98.6 F (37 C) (08/11 0742) Temp Source: Oral (08/11 0742) BP: 139/58 (08/11 0742) Pulse Rate: 89 (08/11 0742)  Labs: Recent Labs    02/20/19 0422 02/21/19 0539 02/22/19 0501  HGB 9.8*  --  10.2*  HCT 32.1*  --  31.8*  PLT 219  --  201  CREATININE 0.44 0.43* 0.50    Estimated Creatinine Clearance: 67.5 mL/min (by C-G formula based on SCr of 0.5 mg/dL).   Medical History: Past Medical History:  Diagnosis Date  . Diabetes mellitus without complication ([place])   . Hypertension     Medications:  Medications Prior to Admission  Medication Sig Dispense Refill Last Dose  . albuterol (VENTOLIN HFA) 108 (90 Base) MCG/ACT inhaler Inhale 1 puff into the lungs every 6 (six) hours as needed for wheezing or shortness of breath.   prn at prn  . Amino Acids-Protein Hydrolys (FEEDING SUPPLEMENT, PRO-STAT SUGAR FREE 64,) LIQD Take 30 mLs by mouth daily.   02/17/2019 at 0900  . atenolol (TENORMIN) 50 MG tablet Take 50 mg by mouth daily.   02/17/2019 at 0900  . Calcium Carb-Cholecalciferol (CALCIUM-VITAMIN D) 500-200 MG-UNIT tablet Take 1 tablet by mouth 2 (two) times a day.   02/17/2019 at 1800  . Multiple Vitamin (MULTIVITAMIN WITH MINERALS) TABS tablet Take 1 tablet by mouth daily.   02/17/2019 at 0900  . omeprazole (PRILOSEC) 40 MG capsule Take 40 mg by mouth daily. 30 minutes before food and meds   02/17/2019 at 0600  . polyethylene glycol (MIRALAX / GLYCOLAX) 17 g packet Take 17 g by mouth daily. 30 each 0 02/17/2019 at 0900  . vitamin C (ASCORBIC ACID) 250 MG tablet Take 250 mg by mouth 2 (two) times daily.   02/17/2019 at  Unknown time   Scheduled:  . amiodarone  200 mg Oral BID  . budesonide (PULMICORT) nebulizer solution  0.5 mg Nebulization BID  . calcium-vitamin D  1 tablet Oral Q breakfast  . enoxaparin (LOVENOX) injection  100 mg Subcutaneous BID  . feeding supplement (PRO-STAT SUGAR FREE 64)  30 mL Oral Daily  . ipratropium-albuterol  3 mL Nebulization QID  . methylPREDNISolone (SOLU-MEDROL) injection  40 mg Intravenous Q12H  . multivitamin with minerals  1 tablet Oral Daily  . pantoprazole  40 mg Oral Daily  . polyethylene glycol  17 g Oral Daily  . vitamin C  250 mg Oral BID   Infusions:  . sodium chloride 50 mL (02/18/19 2259)  . cefTRIAXone (ROCEPHIN)  IV 2 g (02/21/19 2149)  . potassium PHOSPHATE IVPB (in mmol) 20 mmol (02/22/19 0914)   PRN: sodium chloride, morphine injection, traZODone Anti-infectives (From admission, onward)   Start     Dose/Rate Route Frequency Ordered Stop   02/18/19 2130  cefTRIAXone (ROCEPHIN) 2 g in sodium chloride 0.9 % 100 mL IVPB     2 g 200 mL/hr over 30 Minutes Intravenous Every 24 hours 02/18/19 2117        Assessment: Pharmacy consulted to start enoxaparin for afib. No DOAC PTA. Cardiology consult pending.   Addendum 02/22/19@1303 : Dr. Stark Jock discussed possible procedure/consult with cardiothoraic tomorrow. Treatment enoxaparin  dose would need to be held.  Goal of Therapy:  Monitor platelets by anticoagulation protocol: Yes   Plan:  Lovenox 100 mg q12H. Next dose that was due at midnight was held for today.   Rowland Lathe, PharmD 02/22/2019,9:42 AM

## 2019-02-22 NOTE — Progress Notes (Addendum)
Hematology/Oncology Progress Note [facility] Telephone:([phone] [phone]  Patient Care Team: Sallee Lange, NP as PCP - General (Internal Medicine) Telford Nab, RN as Registered Nurse   Name of the patient: Cathy Buck  [id number]  November 07, 1940  Date of visit: 02/22/19   INTERVAL HISTORY-  No acute overnight events.  Afebrile.  No family members at bedside. She reports feeling well.  Just finished her lunch. Breathing via nasal cannula oxygen    Review of systems- Review of Systems  Constitutional: Positive for fatigue and unexpected weight change. Negative for appetite change.  Respiratory: Positive for shortness of breath. Negative for cough.   Cardiovascular: Negative for chest pain.  Gastrointestinal: Negative for abdominal distention and abdominal pain.  Genitourinary: Negative for dysuria.   Musculoskeletal: Negative for back pain.  Skin: Negative for rash.  Neurological: Negative for light-headedness.  Hematological: Negative for adenopathy.  Psychiatric/Behavioral: Negative for confusion.    Allergies  Allergen Reactions   Codeine Hives   Atorvastatin Other (See Comments)   Citalopram Other (See Comments)    Nausea   Keflex [Cephalexin] Diarrhea   Tylenol [Acetaminophen] Hives    Patient Active Problem List   Diagnosis Date Noted   Palliative care by specialist    DNR (do not resuscitate)    Goals of care, counseling/discussion    Microcytic anemia    Altered mental status 02/18/2019   Primary malignant neoplasm of lung metastatic to other site [facility])    Pleural effusion    Weakness    Hyponatremia 02/03/2019   HTN (hypertension) 02/03/2019   Diabetes ([place]) 02/03/2019     Past Medical History:  Diagnosis Date   Diabetes mellitus without complication ([place])    Hypertension      Past Surgical History:  Procedure Laterality Date   ABDOMINAL HYSTERECTOMY     CHOLECYSTECTOMY       Social History   Socioeconomic History   Marital status: Married    Spouse name: Not on file   Number of children: Not on file   Years of education: Not on file   Highest education level: Not on file  Occupational History   Not on file  Social Needs   Financial resource strain: Not hard at all   Food insecurity    Worry: Never true    Inability: Never true   Transportation needs    Medical: No    Non-medical: No  Tobacco Use   Smoking status: Former Smoker   Smokeless tobacco: Never Used  Substance and Sexual Activity   Alcohol use: Not Currently   Drug use: Not Currently   Sexual activity: Not Currently  Lifestyle   Physical activity    Days per week: 0 days    Minutes per session: 0 min   Stress: Not at all  Relationships   Social connections    Talks on phone: Once a week    Gets together: Once a week    Attends religious service: 1 to 4 times per year    Active member of club or organization: No    Attends meetings of clubs or organizations: Never    Relationship status: Married   Intimate partner violence    Fear of current or ex partner: No    Emotionally abused: No    Physically abused: No    Forced sexual activity: No  Other Topics Concern   Not on file  Social History Narrative   Lives at home with husband.  Uses cane, walker and rollator a times     No family history on file.   Current Facility-Administered Medications:    0.9 %  sodium chloride infusion, , Intravenous, PRN, Lang Snow, NP, Last Rate: 10 mL/hr at 02/18/19 2259, 50 mL at 02/18/19 2259   amiodarone (PACERONE) tablet 200 mg, 200 mg, Oral, BID, Tyler Pita, MD, 200 mg at 02/22/19 0850   budesonide (PULMICORT) nebulizer solution 0.5 mg, 0.5 mg, Nebulization, BID, Mortimer Fries, Kurian, MD, 0.5 mg at 02/22/19 0751   calcium-vitamin D (OSCAL WITH D) 500-200 MG-UNIT per tablet 1 tablet, 1 tablet, Oral, Q breakfast, Ouma, Bing Neighbors, NP, 1 tablet  at 02/22/19 0850   cefTRIAXone (ROCEPHIN) 2 g in sodium chloride 0.9 % 100 mL IVPB, 2 g, Intravenous, Q24H, Ouma, Bing Neighbors, NP, Stopped at 02/21/19 2220   [START ON 02/24/2019] enoxaparin (LOVENOX) injection 100 mg, 100 mg, Subcutaneous, BID, Ojie, Jude, MD   feeding supplement (PRO-STAT SUGAR FREE 64) liquid 30 mL, 30 mL, Oral, Daily, Ouma, Bing Neighbors, NP, 30 mL at 02/22/19 0915   ipratropium-albuterol (DUONEB) 0.5-2.5 (3) MG/3ML nebulizer solution 3 mL, 3 mL, Nebulization, TID, Ojie, Jude, MD   methylPREDNISolone sodium succinate (SOLU-MEDROL) 40 mg/mL injection 40 mg, 40 mg, Intravenous, Q12H, Kasa, Kurian, MD, 40 mg at 02/22/19 1521   morphine 2 MG/ML injection 2 mg, 2 mg, Intravenous, Q1H PRN, Flora Lipps, MD, 2 mg at 02/19/19 1623   multivitamin with minerals tablet 1 tablet, 1 tablet, Oral, Daily, Ouma, Bing Neighbors, NP, 1 tablet at 02/22/19 0850   pantoprazole (PROTONIX) EC tablet 40 mg, 40 mg, Oral, Daily, Ouma, Bing Neighbors, NP, 40 mg at 02/22/19 0850   polyethylene glycol (MIRALAX / GLYCOLAX) packet 17 g, 17 g, Oral, Daily, Ouma, Bing Neighbors, NP, 17 g at 02/22/19 0850   traZODone (DESYREL) tablet 50 mg, 50 mg, Oral, QHS PRN, Lance Coon, MD, 50 mg at 02/22/19 0123   vitamin C (ASCORBIC ACID) tablet 250 mg, 250 mg, Oral, BID, Lang Snow, NP, 250 mg at 02/22/19 0850   Physical exam:  Vitals:   02/22/19 0751 02/22/19 0902 02/22/19 1127 02/22/19 1704  BP:    (!) 116/57  Pulse:    86  Resp:    18  Temp:    97.7 F (36.5 C)  TempSrc:    Oral  SpO2: 97% 93% 93% 99%  Weight:       Physical Exam  Constitutional: She is oriented to person, place, and time. No distress.  HENT:  Head: Normocephalic and atraumatic.  Eyes: Pupils are equal, round, and reactive to light. No scleral icterus.  Neck: Normal range of motion. Neck supple.  Pulmonary/Chest: Effort normal. No respiratory distress. She has no wheezes.  Decreased breath  sound bilaterally. Breathing via nasal cannula oxygen.  Abdominal: Soft. Bowel sounds are normal.  Musculoskeletal: Normal range of motion.  Neurological: She is alert and oriented to person, place, and time.  Orientated to herself and place.  Skin: Skin is warm.  Psychiatric: Affect normal.       CMP Latest Ref Rng & Units 02/22/2019  Glucose 70 - 99 mg/dL 178(H)  BUN 8 - 23 mg/dL 23  Creatinine 0.44 - 1.00 mg/dL 0.50  Sodium 135 - 145 mmol/L 138  Potassium 3.5 - 5.1 mmol/L 3.4(L)  Chloride 98 - 111 mmol/L 91(L)  CO2 22 - 32 mmol/L 37(H)  Calcium 8.9 - 10.3 mg/dL 9.4  Total Protein 6.5 - 8.1 g/dL -  Total  Bilirubin 0.3 - 1.2 mg/dL -  Alkaline Phos 38 - 126 U/L -  AST 15 - 41 U/L -  ALT 0 - 44 U/L -   CBC Latest Ref Rng & Units 02/22/2019  WBC 4.0 - 10.5 K/uL 20.1(H)  Hemoglobin 12.0 - 15.0 g/dL 10.2(L)  Hematocrit 36.0 - 46.0 % 31.8(L)  Platelets 150 - 400 K/uL 201   RADIOGRAPHIC STUDIES: I have personally reviewed the radiological images as listed and agreed with the findings in the report.  Dg Chest 2 View  Result Date: 02/10/2019 CLINICAL DATA:  Pleural effusion EXAM: CHEST - 2 VIEW COMPARISON:  Two days ago FINDINGS: Right PICC with tip at the right atrium. Haziness in the lower right chest. Hyperinflation with diaphragm flattening. Thickened right hilum. There was adenopathy on preceding chest CT. Normal heart size. IMPRESSION: Unchanged right pleural effusion and lower lobe opacification. Electronically Signed   By: Monte Fantasia M.D.   On: 02/10/2019 10:39   Ct Head Wo Contrast  Result Date: 02/18/2019 CLINICAL DATA:  Altered level of consciousness, unexplained, lethargy, hypotension, diabetes mellitus, hypertension, lung cancer EXAM: CT HEAD WITHOUT CONTRAST TECHNIQUE: Contiguous axial images were obtained from the base of the skull through the vertex without intravenous contrast. Sagittal and coronal MPR images reconstructed from axial data set. COMPARISON:  MR  brain 02/07/2019 FINDINGS: Brain: Mild generalized atrophy. Normal ventricular morphology. No midline shift or mass effect.2 dense calcification of falx. No intracranial hemorrhage, mass lesion or evidence of acute infarction. No extra-axial fluid collections. Vascular: No hyperdense vessels Skull: Intact Sinuses/Orbits: Clear Other: N/A IMPRESSION: No acute intracranial abnormalities. Electronically Signed   By: Lavonia Dana M.D.   On: 02/18/2019 11:14   Ct Chest Wo Contrast  Result Date: 02/04/2019 CLINICAL DATA:  Shortness of breath EXAM: CT CHEST WITHOUT CONTRAST TECHNIQUE: Multidetector CT imaging of the chest was performed following the standard protocol without IV contrast. COMPARISON:  Plain film from previous day FINDINGS: Cardiovascular: Somewhat limited due to lack of IV contrast. Aortic calcifications are noted without significant aneurysmal dilatation. No cardiac enlargement is seen. No pericardial effusion is noted. Mediastinum/Nodes: The esophagus is within normal limits. No definitive hilar adenopathy is noted. There is a right paratracheal node which measures approximately 16 mm in short axis as well as a subcarinal node which measures 17 mm in short axis. These are likely reactive in nature. Thoracic inlet is within normal limits. Lungs/Pleura: The left lung is well aerated with minimal atelectatic changes in the lingula along the cardiac border. No sizable effusion is noted. The right hemithorax demonstrates a moderate to large pleural effusion increased from the prior exam. Underlying patchy infiltrate is noted throughout the right lung with more marked consolidation in the right lung base. No definitive nodule is noted. Upper Abdomen: Somewhat limited due to lack of IV contrast. No definitive abnormality is noted. Musculoskeletal: Mild degenerative change of the thoracic spine is noted. No acute bony abnormality is seen. IMPRESSION: Moderate to large right-sided pleural effusion with  associated consolidation most marked in the right lower lobe. Mild left basilar atelectasis. Prominent mediastinal lymph nodes likely reactive in nature. Aortic Atherosclerosis (ICD10-I70.0). Electronically Signed   By: Inez Catalina M.D.   On: 02/04/2019 15:36   Mr Brain Wo Contrast  Result Date: 02/18/2019 CLINICAL DATA:  Altered level of consciousness with change in speech. History of hypertension diabetes and lung cancer. EXAM: MRI HEAD WITHOUT CONTRAST TECHNIQUE: Multiplanar, multiecho pulse sequences of the brain and surrounding structures were obtained without  intravenous contrast. COMPARISON:  CT head 02/18/2019 FINDINGS: Brain: Motion degraded study. Rapid scanning utilized due to motion. Negative for acute infarct. Negative for mass or edema. Ventricle size normal. No hemorrhage or fluid collection. Scattered small white matter hyperintensities bilaterally. Vascular: Normal arterial flow voids Skull and upper cervical spine: Negative Sinuses/Orbits: Negative Other: None IMPRESSION: Motion degraded study No acute abnormality. Minimal white matter disease which appears chronic and unchanged from prior MRI Electronically Signed   By: Franchot Gallo M.D.   On: 02/18/2019 22:07   Mr Brain Wo Contrast  Result Date: 02/07/2019 CLINICAL DATA:  Initial evaluation for acute confusion, encephalopathy. EXAM: MRI HEAD WITHOUT CONTRAST TECHNIQUE: Multiplanar, multiecho pulse sequences of the brain and surrounding structures were obtained without intravenous contrast. COMPARISON:  None available. FINDINGS: Brain: Examination mildly degraded by motion artifact. Diffuse prominence of the CSF containing spaces compatible with generalized age-related cerebral atrophy. Mild scattered patchy T2/FLAIR hyperintensity within the periventricular deep white matter both cerebral hemispheres, most consistent with chronic microvascular ischemic disease, mild for age. No abnormal foci of restricted diffusion to suggest acute or  subacute ischemia. Gray-white matter differentiation maintained. No encephalomalacia to suggest chronic cortical infarction. No foci of susceptibility artifact to suggest acute or chronic intracranial hemorrhage. No mass lesion, midline shift or mass effect. No hydrocephalus. The extra-axial fluid collection. Pituitary gland within normal limits. Midline structures intact. Vascular: Major intracranial vascular flow voids are maintained Skull and upper cervical spine: Craniocervical junction within normal limits. Multilevel degenerative spondylolysis noted within the visualized upper cervical spine without significant stenosis. Bone marrow signal intensity normal. No scalp soft tissue abnormality. Sinuses/Orbits: Globes and orbital soft tissues within normal limits. Paranasal sinuses are clear. Left mastoid effusion noted. Inner ear structures grossly normal. Other: None. IMPRESSION: 1. No acute intracranial abnormality. 2. Mild age-related cerebral atrophy with chronic microvascular ischemic disease. 3. Left mastoid effusion, of uncertain significance. Correlation with physical exam and symptomatology for possible otomastoiditis recommended. Electronically Signed   By: Jeannine Boga M.D.   On: 02/07/2019 20:33   Dg Chest Right Decubitus  Result Date: 02/14/2019 CLINICAL DATA:  Pleural effusion. EXAM: CHEST - RIGHT DECUBITUS COMPARISON:  02/10/2019. FINDINGS: Right central line in stable position. Layering moderate sized right pleural effusion is noted. Underlying atelectatic changes are present. No pneumothorax noted. IMPRESSION: Layering moderate sized right pleural effusion. Electronically Signed   By: Marcello Moores  Register   On: 02/14/2019 06:32   Dg Chest Port 1 View  Result Date: 02/21/2019 CLINICAL DATA:  Status post right thoracentesis. EXAM: PORTABLE CHEST 1 VIEW COMPARISON:  Radiographs of February 19, 2019. FINDINGS: Stable cardiomediastinal silhouette. Atherosclerosis of thoracic aorta is noted. No  pneumothorax or significant pleural effusion is noted. Left lung is clear. Improved right basilar opacity is noted, with residual atelectasis or infiltrate. Bony thorax is unremarkable. IMPRESSION: No pneumothorax is noted. No significant pleural effusion is noted. Mild right basilar opacity is noted as described above. Electronically Signed   By: Marijo Conception M.D.   On: 02/21/2019 12:57   Dg Chest Port 1 View  Result Date: 02/19/2019 CLINICAL DATA:  Shortness of breath. EXAM: PORTABLE CHEST 1 VIEW COMPARISON:  02/18/2019 FINDINGS: Stable heart size and aortic tortuosity. Increased atelectasis of the right lower lung with probable component of moderate right pleural effusion. No overt pulmonary edema. No pneumothorax. IMPRESSION: Increased atelectasis of the right lung with probable component of moderate right pleural effusion. Electronically Signed   By: Aletta Edouard M.D.   On: 02/19/2019 12:56  Dg Chest Portable 1 View  Result Date: 02/18/2019 CLINICAL DATA:  Weakness.  Confusion. EXAM: PORTABLE CHEST 1 VIEW COMPARISON:  Weakness and confusion. FINDINGS: Interval removal of right PICC line. Heart size normal. Pulmonary venous congestion. Progressive right base atelectasis/infiltrate and right-sided pleural effusion. Left base subsegmental atelectasis again noted. No pneumothorax. IMPRESSION: 1.  Interim removal of right PICC line. 2. Progressive right base atelectasis/infiltrate and right-sided pleural effusion. Mild left base subsegmental atelectasis again noted. Electronically Signed   By: Marcello Moores  Register   On: 02/18/2019 09:54   Dg Chest Port 1 View  Result Date: 02/14/2019 CLINICAL DATA:  History of lung cancer, post right-sided thoracentesis. EXAM: PORTABLE CHEST 1 VIEW COMPARISON:  Ultrasound-guided thoracentesis-02/14/2019; chest radiograph-earlier same day; 01/11/2019; chest CT-02/04/2019 FINDINGS: Grossly unchanged enlarged cardiac silhouette and mediastinal contours with atherosclerotic  plaque within thoracic aorta. Stable position of support apparatus. Interval reduction/resolution of right-sided pleural effusion post thoracentesis. No pneumothorax. Improved aeration of the right lung base with persistent right basilar heterogeneous opacities, likely atelectasis. No new focal airspace opacities. The left hemithorax remains well aerated. No evidence of edema. No acute osseous abnormalities. IMPRESSION: Interval reduction/resolution of right-sided pleural effusion post thoracentesis. No pneumothorax. Electronically Signed   By: Sandi Mariscal M.D.   On: 02/14/2019 10:58   Dg Chest Port 1 View  Result Date: 02/08/2019 CLINICAL DATA:  Followup pleural effusion. EXAM: PORTABLE CHEST 1 VIEW COMPARISON:  02/07/2019 and earlier exams. FINDINGS: Opacity at the right lung base mostly obscures hemidiaphragm consistent with a small effusion and atelectasis. Opacity at the left lung base is less prominent than on the previous day's study, the difference likely due to differences in patient positioning only. There is hazy opacity consistent with a smaller left pleural effusion. No new lung abnormalities. No evidence of pulmonary edema. No pneumothorax. Right-sided PICC is stable. IMPRESSION: 1. No significant change from the most recent prior study allowing for differences in patient positioning. 2. Small residual right pleural effusion with associated atelectasis. Smaller left pleural effusion with atelectasis. No convincing pulmonary edema. No pneumothorax. Electronically Signed   By: Lajean Manes M.D.   On: 02/08/2019 13:25   Dg Chest Port 1 View  Result Date: 02/07/2019 CLINICAL DATA:  Status post right-sided thoracentesis. EXAM: PORTABLE CHEST 1 VIEW COMPARISON:  02/05/2019 FINDINGS: The heart is enlarged but stable. Stable tortuosity and calcification of the thoracic aorta. The right PICC line is stable. Status post right-sided thoracentesis with interval decrease in size of the right pleural  effusion. No postprocedural pneumothorax is identified. Small left pleural effusion and bibasilar atelectasis. IMPRESSION: Status post right-sided thoracentesis with near complete evacuation of the right-sided pleural effusion. No postprocedural pneumothorax. Persistent small left effusion and moderate bibasilar atelectasis. Electronically Signed   By: Marijo Sanes M.D.   On: 02/07/2019 10:33   Dg Chest Port 1 View  Result Date: 02/07/2019 CLINICAL DATA:  Shortness of breath. Patient experiencing increased shortness of breath. EXAM: PORTABLE CHEST 1 VIEW COMPARISON:  Portable chest radiograph 02/05/2019 FINDINGS: Unchanged position of a right-sided PICC with tip projecting over the upper right atrium. May consider withdrawing catheter 2.5-3 cm to place tip cavoatrial junction. The cardiomediastinal silhouette is unchanged. Interval increase in size of a right pleural effusion with increasing right basilar atelectasis. Underlying right basilar pneumonia cannot be excluded. Ill-defined opacity throughout the mid to lower right lung has also increased and may reflect incomplete atelectasis and/or developing pneumonia. Mild left basilar atelectasis is unchanged. No evidence of pneumothorax. Degenerative changes of the  spine. IMPRESSION: Unchanged position of a right PICC with tip projecting over the right atrium. Consider withdrawing catheter 2.5-3 cm to place tip at cavoatrial junction. Interval increase in size of a right pleural effusion with increasing right basilar atelectasis. Underlying right basilar pneumonia cannot be excluded. Interval increase in ill-defined opacity throughout the mid to lower right lung may reflect incomplete atelectasis and/or developing pneumonia. Electronically Signed   By: Kellie Simmering   On: 02/07/2019 08:14   Dg Chest Port 1 View  Result Date: 02/05/2019 CLINICAL DATA:  PICC line placement EXAM: PORTABLE CHEST 1 VIEW COMPARISON:  Portable exam 2225 hours compared to 1631 hours  FINDINGS: RIGHT arm PICC line with tip projecting over superior RIGHT atrium. May consider withdrawing catheter 2.5-3.0 cm to place tip at cavoatrial junction. Stable heart size and mediastinal contours. RIGHT pleural effusion and basilar atelectasis with questionable RIGHT perihilar infiltrate. Minimal LEFT base atelectasis. IMPRESSION: Tip of RIGHT arm PICC line projects over RIGHT atrium; recommend withdrawal 2.5 x 3.0 cm to place tip at approximately the cavoatrial junction. Otherwise no interval change. Electronically Signed   By: Lavonia Dana M.D.   On: 02/05/2019 22:34   Dg Chest Port 1 View  Result Date: 02/05/2019 CLINICAL DATA:  PICC line placement EXAM: PORTABLE CHEST 1 VIEW COMPARISON:  Portable exam 1613 hours compared to 02/03/2019 FINDINGS: RIGHT arm PICC line tip deflects into the azygos arch. Upper normal heart size. Mediastinal contours and pulmonary vascularity normal. Atherosclerotic calcification aorta. RIGHT lung infiltrate with small RIGHT pleural effusion and basilar atelectasis. LEFT lung clear. No pneumothorax or acute osseous findings. IMPRESSION: Tip of RIGHT arm PICC line deflects into the azygos arch, recommend repositioning into SVC. Increased RIGHT lung infiltrates, RIGHT pleural effusion and basilar atelectasis. Findings called to [facility] in ICU on 02/05/2019 at 1839 hrs. Electronically Signed   By: Lavonia Dana M.D.   On: 02/05/2019 18:40   Dg Chest Portable 1 View  Result Date: 02/03/2019 CLINICAL DATA:  Shortness of breath. EXAM: PORTABLE CHEST 1 VIEW COMPARISON:  None. FINDINGS: The heart size and pulmonary vascularity are normal. Aortic atherosclerosis. Small right pleural effusion. Focal linear atelectasis in the right midzone. No acute bone abnormality. IMPRESSION: 1. Small right pleural effusion. Slight atelectasis in the right midzone. 2. Aortic atherosclerosis. Electronically Signed   By: Lorriane Shire M.D.   On: 02/03/2019 18:36   [place] Ekg Site Rite  Result  Date: 02/05/2019 If Site Rite image not attached, placement could not be confirmed due to current cardiac rhythm.  US Thoracentesis Asp Pleural Space W/img Guide  Result Date: 02/21/2019 INDICATION: Pleural effusion. EXAM: ULTRASOUND GUIDED RIGHT THORACENTESIS MEDICATIONS: None. COMPLICATIONS: None immediate. PROCEDURE: An ultrasound guided thoracentesis was thoroughly discussed with the patient and questions answered. The benefits, risks, alternatives and complications were also discussed. The patient understands and wishes to proceed with the procedure. Written consent was obtained. Ultrasound was performed to localize and mark an adequate pocket of fluid in the right chest. The area was then prepped and draped in the normal sterile fashion. 1% Lidocaine was used for local anesthesia. Under ultrasound guidance a 6 French catheter was introduced. Thoracentesis was performed. The catheter was removed and a dressing applied. FINDINGS: A total of approximately 800 cc of bloody fluid was removed. Samples were sent to the laboratory as requested by the clinical team. IMPRESSION: Successful ultrasound guided right thoracentesis yielding 800 cc of bloody pleural fluid. 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 Please from ultrasound-guided thoracentesis for diagnostic and therapeutic purposes. EXAM: US THORACENTESIS ASP PLEURAL SPACE W/IMG GUIDE COMPARISON:  Ultrasound-guided thoracentesis-02/07/2019 yielding 1.2 L fluid. Chest radiograph-earlier same day; 02/10/2019; chest CT-02/04/2019 MEDICATIONS: None. COMPLICATIONS: None immediate. TECHNIQUE: Informed written consent was obtained from the the patient's daughter after a discussion of the risks, benefits and alternatives to treatment. A timeout was performed prior to the  initiation of the procedure. Patient was positioned left lateral decubitus in her hospital bed with initial ultrasound scanning demonstrating a recurrent moderate to large sized anechoic right-sided pleural effusion. The inferolateral aspect of the right lower chest was prepped and draped in the usual sterile fashion. 1% lidocaine was used for local anesthesia. An ultrasound image was saved for documentation purposes. An 8 Fr Safe-T-Centesis catheter was introduced. The thoracentesis was performed. The catheter was removed and a dressing was applied. The patient tolerated the procedure well without immediate post procedural complication. The patient was escorted to have an upright chest radiograph. FINDINGS: A total of approximately 1.4 liters of serous fluid was removed. Requested samples were sent to the laboratory. IMPRESSION: Successful ultrasound-guided right sided thoracentesis yielding 1.4 liters of pleural fluid. Electronically Signed   By: Sandi Mariscal M.D.   On: 02/14/2019 11:00   US Thoracentesis Asp Pleural Space W/img Guide  Result Date: 02/07/2019 INDICATION: Right pleural effusion, hypertension, diabetes EXAM: ULTRASOUND GUIDED RIGHT THORACENTESIS MEDICATIONS: 1% lidocaine local COMPLICATIONS: None immediate. PROCEDURE: An ultrasound guided thoracentesis was thoroughly discussed with the patient and questions answered. The benefits, risks, alternatives and complications were also discussed. The patient understands and wishes to proceed with the procedure. Written consent was obtained. Ultrasound was performed to localize and mark an adequate pocket of fluid in the right chest. The area was then prepped and draped in the normal sterile fashion. 1% Lidocaine was used for local anesthesia. Under ultrasound guidance a 6 Fr Safe-T-Centesis catheter was introduced. Thoracentesis was performed. The catheter was removed and a dressing applied. FINDINGS: A total of approximately 1.2 L of amber colored  pleural fluid was removed. Samples were sent to the laboratory as requested by the clinical team. IMPRESSION: Successful ultrasound guided right thoracentesis yielding 1.2 L of pleural fluid. Electronically Signed   By: Jerilynn Mages.  Shick M.D.   On: 02/07/2019 10:10    Assessment and plan-  Patient is a 78 y.o. female with newly diagnosed stage IV lung adenocarcinoma currently admitted due to altered mental status.  #Altered mental status, secondary to acute respiratory failure, underlying infection. Mental status continues to improve.  Orientated x3 today.Marland Kitchen  #New onset of atrial fibrillation with RVR Patient is on Lovenox for anticoagulation.  Cardiology following.  #Hyponatremia secondary to SIADH.  Improved. 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  Surgery has been consulted. Goal of care was discussed with patient.  She stated " I would like to see if I can stay outside of the hospital". Palliative care following.  #Bacteremia /UTI continue Rocephin.  Seen by infectious disease.  Appreciate input. #Anemia, hemoglobin trending down.  MCV trended down as well.  No reticulocyte hemoglobin.  Indicating underlying iron deficiency.  I will start patient with oral iron supplementation. Leukocytosis likely secondary to steroid use.  thank you for allowing me to participate in the care of this patient.    Earlie Server, MD, PhD  Hematology [place] at [place]- [phone] 02/22/2019

## 2019-02-22 NOTE — TOC Progression Note (Signed)
Transition of Care [facility]) - Progression Note    Patient Details  Name: Cathy Buck MRN: [id number] Date of Birth: August 12, 1940  [facility]) CM/SW Contact  Shade Flood, LCSW Phone Number: 02/22/2019, 11:45 AM  Clinical Narrative:     TOC following. Pt's status discussed in Progression today. Pt is confused today. She has pleural effusions with possible need for plurex catheter placement. Palliative is consulting. Per MD, pt may need hospice care at dc. Pt admitted to [facility] from [facility] where she was doing short term rehab. TOC will follow and assist with appropriate dc planning as level of care needs are identified.   Expected Discharge Plan: [place] Barriers to Discharge: Continued Medical Work up  Expected Discharge Plan and Services Expected Discharge Plan: [place]   Discharge Planning Services: CM Consult   Living arrangements for the past 2 months: [place]                                       Social Determinants of Health (SDOH) Interventions    Readmission Risk Interventions Readmission Risk Prevention Plan 02/19/2019  Transportation Screening Complete  PCP or Specialist Appt within 5-7 Days Complete  Home Care Screening Complete  Medication Review (RN CM) Complete  Some recent data might be hidden

## 2019-02-22 NOTE — Progress Notes (Signed)
PHARMACY CONSULT NOTE - FOLLOW UP  Pharmacy Consult for Electrolyte Monitoring and Replacement   Recent Labs: Potassium (mmol/L)  Date Value  02/22/2019 3.4 (L)   Magnesium (mg/dL)  Date Value  02/22/2019 2.2   Calcium (mg/dL)  Date Value  02/22/2019 9.4   Albumin (g/dL)  Date Value  02/18/2019 2.9 (L)   Phosphorus (mg/dL)  Date Value  02/22/2019 1.9 (L)   Sodium (mmol/L)  Date Value  02/22/2019 138   Corrected Ca: 10.28  Medications: KCL PO 40 mEq x1 dose - due at 0800 today  Assessment: -Hypophosphotemia still present but improving -K trending down   Goal of Therapy:  Replete electrolytes to normal levels  Plan:  Will order K Phos 20 mmol IV once. Follow up on AM labs.  Kristeen Miss, PharmD Clinical Pharmacist 02/22/2019 7:57 AM

## 2019-02-23 DIAGNOSIS — N390 Urinary tract infection, site not specified: Secondary | ICD-10-CM

## 2019-02-23 DIAGNOSIS — D72829 Elevated white blood cell count, unspecified: Secondary | ICD-10-CM

## 2019-02-23 DIAGNOSIS — C3491 Malignant neoplasm of unspecified part of right bronchus or lung: Secondary | ICD-10-CM

## 2019-02-23 DIAGNOSIS — B961 Klebsiella pneumoniae [K. pneumoniae] as the cause of diseases classified elsewhere: Secondary | ICD-10-CM

## 2019-02-23 DIAGNOSIS — R0689 Other abnormalities of breathing: Secondary | ICD-10-CM

## 2019-02-23 DIAGNOSIS — E871 Hypo-osmolality and hyponatremia: Secondary | ICD-10-CM

## 2019-02-23 LAB — BASIC METABOLIC PANEL
Anion gap: 14 (ref 5–15)
BUN: 22 mg/dL (ref 8–23)
CO2: 31 mmol/L (ref 22–32)
Calcium: 9 mg/dL (ref 8.9–10.3)
Chloride: 92 mmol/L — ABNORMAL LOW (ref 98–111)
Creatinine, Ser: 0.51 mg/dL (ref 0.44–1.00)
GFR calc Af Amer: >60 mL/min (ref >60)
GFR calc non Af Amer: >60 mL/min (ref >60)
Glucose, Bld: 193 mg/dL — ABNORMAL HIGH (ref 70–99)
Potassium: 4 mmol/L (ref 3.5–5.1)
Sodium: 137 mmol/L (ref 135–145)

## 2019-02-23 LAB — PHOSPHORUS: Phosphorus: 2.8 mg/dL (ref 2.5–4.6)

## 2019-02-23 LAB — MAGNESIUM: Magnesium: 2.1 mg/dL (ref 1.7–2.4)

## 2019-02-23 MED ORDER — CEFAZOLIN SODIUM-DEXTROSE 1-4 GM/50ML-% IV SOLN
1.0000 g | Freq: Three times a day (TID) | INTRAVENOUS | Status: DC
  Administered 2019-02-23 – 2019-02-25 (×5): 1 g via INTRAVENOUS
  Filled 2019-02-23 (×7): qty 50

## 2019-02-23 MED ORDER — ENOXAPARIN SODIUM 100 MG/ML ~~LOC~~ SOLN
100.0000 mg | Freq: Two times a day (BID) | SUBCUTANEOUS | Status: AC
  Administered 2019-02-23 – 2019-02-27 (×8): 100 mg via SUBCUTANEOUS
  Filled 2019-02-23 (×8): qty 1

## 2019-02-23 NOTE — TOC Progression Note (Addendum)
Transition of Care [facility]) - Progression Note    Patient Details  Name: Cathy Buck MRN: [id number] Date of Birth: 1941/06/03  Transition of Care [facility]) CM/SW Contact  Ross Ludwig, [place] Phone Number: 02/23/2019, 5:10 PM  Clinical Narrative:     Patient has bed available at [facility] once she is medically ready for discharge.  [facility] said if patient needs Pleurx Catheter, they can provide the proper care for it.  CSW to continue to follow patient's progress throughout discharge planning.   Expected Discharge Plan: [place] Barriers to Discharge: Continued Medical Work up  Expected Discharge Plan and Services Expected Discharge Plan: [place]   Discharge Planning Services: CM Consult   Living arrangements for the past 2 months: [place]                                       Social Determinants of Health (SDOH) Interventions    Readmission Risk Interventions Readmission Risk Prevention Plan 02/19/2019  Transportation Screening Complete  PCP or Specialist Appt within 5-7 Days Complete  Home Care Screening Complete  Medication Review (RN CM) Complete  Some recent data might be hidden

## 2019-02-23 NOTE — Progress Notes (Signed)
Patient ID: Cathy Buck, female   DOB: 31-Jul-1940, 78 y.o.   MRN: [id number]  Chief Complaint  Patient presents with  . Altered Mental Status    Referred By Dr. Chana Bode Reason for Referral malignant pleural effusion right side  HPI Location, Quality, Duration, Severity, Timing, Context, Modifying Factors, Associated Signs and Symptoms.  Cathy Buck is a 78 y.o. female.  I attempted to see this patient this morning.  She was somnolent although somewhat arousable.  Therefore the history is obtained from the medical record and from the nursing staff.  This patient is a 78 year old African-American female who presented with shortness of breath and respiratory insufficiency and has had several right-sided thoracenteses over the last several weeks.  She currently is a resident at [facility] and she was admitted to the hospital several days ago with similar complaints.  She has a history of SIADH and hyponatremia with mental status changes.  When I saw her this morning although arousable she was not communicative.  I was asked to see the patient for consideration of Pleurx catheter insertion.  The nursing staff state that she had been awake most of the night.  She was able to converse with the nurses but was unable to do so this morning.  The nursing staff to give me the patient's daughter's number and I will contact her later today for a more detailed history.  My review of the records shows 3 right-sided thoracentesis over the last 3 weeks.  The pathology has confirmed the presence of adenocarcinoma.   Past Medical History:  Diagnosis Date  . Diabetes mellitus without complication ([place])   . Hypertension     Past Surgical History:  Procedure Laterality Date  . ABDOMINAL HYSTERECTOMY    . CHOLECYSTECTOMY      No family history on file.  Social History Social History   Tobacco Use  . Smoking status: Former Research scientist (life sciences)  . Smokeless tobacco: Never Used  Substance Use Topics  . Alcohol  use: Not Currently  . Drug use: Not Currently    Allergies  Allergen Reactions  . Codeine Hives  . Atorvastatin Other (See Comments)  . Citalopram Other (See Comments)    Nausea  . Keflex [Cephalexin] Diarrhea  . Tylenol [Acetaminophen] Hives    Current Facility-Administered Medications  Medication Dose Route Frequency Provider Last Rate Last Dose  . 0.9 %  sodium chloride infusion   Intravenous PRN Lang Snow, NP 10 mL/hr at 02/18/19 2259 50 mL at 02/18/19 2259  . amiodarone (PACERONE) tablet 200 mg  200 mg Oral BID Tyler Pita, MD   200 mg at 02/22/19 2118  . budesonide (PULMICORT) nebulizer solution 0.5 mg  0.5 mg Nebulization BID Flora Lipps, MD   0.5 mg at 02/22/19 2021  . calcium-vitamin D (OSCAL WITH D) 500-200 MG-UNIT per tablet 1 tablet  1 tablet Oral Q breakfast Lang Snow, NP   1 tablet at 02/22/19 0850  . cefTRIAXone (ROCEPHIN) 2 g in sodium chloride 0.9 % 100 mL IVPB  2 g Intravenous Q24H Lang Snow, NP 200 mL/hr at 02/22/19 2127 2 g at 02/22/19 2127  . [START ON 02/24/2019] enoxaparin (LOVENOX) injection 100 mg  100 mg Subcutaneous BID Ojie, Jude, MD      . feeding supplement (PRO-STAT SUGAR FREE 64) liquid 30 mL  30 mL Oral Daily Lang Snow, NP   30 mL at 02/22/19 0915  . ferrous sulfate tablet 325 mg  325 mg Oral  BID WC Earlie Server, MD      . ipratropium-albuterol (DUONEB) 0.5-2.5 (3) MG/3ML nebulizer solution 3 mL  3 mL Nebulization TID Stark Jock, Jude, MD   3 mL at 02/22/19 2021  . methylPREDNISolone sodium succinate (SOLU-MEDROL) 40 mg/mL injection 40 mg  40 mg Intravenous Q12H Flora Lipps, MD   40 mg at 02/23/19 0235  . morphine 2 MG/ML injection 2 mg  2 mg Intravenous Q1H PRN Flora Lipps, MD   2 mg at 02/19/19 1623  . multivitamin with minerals tablet 1 tablet  1 tablet Oral Daily Lang Snow, NP   1 tablet at 02/22/19 0850  . pantoprazole (PROTONIX) EC tablet 40 mg  40 mg Oral Daily Lang Snow, NP   40 mg at 02/22/19 0850  . polyethylene glycol (MIRALAX / GLYCOLAX) packet 17 g  17 g Oral Daily Lang Snow, NP   17 g at 02/22/19 0850  . traZODone (DESYREL) tablet 50 mg  50 mg Oral QHS PRN Lance Coon, MD   50 mg at 02/22/19 2136  . vitamin C (ASCORBIC ACID) tablet 250 mg  250 mg Oral BID Lang Snow, NP   250 mg at 02/22/19 2118      Review of Systems A complete review of systems was asked and was negative except for the following positive findings unable to obtain  Blood pressure 130/78, pulse 84, temperature 98.2 F (36.8 C), temperature source Oral, resp. rate 18, weight 103.1 kg, SpO2 95 %.  Physical Exam CONSTITUTIONAL:  Pleasant, well-developed, well-nourished, and in no acute distress. EARS, NOSE, MOUTH AND THROAT:  The oropharynx was clear.   Oral mucosa pink and moist. LYMPH NODES:  Lymph nodes in the neck and axillae were normal RESPIRATORY:  Lungs were clear.  Normal respiratory effort without pathologic use of accessory muscles of respiration CARDIOVASCULAR: Heart was regular without murmurs.  There were no carotid bruits. GI: The abdomen was soft, nontender, and nondistended. There were no palpable masses. There was no hepatosplenomegaly. There were normal bowel sounds in all quadrants. GU:  Rectal deferred.   MUSCULOSKELETAL:  Normal muscle strength and tone.  No clubbing or cyanosis.   SKIN:  There were no pathologic skin lesions.  There were no nodules on palpation.  Data Reviewed Chest x-rays  I have personally reviewed the patient's imaging, laboratory findings and medical records.    Assessment    Malignant right-sided pleural effusion    Plan    I will attempt to contact the daughter today.  I will review with her the indications and risks of Pleurx catheter insertion.  We will need to clear with our social workers if she can be transferred back to her rehab facility with a indwelling pleural catheter.  If the  patient's family is agreeable surgery could proceed as early as next week.  It would be preferable if she could not be nursed as an inpatient until the catheter is placed.  We will need to educate the family on management of the catheter.         [name], MD 02/23/2019, 6:37 AM

## 2019-02-23 NOTE — Progress Notes (Signed)
ID Pt doing better  communicative, less confused Patient Vitals for the past 24 hrs:  BP Temp Temp src Pulse Resp SpO2 Weight  02/23/19 2006 129/67 98.9 F (37.2 C) Oral 92 16 100 % -  02/23/19 1935 - - - - - 98 % -  02/23/19 1605 138/75 98.1 F (36.7 C) Oral 77 19 97 % -  02/23/19 1346 - - - - - 96 % -  02/23/19 0831 - - - - - 96 % -  02/23/19 0752 (!) 107/50 98.6 F (37 C) Oral 71 18 95 % -  02/23/19 0500 - - - - - - 103.1 kg  02/23/19 0354 130/78 98.2 F (36.8 C) Oral 84 18 95 % -   Chest b/l air entry- decreased rt base  CBC Latest Ref Rng & Units 02/22/2019 02/20/2019 02/18/2019  WBC 4.0 - 10.5 K/uL 20.1(H) 11.2(H) 11.3(H)  Hemoglobin 12.0 - 15.0 g/dL 10.2(L) 9.8(L) 11.0(L)  Hematocrit 36.0 - 46.0 % 31.8(L) 32.1(L) 35.6(L)  Platelets 150 - 400 K/uL 201 219 253    CMP Latest Ref Rng & Units 02/23/2019 02/22/2019 02/21/2019  Glucose 70 - 99 mg/dL 193(H) 178(H) 236(H)  BUN 8 - 23 mg/dL 22 23 28(H)  Creatinine 0.44 - 1.00 mg/dL 0.51 0.50 0.43(L)  Sodium 135 - 145 mmol/L 137 138 140  Potassium 3.5 - 5.1 mmol/L 4.0 3.4(L) 3.5  Chloride 98 - 111 mmol/L 92(L) 91(L) 91(L)  CO2 22 - 32 mmol/L 31 37(H) 38(H)  Calcium 8.9 - 10.3 mg/dL 9.0 9.4 9.6  Total Protein 6.5 - 8.1 g/dL - - -  Total Bilirubin 0.3 - 1.2 mg/dL - - -  Alkaline Phos 38 - 126 U/L - - -  AST 15 - 41 U/L - - -  ALT 0 - 44 U/L - - -    BC proteus  Impression/Recommendation ?Proteus bacteremia,  is on ceftriaxone which is being changed to cefazolin tonight.. Thoracentesis cultures so far negative.  New onset leukocytosis WBC is increased to 20.  Unclear etiology.  Has been on steroids since 8 8.  A. fib on IV amiodarone.  Klebsiella in urine l.    Needs post void bladder scan  Newly diagnosed adenocarcinoma of right lung with pleural effusion .  Planning for Pleurx catheter  Metabolic encephalopathy secondary to hyponatremia as well as CO2 narcosis.  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  Normal  serum cortisol   COPD  Discussed the management with the patient and her nurse.

## 2019-02-23 NOTE — Progress Notes (Signed)
Physical Therapy Treatment and Re-evaluation Patient Details Name: Cathy Buck MRN: [id number] DOB: 07-21-1940 Today's Date: 02/23/2019    History of Present Illness 78 year old female admitted from [facility] with altered mental status, she was here last week due to shortness of breath and weakness and was noted to be severely hyponatremic.  PMH: HTN, DM II, GERD, and stage IV adenocarcinoma of the lung  followed by oncology. Evaled by PT 8/8 but transferred to CCU due to respiratory distress the same day, PT orders discontinued. Pt has undergone several thoracentises and transferred to telemetry 8/10. Tentative plan to insert pleural catheter.    PT Comments    PT re-consulted on this patient, re-evaluation performed with updated goals as well. Patient oriented to self, and place (improved from this AM). Unclear reading on pulse ox throughout, one instance of spO2 reading in 90s at rest on 3L in bed. Patient unable to move LEs without physical assist, and exhibited WOB with exertional activity. MaxAx2 for supine <> sit, pt unable to maintain seated EOB balance without at least minA, fatigued quickly. Returned to supine and repositioned totalAx2. Overall the patient demonstrated deficits (see "PT Problem List") that impede the patient's functional abilities, safety, and mobility and would benefit from skilled PT intervention. Recommendation is STR.     Follow Up Recommendations  SNF     Equipment Recommendations  Other (comment)(TBD at next venue of care)    Recommendations for Other Services       Precautions / Restrictions Precautions Precautions: Fall Restrictions Weight Bearing Restrictions: No    Mobility  Bed Mobility Overal bed mobility: Needs Assistance Bed Mobility: Supine to Sit Rolling: +2 for safety/equipment;Min assist   Supine to sit: Max assist;+2 for safety/equipment;HOB elevated Sit to supine: Max assist;+2 for physical assistance   General bed mobility  comments: Pt fatigued quickly sitting up, minA to maintain seated EOB balance  Transfers                 General transfer comment: deferred  Ambulation/Gait                 Stairs             Wheelchair Mobility    Modified Rankin (Stroke Patients Only)       Balance Overall balance assessment: Needs assistance Sitting-balance support: Feet supported;Single extremity supported;Bilateral upper extremity supported Sitting balance-Leahy Scale: Poor Sitting balance - Comments: Pt needed minA to maintain sitting balance, complained of fatigue, spO2 readings unclear                                    Cognition Arousal/Alertness: Awake/alert Behavior During Therapy: WFL for tasks assessed/performed Overall Cognitive Status: No family/caregiver present to determine baseline cognitive functioning                                 General Comments: Pt oriented to self and location, aware of her lung cancer      Exercises General Exercises - Lower Extremity Heel Slides: AAROM;Both;10 reps Hip ABduction/ADduction: AROM;Both;10 reps    General Comments        Pertinent Vitals/Pain Pain Assessment: No/denies pain    Home Living Family/patient expects to be discharged to:: Skilled nursing facility Living Arrangements: Spouse/significant other Available Help at Discharge: Family;Available 24 hours/day Type of Home: House Home Access: Stairs  to enter   Home Layout: One level Home Equipment: Shower seat;Grab bars - toilet;Walker - 4 wheels Additional Comments: Pt arrived from [facility], will very likely need to return; PLOF gathered from previous hospital stay/admission    Prior Function Level of Independence: Independent with assistive device(s)      Comments: per PT previously: Mod Ind amb with a rollator limited community distances, no fall history, Ind with ADLs.  Pt was unable to walk on that admit and went to STR    PT Goals (current goals can now be found in the care plan section) Acute Rehab PT Goals Patient Stated Goal: to get better PT Goal Formulation: With patient Time For Goal Achievement: 03/09/19 Potential to Achieve Goals: Fair    Frequency    Min 2X/week      PT Plan      Co-evaluation              AM-PAC PT "6 Clicks" Mobility   Outcome Measure  Help needed turning from your back to your side while in a flat bed without using bedrails?: Total Help needed moving from lying on your back to sitting on the side of a flat bed without using bedrails?: Total Help needed moving to and from a bed to a chair (including a wheelchair)?: Total Help needed standing up from a chair using your arms (e.g., wheelchair or bedside chair)?: Total Help needed to walk in hospital room?: Total Help needed climbing 3-5 steps with a railing? : Total 6 Click Score: 6    End of Session Equipment Utilized During Treatment: Gait belt;Oxygen(3L) Activity Tolerance: Patient limited by fatigue Patient left: with bed alarm set;with call bell/phone within reach;in bed Nurse Communication: Mobility status PT Visit Diagnosis: Muscle weakness (generalized) (M62.81);Difficulty in walking, not elsewhere classified (R26.2)     Time: 1432-1500 PT Time Calculation (min) (ACUTE ONLY): 28 min  Charges:  $Therapeutic Exercise: 8-22 mins                     Lieutenant Diego PT, DPT 4:57 PM,02/23/19 [phone]

## 2019-02-23 NOTE — Progress Notes (Signed)
PHARMACY CONSULT NOTE - FOLLOW UP  Pharmacy Consult for Electrolyte Monitoring and Replacement   Recent Labs: Potassium (mmol/L)  Date Value  02/22/2019 3.4 (L)   Magnesium (mg/dL)  Date Value  02/22/2019 2.2   Calcium (mg/dL)  Date Value  02/22/2019 9.4   Albumin (g/dL)  Date Value  02/18/2019 2.9 (L)   Phosphorus (mg/dL)  Date Value  02/22/2019 1.9 (L)   Sodium (mmol/L)  Date Value  02/22/2019 138   Corrected Ca: 10.28  Medications: N/A  Assessment: Electrolytes WNL today.  Goal of Therapy:  Replete electrolytes to normal levels  Plan:  No replacements needed for today.   Will follow electrolytes with AM labs.   Kristeen Miss, PharmD Clinical Pharmacist 02/23/2019 7:30 AM

## 2019-02-23 NOTE — Progress Notes (Signed)
Patient ID: Cathy Buck, female   DOB: 06-Apr-1941, 78 y.o.   MRN: [id number]  This NP visited patient at the bedside as a follow up for palliative medicine  needs and emotional support.  Patient remains weak and intermittently confused  Continued conversation regarding diagnosis/treatment options, prognosis, goals of care, end-of-life wishes, disposition and options with daughter [name] by phone  Family feel good about decisions made yesterday:  At this time family is looking to "alittle more time" to see how [name] will do over the next several weeks.  Plan of care: -DNR/DNI -Pleur-ex  Cath placed for symptom management   -SNF for short term rehab with  palliative care services    Future decisions will depend on how the patient progresses.   If she improves family will consider oncology treatments. If she fails to thrive, a shift to comfort will be put in place, along with hospice services.  Discussed with patient the importance of continued conversation with each other and the medical providers regarding overall plan of care and treatment options,  ensuring decisions are within the context of the patients values and GOCs.  Questions and concerns addressed    No charge  Wadie Lessen NP  Palliative Medicine Team Team Phone # [phone] Pager [phone]

## 2019-02-23 NOTE — Progress Notes (Signed)
Hematology/Oncology Progress Note [facility] [phone] [phone]  Patient Care Team: Sallee Lange, NP as PCP - General (Internal Medicine) Telford Nab, RN as Registered Nurse   Name of the patient: Cathy Buck  [id number]  03-12-1977  Date of visit: 02/23/19   INTERVAL HISTORY-  No acute overnight events.  Was seen by surgery Dr. Genevive Bi for evaluation of pleural catheter placement. Patient reports a little bit shortness of breath, otherwise no new complaints. No family members at bedside. Breathing comfortably via nasal cannula oxygen .   Review of systems- Review of Systems  Constitutional: Positive for fatigue and unexpected weight change. Negative for appetite change.  Respiratory: Positive for shortness of breath. Negative for cough.   Cardiovascular: Negative for chest pain.  Gastrointestinal: Negative for abdominal distention and abdominal pain.  Genitourinary: Negative for dysuria.   Musculoskeletal: Negative for back pain.  Skin: Negative for rash.  Neurological: Negative for light-headedness.  Hematological: Negative for adenopathy.  Psychiatric/Behavioral: Negative for confusion.    Allergies  Allergen Reactions   Codeine Hives   Atorvastatin Other (See Comments)   Citalopram Other (See Comments)    Nausea   Keflex [Cephalexin] Diarrhea   Tylenol [Acetaminophen] Hives    Patient Active Problem List   Diagnosis Date Noted   Cough    Status post thoracentesis    Palliative care by specialist    DNR (do not resuscitate)    Goals of care, counseling/discussion    Absolute anemia    Altered mental status 02/18/2019   Primary malignant neoplasm of lung metastatic to other site [facility])    Pleural effusion    Weakness generalized    Hyponatremia 02/03/2019   HTN (hypertension) 02/03/2019   Diabetes ([place]) 02/03/2019     Past Medical History:  Diagnosis Date   Diabetes mellitus  without complication ([place])    Hypertension      Past Surgical History:  Procedure Laterality Date   ABDOMINAL HYSTERECTOMY     CHOLECYSTECTOMY      Social History   Socioeconomic History   Marital status: Married    Spouse name: Not on file   Number of children: Not on file   Years of education: Not on file   Highest education level: Not on file  Occupational History   Not on file  Social Needs   Financial resource strain: Not hard at all   Food insecurity    Worry: Never true    Inability: Never true   Transportation needs    Medical: No    Non-medical: No  Tobacco Use   Smoking status: Former Smoker   Smokeless tobacco: Never Used  Substance and Sexual Activity   Alcohol use: Not Currently   Drug use: Not Currently   Sexual activity: Not Currently  Lifestyle   Physical activity    Days per week: 0 days    Minutes per session: 0 min   Stress: Not at all  Relationships   Social connections    Talks on phone: Once a week    Gets together: Once a week    Attends religious service: 1 to 4 times per year    Active member of club or organization: No    Attends meetings of clubs or organizations: Never    Relationship status: Married   Intimate partner violence    Fear of current or ex partner: No    Emotionally abused: No    Physically abused: No  Forced sexual activity: No  Other Topics Concern   Not on file  Social History Narrative   Lives at home with husband.  Uses cane, walker and rollator a times     No family history on file.   Current Facility-Administered Medications:    0.9 %  sodium chloride infusion, , Intravenous, PRN, Lang Snow, NP, Last Rate: 10 mL/hr at 02/18/19 2259, 50 mL at 02/18/19 2259   amiodarone (PACERONE) tablet 200 mg, 200 mg, Oral, BID, Tyler Pita, MD, 200 mg at 02/23/19 1016   budesonide (PULMICORT) nebulizer solution 0.5 mg, 0.5 mg, Nebulization, BID, Kasa, Kurian, MD, 0.5 mg at  02/23/19 0831   calcium-vitamin D (OSCAL WITH D) 500-200 MG-UNIT per tablet 1 tablet, 1 tablet, Oral, Q breakfast, Ouma, Bing Neighbors, NP, 1 tablet at 02/23/19 1018   cefTRIAXone (ROCEPHIN) 2 g in sodium chloride 0.9 % 100 mL IVPB, 2 g, Intravenous, Q24H, Ouma, Bing Neighbors, NP, Last Rate: 200 mL/hr at 02/22/19 2127, 2 g at 02/22/19 2127   [START ON 02/24/2019] enoxaparin (LOVENOX) injection 100 mg, 100 mg, Subcutaneous, BID, Ojie, Jude, MD   feeding supplement (PRO-STAT SUGAR FREE 64) liquid 30 mL, 30 mL, Oral, Daily, Ouma, Bing Neighbors, NP, 30 mL at 02/22/19 0915   ferrous sulfate tablet 325 mg, 325 mg, Oral, BID WC, Earlie Server, MD, 325 mg at 02/23/19 1017   ipratropium-albuterol (DUONEB) 0.5-2.5 (3) MG/3ML nebulizer solution 3 mL, 3 mL, Nebulization, TID, Ojie, Jude, MD, 3 mL at 02/23/19 0830   methylPREDNISolone sodium succinate (SOLU-MEDROL) 40 mg/mL injection 40 mg, 40 mg, Intravenous, Q12H, Kasa, Kurian, MD, 40 mg at 02/23/19 0235   morphine 2 MG/ML injection 2 mg, 2 mg, Intravenous, Q1H PRN, Flora Lipps, MD, 2 mg at 02/19/19 1623   multivitamin with minerals tablet 1 tablet, 1 tablet, Oral, Daily, Ouma, Bing Neighbors, NP, 1 tablet at 02/23/19 1017   pantoprazole (PROTONIX) EC tablet 40 mg, 40 mg, Oral, Daily, Ouma, Bing Neighbors, NP, 40 mg at 02/23/19 1017   polyethylene glycol (MIRALAX / GLYCOLAX) packet 17 g, 17 g, Oral, Daily, Ouma, Bing Neighbors, NP, 17 g at 02/22/19 0850   traZODone (DESYREL) tablet 50 mg, 50 mg, Oral, QHS PRN, Lance Coon, MD, 50 mg at 02/22/19 2136   vitamin C (ASCORBIC ACID) tablet 250 mg, 250 mg, Oral, BID, Lang Snow, NP, 250 mg at 02/23/19 1018   Physical exam:  Vitals:   02/23/19 0354 02/23/19 0500 02/23/19 0752 02/23/19 0831  BP: 130/78  (!) 107/50   Pulse: 84  71   Resp: 18  18   Temp: 98.2 F (36.8 C)  98.6 F (37 C)   TempSrc: Oral  Oral   SpO2: 95%  95% 96%  Weight:  227 lb 3.2 oz (103.1 kg)      Physical Exam  Constitutional: She is oriented to person, place, and time. No distress.  HENT:  Head: Normocephalic and atraumatic.  Mouth/Throat: No oropharyngeal exudate.  Eyes: Pupils are equal, round, and reactive to light. EOM are normal. No scleral icterus.  Neck: Normal range of motion. Neck supple.  Cardiovascular: Normal rate and regular rhythm.  No murmur heard. Pulmonary/Chest: Effort normal. No respiratory distress. She has no wheezes.  Decreased breath sound bilaterally. Breathing via nasal cannula oxygen.  Abdominal: Soft. Bowel sounds are normal. She exhibits no distension. There is no abdominal tenderness.  Musculoskeletal: Normal range of motion.        General: No edema.  Neurological: She  is alert and oriented to person, place, and time.  Orientated to herself and place.  Skin: Skin is warm and dry. She is not diaphoretic. No erythema.  Psychiatric: Affect normal.       CMP Latest Ref Rng & Units 02/23/2019  Glucose 70 - 99 mg/dL 193(H)  BUN 8 - 23 mg/dL 22  Creatinine 0.44 - 1.00 mg/dL 0.51  Sodium 135 - 145 mmol/L 137  Potassium 3.5 - 5.1 mmol/L 4.0  Chloride 98 - 111 mmol/L 92(L)  CO2 22 - 32 mmol/L 31  Calcium 8.9 - 10.3 mg/dL 9.0  Total Protein 6.5 - 8.1 g/dL -  Total Bilirubin 0.3 - 1.2 mg/dL -  Alkaline Phos 38 - 126 U/L -  AST 15 - 41 U/L -  ALT 0 - 44 U/L -   CBC Latest Ref Rng & Units 02/22/2019  WBC 4.0 - 10.5 K/uL 20.1(H)  Hemoglobin 12.0 - 15.0 g/dL 10.2(L)  Hematocrit 36.0 - 46.0 % 31.8(L)  Platelets 150 - 400 K/uL 201   RADIOGRAPHIC STUDIES: I have personally reviewed the radiological images as listed and agreed with the findings in the report.  Dg Chest 2 View  Result Date: 02/10/2019 CLINICAL DATA:  Pleural effusion EXAM: CHEST - 2 VIEW COMPARISON:  Two days ago FINDINGS: Right PICC with tip at the right atrium. Haziness in the lower right chest. Hyperinflation with diaphragm flattening. Thickened right hilum. There was adenopathy  on preceding chest CT. Normal heart size. IMPRESSION: Unchanged right pleural effusion and lower lobe opacification. Electronically Signed   By: Monte Fantasia M.D.   On: 02/10/2019 10:39   Ct Head Wo Contrast  Result Date: 02/18/2019 CLINICAL DATA:  Altered level of consciousness, unexplained, lethargy, hypotension, diabetes mellitus, hypertension, lung cancer EXAM: CT HEAD WITHOUT CONTRAST TECHNIQUE: Contiguous axial images were obtained from the base of the skull through the vertex without intravenous contrast. Sagittal and coronal MPR images reconstructed from axial data set. COMPARISON:  MR brain 02/07/2019 FINDINGS: Brain: Mild generalized atrophy. Normal ventricular morphology. No midline shift or mass effect.2 dense calcification of falx. No intracranial hemorrhage, mass lesion or evidence of acute infarction. No extra-axial fluid collections. Vascular: No hyperdense vessels Skull: Intact Sinuses/Orbits: Clear Other: N/A IMPRESSION: No acute intracranial abnormalities. Electronically Signed   By: Lavonia Dana M.D.   On: 02/18/2019 11:14   Ct Chest Wo Contrast  Result Date: 02/04/2019 CLINICAL DATA:  Shortness of breath EXAM: CT CHEST WITHOUT CONTRAST TECHNIQUE: Multidetector CT imaging of the chest was performed following the standard protocol without IV contrast. COMPARISON:  Plain film from previous day FINDINGS: Cardiovascular: Somewhat limited due to lack of IV contrast. Aortic calcifications are noted without significant aneurysmal dilatation. No cardiac enlargement is seen. No pericardial effusion is noted. Mediastinum/Nodes: The esophagus is within normal limits. No definitive hilar adenopathy is noted. There is a right paratracheal node which measures approximately 16 mm in short axis as well as a subcarinal node which measures 17 mm in short axis. These are likely reactive in nature. Thoracic inlet is within normal limits. Lungs/Pleura: The left lung is well aerated with minimal atelectatic  changes in the lingula along the cardiac border. No sizable effusion is noted. The right hemithorax demonstrates a moderate to large pleural effusion increased from the prior exam. Underlying patchy infiltrate is noted throughout the right lung with more marked consolidation in the right lung base. No definitive nodule is noted. Upper Abdomen: Somewhat limited due to lack of IV contrast. No definitive abnormality  is noted. Musculoskeletal: Mild degenerative change of the thoracic spine is noted. No acute bony abnormality is seen. IMPRESSION: Moderate to large right-sided pleural effusion with associated consolidation most marked in the right lower lobe. Mild left basilar atelectasis. Prominent mediastinal lymph nodes likely reactive in nature. Aortic Atherosclerosis (ICD10-I70.0). Electronically Signed   By: Inez Catalina M.D.   On: 02/04/2019 15:36   Mr Brain Wo Contrast  Result Date: 02/18/2019 CLINICAL DATA:  Altered level of consciousness with change in speech. History of hypertension diabetes and lung cancer. EXAM: MRI HEAD WITHOUT CONTRAST TECHNIQUE: Multiplanar, multiecho pulse sequences of the brain and surrounding structures were obtained without intravenous contrast. COMPARISON:  CT head 02/18/2019 FINDINGS: Brain: Motion degraded study. Rapid scanning utilized due to motion. Negative for acute infarct. Negative for mass or edema. Ventricle size normal. No hemorrhage or fluid collection. Scattered small white matter hyperintensities bilaterally. Vascular: Normal arterial flow voids Skull and upper cervical spine: Negative Sinuses/Orbits: Negative Other: None IMPRESSION: Motion degraded study No acute abnormality. Minimal white matter disease which appears chronic and unchanged from prior MRI Electronically Signed   By: Franchot Gallo M.D.   On: 02/18/2019 22:07   Mr Brain Wo Contrast  Result Date: 02/07/2019 CLINICAL DATA:  Initial evaluation for acute confusion, encephalopathy. EXAM: MRI HEAD  WITHOUT CONTRAST TECHNIQUE: Multiplanar, multiecho pulse sequences of the brain and surrounding structures were obtained without intravenous contrast. COMPARISON:  None available. FINDINGS: Brain: Examination mildly degraded by motion artifact. Diffuse prominence of the CSF containing spaces compatible with generalized age-related cerebral atrophy. Mild scattered patchy T2/FLAIR hyperintensity within the periventricular deep white matter both cerebral hemispheres, most consistent with chronic microvascular ischemic disease, mild for age. No abnormal foci of restricted diffusion to suggest acute or subacute ischemia. Gray-white matter differentiation maintained. No encephalomalacia to suggest chronic cortical infarction. No foci of susceptibility artifact to suggest acute or chronic intracranial hemorrhage. No mass lesion, midline shift or mass effect. No hydrocephalus. The extra-axial fluid collection. Pituitary gland within normal limits. Midline structures intact. Vascular: Major intracranial vascular flow voids are maintained Skull and upper cervical spine: Craniocervical junction within normal limits. Multilevel degenerative spondylolysis noted within the visualized upper cervical spine without significant stenosis. Bone marrow signal intensity normal. No scalp soft tissue abnormality. Sinuses/Orbits: Globes and orbital soft tissues within normal limits. Paranasal sinuses are clear. Left mastoid effusion noted. Inner ear structures grossly normal. Other: None. IMPRESSION: 1. No acute intracranial abnormality. 2. Mild age-related cerebral atrophy with chronic microvascular ischemic disease. 3. Left mastoid effusion, of uncertain significance. Correlation with physical exam and symptomatology for possible otomastoiditis recommended. Electronically Signed   By: Jeannine Boga M.D.   On: 02/07/2019 20:33   Dg Chest Right Decubitus  Result Date: 02/14/2019 CLINICAL DATA:  Pleural effusion. EXAM: CHEST -  RIGHT DECUBITUS COMPARISON:  02/10/2019. FINDINGS: Right central line in stable position. Layering moderate sized right pleural effusion is noted. Underlying atelectatic changes are present. No pneumothorax noted. IMPRESSION: Layering moderate sized right pleural effusion. Electronically Signed   By: Marcello Moores  Register   On: 02/14/2019 06:32   Dg Chest Port 1 View  Result Date: 02/21/2019 CLINICAL DATA:  Status post right thoracentesis. EXAM: PORTABLE CHEST 1 VIEW COMPARISON:  Radiographs of February 19, 2019. FINDINGS: Stable cardiomediastinal silhouette. Atherosclerosis of thoracic aorta is noted. No pneumothorax or significant pleural effusion is noted. Left lung is clear. Improved right basilar opacity is noted, with residual atelectasis or infiltrate. Bony thorax is unremarkable. IMPRESSION: No pneumothorax is noted. No significant  pleural effusion is noted. Mild right basilar opacity is noted as described above. Electronically Signed   By: Marijo Conception M.D.   On: 02/21/2019 12:57   Dg Chest Port 1 View  Result Date: 02/19/2019 CLINICAL DATA:  Shortness of breath. EXAM: PORTABLE CHEST 1 VIEW COMPARISON:  02/18/2019 FINDINGS: Stable heart size and aortic tortuosity. Increased atelectasis of the right lower lung with probable component of moderate right pleural effusion. No overt pulmonary edema. No pneumothorax. IMPRESSION: Increased atelectasis of the right lung with probable component of moderate right pleural effusion. Electronically Signed   By: Aletta Edouard M.D.   On: 02/19/2019 12:56   Dg Chest Portable 1 View  Result Date: 02/18/2019 CLINICAL DATA:  Weakness.  Confusion. EXAM: PORTABLE CHEST 1 VIEW COMPARISON:  Weakness and confusion. FINDINGS: Interval removal of right PICC line. Heart size normal. Pulmonary venous congestion. Progressive right base atelectasis/infiltrate and right-sided pleural effusion. Left base subsegmental atelectasis again noted. No pneumothorax. IMPRESSION: 1.  Interim  removal of right PICC line. 2. Progressive right base atelectasis/infiltrate and right-sided pleural effusion. Mild left base subsegmental atelectasis again noted. Electronically Signed   By: Marcello Moores  Register   On: 02/18/2019 09:54   Dg Chest Port 1 View  Result Date: 02/14/2019 CLINICAL DATA:  History of lung cancer, post right-sided thoracentesis. EXAM: PORTABLE CHEST 1 VIEW COMPARISON:  Ultrasound-guided thoracentesis-02/14/2019; chest radiograph-earlier same day; 01/11/2019; chest CT-02/04/2019 FINDINGS: Grossly unchanged enlarged cardiac silhouette and mediastinal contours with atherosclerotic plaque within thoracic aorta. Stable position of support apparatus. Interval reduction/resolution of right-sided pleural effusion post thoracentesis. No pneumothorax. Improved aeration of the right lung base with persistent right basilar heterogeneous opacities, likely atelectasis. No new focal airspace opacities. The left hemithorax remains well aerated. No evidence of edema. No acute osseous abnormalities. IMPRESSION: Interval reduction/resolution of right-sided pleural effusion post thoracentesis. No pneumothorax. Electronically Signed   By: Sandi Mariscal M.D.   On: 02/14/2019 10:58   Dg Chest Port 1 View  Result Date: 02/08/2019 CLINICAL DATA:  Followup pleural effusion. EXAM: PORTABLE CHEST 1 VIEW COMPARISON:  02/07/2019 and earlier exams. FINDINGS: Opacity at the right lung base mostly obscures hemidiaphragm consistent with a small effusion and atelectasis. Opacity at the left lung base is less prominent than on the previous day's study, the difference likely due to differences in patient positioning only. There is hazy opacity consistent with a smaller left pleural effusion. No new lung abnormalities. No evidence of pulmonary edema. No pneumothorax. Right-sided PICC is stable. IMPRESSION: 1. No significant change from the most recent prior study allowing for differences in patient positioning. 2. Small  residual right pleural effusion with associated atelectasis. Smaller left pleural effusion with atelectasis. No convincing pulmonary edema. No pneumothorax. Electronically Signed   By: Lajean Manes M.D.   On: 02/08/2019 13:25   Dg Chest Port 1 View  Result Date: 02/07/2019 CLINICAL DATA:  Status post right-sided thoracentesis. EXAM: PORTABLE CHEST 1 VIEW COMPARISON:  02/05/2019 FINDINGS: The heart is enlarged but stable. Stable tortuosity and calcification of the thoracic aorta. The right PICC line is stable. Status post right-sided thoracentesis with interval decrease in size of the right pleural effusion. No postprocedural pneumothorax is identified. Small left pleural effusion and bibasilar atelectasis. IMPRESSION: Status post right-sided thoracentesis with near complete evacuation of the right-sided pleural effusion. No postprocedural pneumothorax. Persistent small left effusion and moderate bibasilar atelectasis. Electronically Signed   By: Marijo Sanes M.D.   On: 02/07/2019 10:33   Dg Chest [facility]  Result Date: 02/07/2019 CLINICAL DATA:  Shortness of breath. Patient experiencing increased shortness of breath. EXAM: PORTABLE CHEST 1 VIEW COMPARISON:  Portable chest radiograph 02/05/2019 FINDINGS: Unchanged position of a right-sided PICC with tip projecting over the upper right atrium. May consider withdrawing catheter 2.5-3 cm to place tip cavoatrial junction. The cardiomediastinal silhouette is unchanged. Interval increase in size of a right pleural effusion with increasing right basilar atelectasis. Underlying right basilar pneumonia cannot be excluded. Ill-defined opacity throughout the mid to lower right lung has also increased and may reflect incomplete atelectasis and/or developing pneumonia. Mild left basilar atelectasis is unchanged. No evidence of pneumothorax. Degenerative changes of the spine. IMPRESSION: Unchanged position of a right PICC with tip projecting over the right atrium.  Consider withdrawing catheter 2.5-3 cm to place tip at cavoatrial junction. Interval increase in size of a right pleural effusion with increasing right basilar atelectasis. Underlying right basilar pneumonia cannot be excluded. Interval increase in ill-defined opacity throughout the mid to lower right lung may reflect incomplete atelectasis and/or developing pneumonia. Electronically Signed   By: Kellie Simmering   On: 02/07/2019 08:14   Dg Chest Port 1 View  Result Date: 02/05/2019 CLINICAL DATA:  PICC line placement EXAM: PORTABLE CHEST 1 VIEW COMPARISON:  Portable exam 2225 hours compared to 1631 hours FINDINGS: RIGHT arm PICC line with tip projecting over superior RIGHT atrium. May consider withdrawing catheter 2.5-3.0 cm to place tip at cavoatrial junction. Stable heart size and mediastinal contours. RIGHT pleural effusion and basilar atelectasis with questionable RIGHT perihilar infiltrate. Minimal LEFT base atelectasis. IMPRESSION: Tip of RIGHT arm PICC line projects over RIGHT atrium; recommend withdrawal 2.5 x 3.0 cm to place tip at approximately the cavoatrial junction. Otherwise no interval change. Electronically Signed   By: Lavonia Dana M.D.   On: 02/05/2019 22:34   Dg Chest Port 1 View  Result Date: 02/05/2019 CLINICAL DATA:  PICC line placement EXAM: PORTABLE CHEST 1 VIEW COMPARISON:  Portable exam 1613 hours compared to 02/03/2019 FINDINGS: RIGHT arm PICC line tip deflects into the azygos arch. Upper normal heart size. Mediastinal contours and pulmonary vascularity normal. Atherosclerotic calcification aorta. RIGHT lung infiltrate with small RIGHT pleural effusion and basilar atelectasis. LEFT lung clear. No pneumothorax or acute osseous findings. IMPRESSION: Tip of RIGHT arm PICC line deflects into the azygos arch, recommend repositioning into SVC. Increased RIGHT lung infiltrates, RIGHT pleural effusion and basilar atelectasis. Findings called to [facility] in ICU on 02/05/2019 at 1839 hrs.  Electronically Signed   By: Lavonia Dana M.D.   On: 02/05/2019 18:40   Dg Chest Portable 1 View  Result Date: 02/03/2019 CLINICAL DATA:  Shortness of breath. EXAM: PORTABLE CHEST 1 VIEW COMPARISON:  None. FINDINGS: The heart size and pulmonary vascularity are normal. Aortic atherosclerosis. Small right pleural effusion. Focal linear atelectasis in the right midzone. No acute bone abnormality. IMPRESSION: 1. Small right pleural effusion. Slight atelectasis in the right midzone. 2. Aortic atherosclerosis. Electronically Signed   By: Lorriane Shire M.D.   On: 02/03/2019 18:36   [place] Ekg Site Rite  Result Date: 02/05/2019 If Site Rite image not attached, placement could not be confirmed due to current cardiac rhythm.  US Thoracentesis Asp Pleural Space W/img Guide  Result Date: 02/21/2019 INDICATION: Pleural effusion. EXAM: ULTRASOUND GUIDED RIGHT THORACENTESIS MEDICATIONS: None. COMPLICATIONS: None immediate. PROCEDURE: An ultrasound guided thoracentesis was thoroughly discussed with the patient and questions answered. The benefits, risks, alternatives and complications were also discussed. The patient understands and wishes to  proceed with the procedure. Written consent was obtained. Ultrasound was performed to localize and mark an adequate pocket of fluid in the right chest. The area was then prepped and draped in the normal sterile fashion. 1% Lidocaine was used for local anesthesia. Under ultrasound guidance a 6 French catheter was introduced. Thoracentesis was performed. The catheter was removed and a dressing applied. FINDINGS: A total of approximately 800 cc of bloody fluid was removed. Samples were sent to the laboratory as requested by the clinical team. IMPRESSION: Successful ultrasound guided right thoracentesis yielding 800 cc of bloody pleural fluid. 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 Please from ultrasound-guided thoracentesis for diagnostic and therapeutic purposes. EXAM: US THORACENTESIS ASP PLEURAL SPACE W/IMG GUIDE COMPARISON:  Ultrasound-guided thoracentesis-02/07/2019 yielding 1.2 L fluid. Chest radiograph-earlier same day; 02/10/2019; chest CT-02/04/2019 MEDICATIONS: None. COMPLICATIONS: None immediate. TECHNIQUE: Informed written consent was obtained from the the patient's daughter after a discussion of the risks, benefits and alternatives to treatment. A timeout was performed prior to the initiation of the procedure. Patient was positioned left lateral decubitus in her hospital bed with initial ultrasound scanning demonstrating a recurrent moderate to large sized anechoic right-sided pleural effusion. The inferolateral aspect of the right lower chest was prepped and draped in the usual sterile fashion. 1% lidocaine was used for local anesthesia. An ultrasound image was saved for documentation purposes. An 8 Fr Safe-T-Centesis catheter was introduced. The thoracentesis was performed. The catheter was removed and a dressing was applied. The patient tolerated the procedure well without immediate post procedural complication. The patient was escorted to have an upright chest radiograph. FINDINGS: A total of approximately 1.4 liters of serous fluid was removed. Requested samples were sent to the laboratory. IMPRESSION: Successful ultrasound-guided right sided thoracentesis yielding 1.4 liters of pleural fluid. Electronically Signed   By: Sandi Mariscal M.D.   On: 02/14/2019 11:00   US Thoracentesis Asp Pleural Space W/img Guide  Result Date: 02/07/2019 INDICATION: Right pleural effusion, hypertension, diabetes EXAM: ULTRASOUND GUIDED RIGHT THORACENTESIS MEDICATIONS: 1% lidocaine local COMPLICATIONS: None immediate. PROCEDURE: An ultrasound guided thoracentesis was thoroughly discussed  with the patient and questions answered. The benefits, risks, alternatives and complications were also discussed. The patient understands and wishes to proceed with the procedure. Written consent was obtained. Ultrasound was performed to localize and mark an adequate pocket of fluid in the right chest. The area was then prepped and draped in the normal sterile fashion. 1% Lidocaine was used for local anesthesia. Under ultrasound guidance a 6 Fr Safe-T-Centesis catheter was introduced. Thoracentesis was performed. The catheter was removed and a dressing applied. FINDINGS: A total of approximately 1.2 L of amber colored pleural fluid was removed. Samples were sent to the laboratory as requested by the clinical team. IMPRESSION: Successful ultrasound guided right thoracentesis yielding 1.2 L of pleural fluid. Electronically Signed   By: Jerilynn Mages.  Shick M.D.   On: 02/07/2019 10:10    Assessment and plan-  Patient is a 78 y.o. female with newly diagnosed stage IV lung adenocarcinoma currently admitted due to altered mental status.  #Altered mental status, secondary to acute respiratory failure, underlying infection. Mental status improved and close to baseline.   #New onset of atrial fibrillation with RVR on amiodarone. I agree with therapeutic Lovenox for anticoagulation.  Cardiology following.  #Hyponatremia secondary to SIADH.  Improved. 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  Surgery has been consulted. Await to see if the facility patient needs to return to is able to care catheter or not  #Bacteremia /UTI continue Rocephin.  Seen by infectious disease.  On ceftriaxone. #Anemia, hemoglobin trending down.  MCV trended down as well.  No reticulocyte hemoglobin.  Indicating underlying iron deficiency.  Continue oral iron supplementation.  Bowel regimen PRN. Leukocytosis likely secondary to steroid use. OT PT evaluation  thank you for  allowing me to participate in the care of this patient.    Earlie Server, MD, PhD Hematology Oncology [facility] Pager- [phone] 02/23/2019

## 2019-02-23 NOTE — Progress Notes (Signed)
[place] [facility] [place] NAME: Cathy Buck    MR#:  [id number]  DATE OF BIRTH:  [birth year]  SUBJECTIVE:  CHIEF COMPLAINT:   Chief Complaint  Patient presents with  . Altered Mental Status   Patient denies any symptoms. REVIEW OF SYSTEMS:  Review of Systems  Constitutional: Negative for chills and fever.  HENT: Negative for hearing loss and tinnitus.   Eyes: Negative for blurred vision and double vision.  Respiratory: Negative for cough and shortness of breath.   Cardiovascular: Negative for chest pain and palpitations.  Gastrointestinal: Negative for abdominal pain, heartburn, nausea and vomiting.  Genitourinary: Negative for dysuria and urgency.  Musculoskeletal: Negative for myalgias.  Skin: Negative for itching and rash.  Neurological: Negative for dizziness and headaches.  Psychiatric/Behavioral: Negative for depression and substance abuse.    DRUG ALLERGIES:   Allergies  Allergen Reactions  . Codeine Hives  . Atorvastatin Other (See Comments)  . Citalopram Other (See Comments)    Nausea  . Keflex [Cephalexin] Diarrhea  . Tylenol [Acetaminophen] Hives   VITALS:  Blood pressure (!) 107/50, pulse 71, temperature 98.6 F (37 C), temperature source Oral, resp. rate 18, weight 103.1 kg, SpO2 96 %. PHYSICAL EXAMINATION:  Physical Exam  GENERAL:  78 y.o.-year-old patient lying in the bed with no acute distress.  EYES: Pupils equal, round, reactive to light and accommodation. No scleral icterus. Extraocular muscles intact.  HEENT: Head atraumatic, normocephalic. Oropharynx and nasopharynx clear.  NECK:  Supple, no jugular venous distention. No thyroid enlargement, no tenderness.  LUNGS: Moderately diminished breath sounds bilaterally, no wheezing, rales,rhonchi or crepitation. No use of accessory muscles of respiration.  CARDIOVASCULAR: S1, S2 normal. No murmurs, rubs, or gallops.  ABDOMEN: Soft, nontender, nondistended. Bowel  sounds present.  EXTREMITIES: No pedal edema, cyanosis, or clubbing.  NEUROLOGIC: Patient awake and alert and oriented this morning.  Gait not checked for safety PSYCHIATRIC: Patient awake and alert and oriented this morning x3 SKIN: No obvious rash, lesion, or ulcer.   LABORATORY PANEL:  Female CBC Recent Labs  Lab 02/22/19 0501  WBC 20.1*  HGB 10.2*  HCT 31.8*  PLT 201   ------------------------------------------------------------------------------------------------------------------ Chemistries  Recent Labs  Lab 02/18/19 0941  02/23/19 0556  NA 129*   < > 137  K 5.2*   < > 4.0  CL 83*   < > 92*  CO2 34*   < > 31  GLUCOSE 152*   < > 193*  BUN 48*   < > 22  CREATININE 0.94   < > 0.51  CALCIUM 9.7   < > 9.0  MG  --    < > 2.1  AST 37  --   --   ALT 49*  --   --   ALKPHOS 81  --   --   BILITOT 0.8  --   --    < > = values in this interval not displayed.   RADIOLOGY:  No results found. ASSESSMENT AND PLAN:   #Acute metabolic encephalopathy from acute hypoxemic and hypercapnic respiratory failure and , underlying UTI Patient already weaned off BiPAP. Also had a UTI CT head negative abnormalities Patient transferred out of ICU  #New onset A. fib with RVR-initially with low BP now hemodynamically stable Rate controlled.  TSH level normal. -RecentTTEcho7/2020 with LVEF 60-65% Continue Lovenox.  Follow-up cardiology input  #.Hyponatremia-likely secondary to SIADH and adenocarcinoma of the pleural space -Nephrology consult placed and discussed with Dr.  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  Oncology Dr. Mike Gip has seen the patient -Patient was on concurrent chemotherapy and immunotherapy prior to this admission -Plan PET scan as outpatient once stable Patient seen by cardiothoracic surgery possible Pleurx catheter placement   #Acute cystitis urine culture greater than 100,000 colonies of Klebsiella ornithinolytica sensitive to Rocephin.  Continue Rocephin  #.  Proteus mirabilis bacteremia Also sensitive to ceftriaxone.  #.Type 2 diabetes mellitus -sliding scale  #.Thromboembolism Risk Management -Lovenoxper pharmacy protocolpending cardiology evaluation  DVT prophylaxis; patient already on Lovenox  All the records are reviewed and case discussed with Care Management/Social Worker. Management plans discussed with the patient, family and they are in agreement.   CODE STATUS: DNR  TOTAL TIME TAKING CARE OF THIS PATIENT: 36 minutes.   More than 50% of the time was spent in counseling/coordination of care: YES  POSSIBLE D/C IN 3 DAYS, DEPENDING ON CLINICAL CONDITION.   Dustin Flock M.D on 02/23/2019 at 2:27 PM  Between 7am to 6pm - Pager - [phone]  After 6pm go to [email or web address] - Proofreader  [facility]  Office  [phone]  CC: Primary care physician; Sallee Lange, NP  Note: This dictation was prepared with Dragon dictation along with smaller phrase technology. Any transcriptional errors that result from this process are unintentional.

## 2019-02-23 NOTE — Progress Notes (Signed)
PT Cancellation Note  Patient Details Name: Cathy Buck MRN: [id number] DOB: 09-04-1940   Cancelled Treatment:    Reason Eval/Treat Not Completed: Other (comment)(PT in room, pt able to follow simple commands oriented to self, minA to roll for patient care. During talking to PT, pt expelled sputum with blood, RN notifed, and alerted physician. PT to follow and attempt as able with medical/MD clearance.)   Lieutenant Diego PT, DPT 10:20 AM,02/23/19 [phone]

## 2019-02-23 NOTE — Consult Note (Addendum)
[place] for Enoxaparin Indication: atrial fibrillation  Allergies  Allergen Reactions  . Codeine Hives  . Atorvastatin Other (See Comments)  . Citalopram Other (See Comments)    Nausea  . Keflex [Cephalexin] Diarrhea  . Tylenol [Acetaminophen] Hives    Patient Measurements: Weight: 227 lb 3.2 oz (103.1 kg)  Vital Signs: Temp: 98.6 F (37 C) (08/12 0752) Temp Source: Oral (08/12 0752) BP: 107/50 (08/12 0752) Pulse Rate: 71 (08/12 0752)  Labs: Recent Labs    02/21/19 0539 02/22/19 0501 02/23/19 0556  HGB  --  10.2*  --   HCT  --  31.8*  --   PLT  --  201  --   CREATININE 0.43* 0.50 0.51    Estimated Creatinine Clearance: 67.8 mL/min (by C-G formula based on SCr of 0.51 mg/dL).   Medical History: Past Medical History:  Diagnosis Date  . Diabetes mellitus without complication ([place])   . Hypertension     Medications:  Medications Prior to Admission  Medication Sig Dispense Refill Last Dose  . albuterol (VENTOLIN HFA) 108 (90 Base) MCG/ACT inhaler Inhale 1 puff into the lungs every 6 (six) hours as needed for wheezing or shortness of breath.   prn at prn  . Amino Acids-Protein Hydrolys (FEEDING SUPPLEMENT, PRO-STAT SUGAR FREE 64,) LIQD Take 30 mLs by mouth daily.   02/17/2019 at 0900  . atenolol (TENORMIN) 50 MG tablet Take 50 mg by mouth daily.   02/17/2019 at 0900  . Calcium Carb-Cholecalciferol (CALCIUM-VITAMIN D) 500-200 MG-UNIT tablet Take 1 tablet by mouth 2 (two) times a day.   02/17/2019 at 1800  . Multiple Vitamin (MULTIVITAMIN WITH MINERALS) TABS tablet Take 1 tablet by mouth daily.   02/17/2019 at 0900  . omeprazole (PRILOSEC) 40 MG capsule Take 40 mg by mouth daily. 30 minutes before food and meds   02/17/2019 at 0600  . polyethylene glycol (MIRALAX / GLYCOLAX) 17 g packet Take 17 g by mouth daily. 30 each 0 02/17/2019 at 0900  . vitamin C (ASCORBIC ACID) 250 MG tablet Take 250 mg by mouth 2 (two) times daily.   02/17/2019 at  Unknown time   Scheduled:  . amiodarone  200 mg Oral BID  . budesonide (PULMICORT) nebulizer solution  0.5 mg Nebulization BID  . calcium-vitamin D  1 tablet Oral Q breakfast  . [START ON 02/24/2019] enoxaparin (LOVENOX) injection  100 mg Subcutaneous BID  . feeding supplement (PRO-STAT SUGAR FREE 64)  30 mL Oral Daily  . ferrous sulfate  325 mg Oral BID WC  . ipratropium-albuterol  3 mL Nebulization TID  . methylPREDNISolone (SOLU-MEDROL) injection  40 mg Intravenous Q12H  . multivitamin with minerals  1 tablet Oral Daily  . pantoprazole  40 mg Oral Daily  . polyethylene glycol  17 g Oral Daily  . vitamin C  250 mg Oral BID   Infusions:  . sodium chloride 50 mL (02/18/19 2259)  . cefTRIAXone (ROCEPHIN)  IV 2 g (02/22/19 2127)   PRN: sodium chloride, morphine injection, traZODone Anti-infectives (From admission, onward)   Start     Dose/Rate Route Frequency Ordered Stop   02/18/19 2130  cefTRIAXone (ROCEPHIN) 2 g in sodium chloride 0.9 % 100 mL IVPB     2 g 200 mL/hr over 30 Minutes Intravenous Every 24 hours 02/18/19 2117        Assessment: Pharmacy consulted to start enoxaparin for afib. No DOAC PTA. Cardiology consult pending. Patient will not have surgery until  next week. Will continue treatment dose of enoxaparin until pleurax catheter insertion until next week.    Goal of Therapy:  Monitor platelets by anticoagulation protocol: Yes   Plan:  Lovenox 100 mg q12H. Per Dr. Posey Pronto, it is okay to resume enoxaparin.   Will follow serum creatinine and CBC every three days.   Rowland Lathe, PharmD 02/23/2019,1:26 PM

## 2019-02-24 LAB — CBC
HCT: 32 % — ABNORMAL LOW (ref 36.0–46.0)
Hemoglobin: 10.2 g/dL — ABNORMAL LOW (ref 12.0–15.0)
MCH: 24.4 pg — ABNORMAL LOW (ref 26.0–34.0)
MCHC: 31.9 g/dL (ref 30.0–36.0)
MCV: 76.6 fL — ABNORMAL LOW (ref 80.0–100.0)
Platelets: 254 10*3/uL (ref 150–400)
RBC: 4.18 MIL/uL (ref 3.87–5.11)
RDW: 16 % — ABNORMAL HIGH (ref 11.5–15.5)
WBC: 27 10*3/uL — ABNORMAL HIGH (ref 4.0–10.5)
nRBC: 0.1 % (ref 0.0–0.2)

## 2019-02-24 LAB — BASIC METABOLIC PANEL
Anion gap: 8 (ref 5–15)
BUN: 23 mg/dL (ref 8–23)
CO2: 34 mmol/L — ABNORMAL HIGH (ref 22–32)
Calcium: 9 mg/dL (ref 8.9–10.3)
Chloride: 92 mmol/L — ABNORMAL LOW (ref 98–111)
Creatinine, Ser: 0.54 mg/dL (ref 0.44–1.00)
GFR calc Af Amer: >60 mL/min (ref >60)
GFR calc non Af Amer: >60 mL/min (ref >60)
Glucose, Bld: 171 mg/dL — ABNORMAL HIGH (ref 70–99)
Potassium: 3.8 mmol/L (ref 3.5–5.1)
Sodium: 134 mmol/L — ABNORMAL LOW (ref 135–145)

## 2019-02-24 LAB — BODY FLUID CULTURE: Culture: NO GROWTH

## 2019-02-24 LAB — MAGNESIUM: Magnesium: 2.2 mg/dL (ref 1.7–2.4)

## 2019-02-24 MED ORDER — PREDNISONE 20 MG PO TABS
40.0000 mg | ORAL_TABLET | Freq: Every day | ORAL | Status: AC
  Administered 2019-02-26: 40 mg via ORAL
  Filled 2019-02-24: qty 2

## 2019-02-24 MED ORDER — PREDNISONE 50 MG PO TABS
50.0000 mg | ORAL_TABLET | Freq: Every day | ORAL | Status: AC
  Administered 2019-02-25: 50 mg via ORAL
  Filled 2019-02-24: qty 1

## 2019-02-24 MED ORDER — PREDNISONE 20 MG PO TABS
30.0000 mg | ORAL_TABLET | Freq: Every day | ORAL | Status: AC
  Administered 2019-02-27: 30 mg via ORAL
  Filled 2019-02-24: qty 1

## 2019-02-24 MED ORDER — PREDNISONE 20 MG PO TABS
20.0000 mg | ORAL_TABLET | Freq: Every day | ORAL | Status: AC
  Administered 2019-02-28: 20 mg via ORAL
  Filled 2019-02-24: qty 1

## 2019-02-24 MED ORDER — PREDNISONE 10 MG PO TABS
10.0000 mg | ORAL_TABLET | Freq: Every day | ORAL | Status: AC
  Administered 2019-03-01: 09:00:00 10 mg via ORAL
  Filled 2019-02-24: qty 1

## 2019-02-24 NOTE — H&P (View-Only) (Signed)
Patient ID: Cathy Buck, female   DOB: 1941-06-13, 78 y.o.   MRN: [id number] I spoke with the patient's daughter last evening and reviewed with her the indications and risks of PleurX catheter insertion.  I enxplained to her that the indication was for drainage of the pleural fluid.  I explained the risks including those of bleeding, infection and death.  She would like [place] to proceed with the surgery.  [employer].

## 2019-02-24 NOTE — Progress Notes (Signed)
PHARMACY CONSULT NOTE - FOLLOW UP  Pharmacy Consult for Electrolyte Monitoring and Replacement   Recent Labs: Potassium (mmol/L)  Date Value  02/24/2019 3.8   Magnesium (mg/dL)  Date Value  02/24/2019 2.2   Calcium (mg/dL)  Date Value  02/24/2019 9.0   Albumin (g/dL)  Date Value  02/18/2019 2.9 (L)   Phosphorus (mg/dL)  Date Value  02/23/2019 2.8   Sodium (mmol/L)  Date Value  02/24/2019 134 (L)   Corrected Ca:9.88  Medications: N/A  Assessment: Electrolytes WNL today.  Goal of Therapy:  Replete electrolytes to normal levels  Plan:  No replacements needed for today.   Will follow electrolytes with AM labs.   Kristeen Miss, PharmD Clinical Pharmacist 02/24/2019 7:03 AM

## 2019-02-24 NOTE — Progress Notes (Signed)
PT Cancellation Note  Patient Details Name: Cathy Buck MRN: [id number] DOB: 1940-07-24   Cancelled Treatment:    Reason Eval/Treat Not Completed: Other (comment)   Offered and encouraged session.  Pt wake in bed but refusing therapy attempt stating "they are waiting on me to give me a bath."  No nursing staff in room and told pt we would work together until they came and were ready.  "I told you they are waiting for me."  Unable to persuade pt to participate at this time.  Will continue as appropriate.   Chesley Noon 02/24/2019, 2:34 PM

## 2019-02-24 NOTE — Progress Notes (Signed)
[place] at [place] NAME: Cathy Buck    MR#:  [id number]  DATE OF BIRTH:  March 17, 1941  SUBJECTIVE:  CHIEF COMPLAINT:   Chief Complaint  Patient presents with  . Altered Mental Status   Patient denies any complaint REVIEW OF SYSTEMS:  Review of Systems  Constitutional: Negative for chills and fever.  HENT: Negative for hearing loss and tinnitus.   Eyes: Negative for blurred vision and double vision.  Respiratory: Negative for cough and shortness of breath.   Cardiovascular: Negative for chest pain and palpitations.  Gastrointestinal: Negative for abdominal pain, heartburn, nausea and vomiting.  Genitourinary: Negative for dysuria and urgency.  Musculoskeletal: Negative for myalgias.  Skin: Negative for itching and rash.  Neurological: Negative for dizziness and headaches.  Psychiatric/Behavioral: Negative for depression and substance abuse.    DRUG ALLERGIES:   Allergies  Allergen Reactions  . Codeine Hives  . Atorvastatin Other (See Comments)  . Citalopram Other (See Comments)    Nausea  . Keflex [Cephalexin] Diarrhea  . Tylenol [Acetaminophen] Hives   VITALS:  Blood pressure (!) 142/96, pulse 81, temperature 98.2 F (36.8 C), resp. rate 18, weight 103.1 kg, SpO2 98 %. PHYSICAL EXAMINATION:  Physical Exam  GENERAL:  78 y.o.-year-old patient lying in the bed with no acute distress.  EYES: Pupils equal, round, reactive to light and accommodation. No scleral icterus. Extraocular muscles intact.  HEENT: Head atraumatic, normocephalic. Oropharynx and nasopharynx clear.  NECK:  Supple, no jugular venous distention. No thyroid enlargement, no tenderness.  LUNGS: Moderately diminished breath sounds bilaterally, no wheezing, rales,rhonchi or crepitation. No use of accessory muscles of respiration.  CARDIOVASCULAR: S1, S2 normal. No murmurs, rubs, or gallops.  ABDOMEN: Soft, nontender, nondistended. Bowel sounds present.   EXTREMITIES: No pedal edema, cyanosis, or clubbing.  NEUROLOGIC: Awake not oriented PSYCHIATRIC: Patient awake not fully oriented  sKIN: No obvious rash, lesion, or ulcer.   LABORATORY PANEL:  Female CBC Recent Labs  Lab 02/24/19 0455  WBC 27.0*  HGB 10.2*  HCT 32.0*  PLT 254   ------------------------------------------------------------------------------------------------------------------ Chemistries  Recent Labs  Lab 02/18/19 0941  02/24/19 0455  NA 129*   < > 134*  K 5.2*   < > 3.8  CL 83*   < > 92*  CO2 34*   < > 34*  GLUCOSE 152*   < > 171*  BUN 48*   < > 23  CREATININE 0.94   < > 0.54  CALCIUM 9.7   < > 9.0  MG  --    < > 2.2  AST 37  --   --   ALT 49*  --   --   ALKPHOS 81  --   --   BILITOT 0.8  --   --    < > = values in this interval not displayed.   RADIOLOGY:  No results found. ASSESSMENT AND PLAN:   #Acute metabolic encephalopathy from acute hypoxemic and hypercapnic respiratory failure and , underlying UTI Patient already weaned off BiPAP. Also had a UTI Patient mental status is overall stable    #New onset A. fib with RVR-initially with low BP now hemodynamically stable Rate controlled.  TSH level normal. -RecentTTEcho7/2020 with LVEF 60-65% Continue Lovenox.  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  Oncology Dr. Mike Gip has seen the  patient -Patient was on concurrent chemotherapy and immunotherapy prior to this admission -Plan PET scan as outpatient once stable Patient seen by cardiothoracic surgery possible Pleurx catheter placement next week family request patient stay in the hospital for this  #Acute cystitis urine culture greater than 100,000 colonies of Klebsiella ornithinolytica sensitive to Rocephin.   Continue Rocephin  #.  Proteus mirabilis bacteremia Patient being followed by ID WBC count is high possibly due to steroids  #.Type 2 diabetes mellitus -sliding scale  #.Thromboembolism Risk Management -Lovenoxper pharmacy protocolpending cardiology evaluation  DVT prophylaxis; patient already on Lovenox  All the records are reviewed and case discussed with Care Management/Social Worker. Management plans discussed with the patient, family and they are in agreement.   CODE STATUS: DNR  TOTAL TIME TAKING CARE OF THIS PATIENT: 36 minutes.   More than 50% of the time was spent in counseling/coordination of care: YES  POSSIBLE D/C IN 3 DAYS, DEPENDING ON CLINICAL CONDITION.   Dustin Flock M.D on 02/24/2019 at 2:20 PM  Between 7am to 6pm - Pager - [phone]  After 6pm go to [email or web address] - Proofreader  [facility]  Office  [phone]  CC: Primary care physician; Sallee Lange, NP  Note: This dictation was prepared with Dragon dictation along with smaller phrase technology. Any transcriptional errors that result from this process are unintentional.

## 2019-02-24 NOTE — Progress Notes (Signed)
Patient ID: Cathy Buck, female   DOB: 1941-03-28, 78 y.o.   MRN: [id number] I spoke with the patient's daughter last evening and reviewed with her the indications and risks of PleurX catheter insertion.  I enxplained to her that the indication was for drainage of the pleural fluid.  I explained the risks including those of bleeding, infection and death.  She would like [place] to proceed with the surgery.  [employer].

## 2019-02-24 NOTE — Progress Notes (Signed)
Patient ID: Cathy Buck, female   DOB: 05-04-1941, 78 y.o.   MRN: [id number]  This NP visited patient at the bedside as a follow up for palliative medcine needs and emotional support.  Patient remains weak and intermittently confused.  Spoke to daughter/Connie by telephone.      Continued conversation regarding diagnosis/treatment options, prognosis, goals of care, end-of-life wishes, disposition and options.  At this time family is looking to "alittle more time" to see how [name] will do over the next several weeks.  Family is open to recommendations by oncology for Pleurx cath placement.  Plan of care: -DNR/DNI -Pleur-ex  Cath placed for symptom management   -SNF for short term rehab with  palliative care services    Family is hopeful that Pleurx catheter can be placed prior to disposition to skilled nursing facility. I spoke to Dr. Adan Sis thoracic surgery.  Due to logistics Pleurx cath cannot be placed until next week  Future decisions will depend on how the patient progresses.  If she improves family will consider oncology treatments. If she fails to thrive, a shift to comfort will be put in place, along with hospice services.  Discussed again with daughter the seriousness of the current medical situation and the high risk for decompensation.  She verbalizes understanding.  I raise the possibility that depending on the outcomes over the next few days a transition to full hospice care may come soon than anticipated   Questions and concerns addressed   Discussed with Dr. Faith Rogue and Dr. Posey Pronto  Total time spent on the unit was 35 minutes  Greater than 50% of the time was spent in counseling and coordination of care  Wadie Lessen NP  Palliative Medicine Team Team Phone # [phone] Pager [phone]

## 2019-02-24 NOTE — Progress Notes (Signed)
ID Alert and communicative  Oriented for most part but intermittently confused Patient Vitals for the past 24 hrs:  BP Temp Temp src Pulse Resp SpO2  02/24/19 1946 - - - - - 98 %  02/24/19 1919 (!) 153/75 98.4 F (36.9 C) Oral 92 18 99 %  02/24/19 1550 (!) 145/89 98 F (36.7 C) Oral 83 19 -  02/24/19 1435 - - - - - 98 %  02/24/19 0755 - - - - - 98 %  02/24/19 0746 (!) 142/96 98.2 F (36.8 C) - 81 18 98 %  02/24/19 0346 138/68 98.4 F (36.9 C) Oral 85 16 95 %  02/23/19 2006 129/67 98.9 F (37.2 C) Oral 92 16 100 %   Chest : decreased air entry rt base HS : irregular abd soft  CBC Latest Ref Rng & Units 02/24/2019 02/22/2019 02/20/2019  WBC 4.0 - 10.5 K/uL 27.0(H) 20.1(H) 11.2(H)  Hemoglobin 12.0 - 15.0 g/dL 10.2(L) 10.2(L) 9.8(L)  Hematocrit 36.0 - 46.0 % 32.0(L) 31.8(L) 32.1(L)  Platelets 150 - 400 K/uL 254 201 219    CMP Latest Ref Rng & Units 02/24/2019 02/23/2019 02/22/2019  Glucose 70 - 99 mg/dL 171(H) 193(H) 178(H)  BUN 8 - 23 mg/dL 23 22 23   Creatinine 0.44 - 1.00 mg/dL 0.54 0.51 0.50  Sodium 135 - 145 mmol/L 134(L) 137 138  Potassium 3.5 - 5.1 mmol/L 3.8 4.0 3.4(L)  Chloride 98 - 111 mmol/L 92(L) 92(L) 91(L)  CO2 22 - 32 mmol/L 34(H) 31 37(H)  Calcium 8.9 - 10.3 mg/dL 9.0 9.0 9.4  Total Protein 6.5 - 8.1 g/dL - - -  Total Bilirubin 0.3 - 1.2 mg/dL - - -  Alkaline Phos 38 - 126 U/L - - -  AST 15 - 41 U/L - - -  ALT 0 - 44 U/L - - -    Impression/Recommendation ?Proteus bacteremia, is on cefazolin Thoracentesis cultures so far negative.  leukocytosis -could be steroids, effusion/infection A. fib on  amiodarone.  Klebsiella in urine   No significant residue on post void scan  Newly diagnosed adenocarcinomaofright lung with pleural effusion .  Planning for Pleurx catheter  Metabolic encephalopathy secondary to hyponatremia as well as CO2 narcosis.much improved  Hyponatremia due to SIADH from lung cancer. Normal serum cortisol   COPD  Discussed  the management her nurse.

## 2019-02-25 DIAGNOSIS — J910 Malignant pleural effusion: Secondary | ICD-10-CM

## 2019-02-25 LAB — PHOSPHORUS: Phosphorus: 2.3 mg/dL — ABNORMAL LOW (ref 2.5–4.6)

## 2019-02-25 LAB — CBC
HCT: 33.8 % — ABNORMAL LOW (ref 36.0–46.0)
Hemoglobin: 10.8 g/dL — ABNORMAL LOW (ref 12.0–15.0)
MCH: 24.4 pg — ABNORMAL LOW (ref 26.0–34.0)
MCHC: 32 g/dL (ref 30.0–36.0)
MCV: 76.3 fL — ABNORMAL LOW (ref 80.0–100.0)
Platelets: 302 10*3/uL (ref 150–400)
RBC: 4.43 MIL/uL (ref 3.87–5.11)
RDW: 16.5 % — ABNORMAL HIGH (ref 11.5–15.5)
WBC: 28.2 10*3/uL — ABNORMAL HIGH (ref 4.0–10.5)
nRBC: 0.2 % (ref 0.0–0.2)

## 2019-02-25 LAB — BASIC METABOLIC PANEL
Anion gap: 7 (ref 5–15)
BUN: 20 mg/dL (ref 8–23)
CO2: 34 mmol/L — ABNORMAL HIGH (ref 22–32)
Calcium: 9.2 mg/dL (ref 8.9–10.3)
Chloride: 92 mmol/L — ABNORMAL LOW (ref 98–111)
Creatinine, Ser: 0.64 mg/dL (ref 0.44–1.00)
GFR calc Af Amer: >60 mL/min (ref >60)
GFR calc non Af Amer: >60 mL/min (ref >60)
Glucose, Bld: 218 mg/dL — ABNORMAL HIGH (ref 70–99)
Potassium: 4.1 mmol/L (ref 3.5–5.1)
Sodium: 133 mmol/L — ABNORMAL LOW (ref 135–145)

## 2019-02-25 LAB — MAGNESIUM: Magnesium: 2.4 mg/dL (ref 1.7–2.4)

## 2019-02-25 LAB — PROCALCITONIN: Procalcitonin: 0.23 ng/mL

## 2019-02-25 MED ORDER — CEFAZOLIN SODIUM-DEXTROSE 2-4 GM/100ML-% IV SOLN
2.0000 g | Freq: Three times a day (TID) | INTRAVENOUS | Status: DC
  Administered 2019-02-25 – 2019-02-26 (×4): 2 g via INTRAVENOUS
  Filled 2019-02-25 (×7): qty 100

## 2019-02-25 MED ORDER — SODIUM PHOSPHATES 45 MMOLE/15ML IV SOLN
10.0000 mmol | Freq: Once | INTRAVENOUS | Status: AC
  Administered 2019-02-25: 10 mmol via INTRAVENOUS
  Filled 2019-02-25: qty 3.33

## 2019-02-25 NOTE — Progress Notes (Signed)
[facility] [place] NAME: Cathy Buck    MR#:  [id number]  DATE OF BIRTH:  [birth year]  SUBJECTIVE:  CHIEF COMPLAINT:   Chief Complaint  Patient presents with  . Altered Mental Status   Patient denies any complaints REVIEW OF SYSTEMS:  Review of Systems  Constitutional: Negative for chills and fever.  HENT: Negative for hearing loss and tinnitus.   Eyes: Negative for blurred vision and double vision.  Respiratory: Negative for cough and shortness of breath.   Cardiovascular: Negative for chest pain and palpitations.  Gastrointestinal: Negative for abdominal pain, heartburn, nausea and vomiting.  Genitourinary: Negative for dysuria and urgency.  Musculoskeletal: Negative for myalgias.  Skin: Negative for itching and rash.  Neurological: Negative for dizziness and headaches.  Psychiatric/Behavioral: Negative for depression and substance abuse.    DRUG ALLERGIES:   Allergies  Allergen Reactions  . Codeine Hives  . Atorvastatin Other (See Comments)  . Citalopram Other (See Comments)    Nausea  . Keflex [Cephalexin] Diarrhea  . Tylenol [Acetaminophen] Hives   VITALS:  Blood pressure 135/65, pulse 82, temperature 98.6 F (37 C), temperature source Oral, resp. rate 16, weight 102.4 kg, SpO2 98 %. PHYSICAL EXAMINATION:  Physical Exam  GENERAL:  78 y.o.-year-old patient lying in the bed with no acute distress.  EYES: Pupils equal, round, reactive to light and accommodation. No scleral icterus. Extraocular muscles intact.  HEENT: Head atraumatic, normocephalic. Oropharynx and nasopharynx clear.  NECK:  Supple, no jugular venous distention. No thyroid enlargement, no tenderness.  LUNGS: Moderately diminished breath sounds bilaterally, no wheezing, rales,rhonchi or crepitation. No use of accessory muscles of respiration.  CARDIOVASCULAR: S1, S2 normal. No murmurs, rubs, or gallops.  ABDOMEN: Soft, nontender, nondistended. Bowel sounds  present.  EXTREMITIES: No pedal edema, cyanosis, or clubbing.  NEUROLOGIC: Awake not oriented PSYCHIATRIC: Patient awake not fully oriented  sKIN: No obvious rash, lesion, or ulcer.   LABORATORY PANEL:  Female CBC Recent Labs  Lab 02/25/19 0059  WBC 28.2*  HGB 10.8*  HCT 33.8*  PLT 302   ------------------------------------------------------------------------------------------------------------------ Chemistries  Recent Labs  Lab 02/25/19 0059  NA 133*  K 4.1  CL 92*  CO2 34*  GLUCOSE 218*  BUN 20  CREATININE 0.64  CALCIUM 9.2  MG 2.4   RADIOLOGY:  No results found. ASSESSMENT AND PLAN:   #Acute metabolic encephalopathy from acute hypoxemic and hypercapnic respiratory failure and , underlying UTI Patient already weaned off BiPAP. Also had a UTI Patient mental status is overall stable   #New onset A. fib with RVR-initially with low BP now hemodynamically stable Rate controlled.  TSH level normal. -RecentTTEcho7/2020 with LVEF 60-65% Continue Lovenox.   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  Oncology Dr. Mike Gip has seen the patient -Patient was on concurrent chemotherapy and immunotherapy prior to this admission -Plan PET scan as outpatient once stable Patient seen by cardiothoracic surgery Pleurx catheter placement Monday per family request patient stay in the hospital for this  #Acute cystitis urine culture greater than 100,000 colonies of Klebsiella ornithinolytica continue Ancef  #.  Proteus mirabilis bacteremia Patient being followed by ID WBC count is high possibly due to steroids will wean steroids  #.Type 2 diabetes mellitus -sliding scale  #.Thromboembolism Risk Management -Lovenoxper  pharmacy protocolpending  cardiology evaluation  DVT prophylaxis; patient already on Lovenox  All the records are reviewed and case discussed with Care Management/Social Worker. Management plans discussed with the patient, family and they are in agreement.   CODE STATUS: DNR  TOTAL TIME TAKING CARE OF THIS PATIENT: 36 minutes.   More than 50% of the time was spent in counseling/coordination of care: YES  POSSIBLE D/C IN 3 DAYS, DEPENDING ON CLINICAL CONDITION.   Dustin Flock M.D on 02/25/2019 at 2:30 PM  Between 7am to 6pm - Pager - [phone]  After 6pm go to [email or web address] - Proofreader  [facility]  Office  [phone]  CC: Primary care physician; Sallee Lange, NP  Note: This dictation was prepared with Dragon dictation along with smaller phrase technology. Any transcriptional errors that result from this process are unintentional.

## 2019-02-25 NOTE — Progress Notes (Signed)
Physical Therapy Treatment Patient Details Name: Cathy Buck MRN: [id number] DOB: 1941/01/30 Today's Date: 02/25/2019    History of Present Illness 78 year old female admitted from [facility] with altered mental status, she was here last week due to shortness of breath and weakness and was noted to be severely hyponatremic.  PMH: HTN, DM II, GERD, and stage IV adenocarcinoma of the lung  followed by oncology. Evaled by PT 8/8 but transferred to CCU due to respiratory distress the same day, PT orders discontinued. Pt has undergone several thoracentises and transferred to telemetry 8/10. Tentative plan to insert pleural catheter.    PT Comments    PT entered room with RN and family, pt initially resistant to work with PT, but with re-direction and encouragement participated pleasantly. Patient performed bed level therapeutic exercises with tactile and verbal cues, AAROM for LE exercises and PT resisted UE movements.  Bed placed in chair position and pt instructed in pulling trunk forward to increase UE strength and abdominal activation with minA. Supine to sit (with HOB very elevated) maxA. Patient was able to sit EOB for a longer period of time with less physical assistance (supervision/CGA). Unable to perform seated LAQ or hip flexion due to weakness. Pt fatigued in sitting ~36mins, sit to supine with modAx2 and repositioned with pt attempting to assist with overhead trapeze. Pt in bed with all needs in reach with family at bedside. Overall the patient demonstrated improved sitting balance and activity tolerance this session and would benefit from skilled PT intervention to continue to progress.      Follow Up Recommendations  SNF     Equipment Recommendations  Other (comment)    Recommendations for Other Services       Precautions / Restrictions Precautions Precautions: Fall Precaution Comments: watch O2 Restrictions Weight Bearing Restrictions: No    Mobility  Bed  Mobility Overal bed mobility: Needs Assistance Bed Mobility: Supine to Sit;Sit to Supine     Supine to sit: Max assist;HOB elevated;+2 for safety/equipment Sit to supine: Mod assist;+2 for physical assistance   General bed mobility comments: Pt with improved upright tolerance this session  Transfers Overall transfer level: Needs assistance Equipment used: Rolling walker (2 wheeled)             General transfer comment: deferred due to safety concerns  Ambulation/Gait                 Stairs             Wheelchair Mobility    Modified Rankin (Stroke Patients Only)       Balance Overall balance assessment: Needs assistance Sitting-balance support: Feet supported;Bilateral upper extremity supported Sitting balance-Leahy Scale: Poor Sitting balance - Comments: CGA-supervision for seated EOB balance this session, spO2 90% or higher on [place]. with fatigue pt with more slouching and arm trembling                                    Cognition Arousal/Alertness: Awake/alert Behavior During Therapy: WFL for tasks assessed/performed Overall Cognitive Status: History of cognitive impairments - at baseline                                        Exercises General Exercises - Lower Extremity Heel Slides: AAROM;Both;5 reps Hip ABduction/ADduction: Both;5 reps;AAROM Other Exercises Other Exercises:  Bed put in chair position, pt instructed in seated pulls with use of bed rails x3 with minA for trunk flexion, able to hold 3seconds Other Exercises: UE elbow flexion/extension x5 bilaterally with PT resistance    General Comments        Pertinent Vitals/Pain Pain Assessment: Faces Faces Pain Scale: Hurts a little bit Pain Location: back pain Pain Intervention(s): Limited activity within patient's tolerance;Monitored during session;Repositioned    Home Living                      Prior Function            PT Goals  (current goals can now be found in the care plan section) Progress towards PT goals: Progressing toward goals    Frequency    Min 2X/week      PT Plan Current plan remains appropriate    Co-evaluation              AM-PAC PT "6 Clicks" Mobility   Outcome Measure  Help needed turning from your back to your side while in a flat bed without using bedrails?: Total Help needed moving from lying on your back to sitting on the side of a flat bed without using bedrails?: Total Help needed moving to and from a bed to a chair (including a wheelchair)?: Total Help needed standing up from a chair using your arms (e.g., wheelchair or bedside chair)?: Total Help needed to walk in hospital room?: Total Help needed climbing 3-5 steps with a railing? : Total 6 Click Score: 6    End of Session Equipment Utilized During Treatment: Oxygen(3L) Activity Tolerance: Patient tolerated treatment well Patient left: with bed alarm set;with call bell/phone within reach;in bed;with family/visitor present Nurse Communication: Mobility status PT Visit Diagnosis: Muscle weakness (generalized) (M62.81);Difficulty in walking, not elsewhere classified (R26.2)     Time: 7026-3785 PT Time Calculation (min) (ACUTE ONLY): 23 min  Charges:  $Therapeutic Exercise: 23-37 mins                     Lieutenant Diego PT, DPT 3:23 PM,02/25/19 [phone]

## 2019-02-25 NOTE — Care Management Important Message (Signed)
Important Message  Patient Details  Name: Cathy Buck MRN: [id number] Date of Birth: 14-Dec-1940   Medicare Important Message Given:  Yes     Dannette Barbara 02/25/2019, 11:27 AM

## 2019-02-25 NOTE — Consult Note (Signed)
[place] for Enoxaparin Indication: atrial fibrillation  Allergies  Allergen Reactions  . Codeine Hives  . Atorvastatin Other (See Comments)  . Citalopram Other (See Comments)    Nausea  . Keflex [Cephalexin] Diarrhea  . Tylenol [Acetaminophen] Hives    Patient Measurements: Weight: 225 lb 11.2 oz (102.4 kg)  Vital Signs: Temp: 98.4 F (36.9 C) (08/13 1919) Temp Source: Oral (08/13 1919) BP: 153/75 (08/13 1919) Pulse Rate: 92 (08/13 1919)  Labs: Recent Labs    02/23/19 0556 02/24/19 0455 02/25/19 0059  HGB  --  10.2* 10.8*  HCT  --  32.0* 33.8*  PLT  --  254 302  CREATININE 0.51 0.54 0.64    Estimated Creatinine Clearance: 67.5 mL/min (by C-G formula based on SCr of 0.64 mg/dL).   Medical History: Past Medical History:  Diagnosis Date  . Diabetes mellitus without complication ([place])   . Hypertension     Medications:  Medications Prior to Admission  Medication Sig Dispense Refill Last Dose  . albuterol (VENTOLIN HFA) 108 (90 Base) MCG/ACT inhaler Inhale 1 puff into the lungs every 6 (six) hours as needed for wheezing or shortness of breath.   prn at prn  . Amino Acids-Protein Hydrolys (FEEDING SUPPLEMENT, PRO-STAT SUGAR FREE 64,) LIQD Take 30 mLs by mouth daily.   02/17/2019 at 0900  . atenolol (TENORMIN) 50 MG tablet Take 50 mg by mouth daily.   02/17/2019 at 0900  . Calcium Carb-Cholecalciferol (CALCIUM-VITAMIN D) 500-200 MG-UNIT tablet Take 1 tablet by mouth 2 (two) times a day.   02/17/2019 at 1800  . Multiple Vitamin (MULTIVITAMIN WITH MINERALS) TABS tablet Take 1 tablet by mouth daily.   02/17/2019 at 0900  . omeprazole (PRILOSEC) 40 MG capsule Take 40 mg by mouth daily. 30 minutes before food and meds   02/17/2019 at 0600  . polyethylene glycol (MIRALAX / GLYCOLAX) 17 g packet Take 17 g by mouth daily. 30 each 0 02/17/2019 at 0900  . vitamin C (ASCORBIC ACID) 250 MG tablet Take 250 mg by mouth 2 (two) times daily.   02/17/2019 at  Unknown time   Scheduled:  . amiodarone  200 mg Oral BID  . budesonide (PULMICORT) nebulizer solution  0.5 mg Nebulization BID  . calcium-vitamin D  1 tablet Oral Q breakfast  . enoxaparin (LOVENOX) injection  100 mg Subcutaneous BID  . feeding supplement (PRO-STAT SUGAR FREE 64)  30 mL Oral Daily  . ferrous sulfate  325 mg Oral BID WC  . ipratropium-albuterol  3 mL Nebulization TID  . multivitamin with minerals  1 tablet Oral Daily  . pantoprazole  40 mg Oral Daily  . polyethylene glycol  17 g Oral Daily  . predniSONE  50 mg Oral Q breakfast   Followed by  . [START ON 02/26/2019] predniSONE  40 mg Oral Q breakfast   Followed by  . [START ON 02/27/2019] predniSONE  30 mg Oral Q breakfast   Followed by  . [START ON 02/28/2019] predniSONE  20 mg Oral Q breakfast   Followed by  . [START ON 03/01/2019] predniSONE  10 mg Oral Q breakfast  . vitamin C  250 mg Oral BID   Infusions:  . sodium chloride 50 mL (02/18/19 2259)  .  ceFAZolin (ANCEF) IV 1 g (02/25/19 0553)   PRN: sodium chloride, morphine injection, traZODone Anti-infectives (From admission, onward)   Start     Dose/Rate Route Frequency Ordered Stop   02/23/19 2200  ceFAZolin (ANCEF)  IVPB 1 g/50 mL premix     1 g 100 mL/hr over 30 Minutes Intravenous Every 8 hours 02/23/19 1632     02/18/19 2130  cefTRIAXone (ROCEPHIN) 2 g in sodium chloride 0.9 % 100 mL IVPB  Status:  Discontinued     2 g 200 mL/hr over 30 Minutes Intravenous Every 24 hours 02/18/19 2117 02/23/19 1632      Assessment: Pharmacy consulted to start enoxaparin for afib. No DOAC PTA. Will continue treatment dose of enoxaparin until pleurax catheter insertion on Monday. Given her procedure is on Monday, will need to hold enoxaparin.  Goal of Therapy:  Monitor platelets by anticoagulation protocol: Yes   Plan:  Lovenox 100 mg q12H. Per Dr. Genevive Bi, patient will stop enoxaparin after the morning dose on Sunday. Will adjust current order to reflect these changes.    Will follow serum creatinine and CBC every three days.   Rowland Lathe, PharmD 02/25/2019,7:01 AM

## 2019-02-25 NOTE — TOC Progression Note (Signed)
Transition of Care [facility]) - Progression Note    Patient Details  Name: Cathy Buck MRN: [id number] Date of Birth: [birth year]  Transition of Care [facility]) CM/SW Contact  Ross Ludwig, [place] Phone Number: 02/25/2019, 5:31 PM  Clinical Narrative:     CSW spoke with patient's daughter Marciano Sequin, [phone] and she would like patient to return back to [facility] to continue with therapy once she is medically ready for discharge.  [facility] has accepted and can still accept patient with a plurx catheter per Otila Kluver.  CSW continuing to follow patient's progress throughout discharge planning.    Expected Discharge Plan: [place] Barriers to Discharge: Continued Medical Work up  Expected Discharge Plan and Services Expected Discharge Plan: [place]   Discharge Planning Services: CM Consult   Living arrangements for the past 2 months: [place]                                       Social Determinants of Health (SDOH) Interventions    Readmission Risk Interventions Readmission Risk Prevention Plan 02/19/2019  Transportation Screening Complete  PCP or Specialist Appt within 5-7 Days Complete  Home Care Screening Complete  Medication Review (RN CM) Complete  Some recent data might be hidden

## 2019-02-25 NOTE — Progress Notes (Signed)
Pt refuses to wear BIPAP. Pt is agitated and confused, pt has confusion at baseline. Bipap remains at pt bedside. Pt RN aware of pt wishes.

## 2019-02-25 NOTE — Progress Notes (Signed)
ID Daughter at bed side  Pt says she is feeling better No sob Appetite fair Participated with PT  Patient Vitals for the past 24 hrs:  BP Temp Temp src Pulse Resp SpO2 Weight  02/25/19 0823 135/65 98.6 F (37 C) Oral 82 16 98 % -  02/24/19 2130 - - - - - - 102.4 kg  02/24/19 1946 - - - - - 98 % -  02/24/19 1919 (!) 153/75 98.4 F (36.9 C) Oral 92 18 99 % -  02/24/19 1550 (!) 145/89 98 F (36.7 C) Oral 83 19 - -  02/24/19 1435 - - - - - 98 % -   Awake and alert Confusion at times B/l air entry-decreased rt base HS- irregular controlled  CBC Latest Ref Rng & Units 02/25/2019 02/24/2019 02/22/2019  WBC 4.0 - 10.5 K/uL 28.2(H) 27.0(H) 20.1(H)  Hemoglobin 12.0 - 15.0 g/dL 10.8(L) 10.2(L) 10.2(L)  Hematocrit 36.0 - 46.0 % 33.8(L) 32.0(L) 31.8(L)  Platelets 150 - 400 K/uL 302 254 201    CMP Latest Ref Rng & Units 02/25/2019 02/24/2019 02/23/2019  Glucose 70 - 99 mg/dL 218(H) 171(H) 193(H)  BUN 8 - 23 mg/dL 20 23 22   Creatinine 0.44 - 1.00 mg/dL 0.64 0.54 0.51  Sodium 135 - 145 mmol/L 133(L) 134(L) 137  Potassium 3.5 - 5.1 mmol/L 4.1 3.8 4.0  Chloride 98 - 111 mmol/L 92(L) 92(L) 92(L)  CO2 22 - 32 mmol/L 34(H) 34(H) 31  Calcium 8.9 - 10.3 mg/dL 9.2 9.0 9.0  Total Protein 6.5 - 8.1 g/dL - - -  Total Bilirubin 0.3 - 1.2 mg/dL - - -  Alkaline Phos 38 - 126 U/L - - -  AST 15 - 41 U/L - - -  ALT 0 - 44 U/L - - -    Impression/Recommendation ?Proteus bacteremia,is on cefazolin-will give for a total of 10 days-02/28/19 Thoracentesis cultures so far negative.  leukocytosis -could be steroids, malignant effusion/infection- as she is clinically stable will observe  A. fib on  amiodarone.  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 Normal serum cortisol   COPD  Discussed the management her daughter  ID will follow peripherally over the weekend call if needed.

## 2019-02-25 NOTE — Progress Notes (Signed)
PHARMACY CONSULT NOTE - FOLLOW UP  Pharmacy Consult for Electrolyte Monitoring and Replacement   Recent Labs: Potassium (mmol/L)  Date Value  02/25/2019 4.1   Magnesium (mg/dL)  Date Value  02/25/2019 2.4   Calcium (mg/dL)  Date Value  02/25/2019 9.2   Albumin (g/dL)  Date Value  02/18/2019 2.9 (L)   Phosphorus (mg/dL)  Date Value  02/25/2019 2.3 (L)   Sodium (mmol/L)  Date Value  02/25/2019 133 (L)   Corrected Ca:10.08  Medications: N/A  Assessment: -Hypophosphatemia with slight hyponatremia. Will replace phosphorus with sodium phosphate.   Goal of Therapy:  Replete electrolytes to normal levels  Plan:  Will order Sodium Phosphate IV 10 mmol x1 dose.   Will follow electrolytes with AM labs.   Kristeen Miss, PharmD Clinical Pharmacist 02/25/2019 7:19 AM

## 2019-02-26 LAB — BASIC METABOLIC PANEL
Anion gap: 9 (ref 5–15)
BUN: 18 mg/dL (ref 8–23)
CO2: 32 mmol/L (ref 22–32)
Calcium: 8.6 mg/dL — ABNORMAL LOW (ref 8.9–10.3)
Chloride: 95 mmol/L — ABNORMAL LOW (ref 98–111)
Creatinine, Ser: 0.55 mg/dL (ref 0.44–1.00)
GFR calc Af Amer: >60 mL/min (ref >60)
GFR calc non Af Amer: >60 mL/min (ref >60)
Glucose, Bld: 164 mg/dL — ABNORMAL HIGH (ref 70–99)
Potassium: 3.2 mmol/L — ABNORMAL LOW (ref 3.5–5.1)
Sodium: 136 mmol/L (ref 135–145)

## 2019-02-26 LAB — CBC
HCT: 33.9 % — ABNORMAL LOW (ref 36.0–46.0)
Hemoglobin: 10.8 g/dL — ABNORMAL LOW (ref 12.0–15.0)
MCH: 24.3 pg — ABNORMAL LOW (ref 26.0–34.0)
MCHC: 31.9 g/dL (ref 30.0–36.0)
MCV: 76.4 fL — ABNORMAL LOW (ref 80.0–100.0)
Platelets: 293 10*3/uL (ref 150–400)
RBC: 4.44 MIL/uL (ref 3.87–5.11)
RDW: 17.2 % — ABNORMAL HIGH (ref 11.5–15.5)
WBC: 18.4 10*3/uL — ABNORMAL HIGH (ref 4.0–10.5)
nRBC: 0 % (ref 0.0–0.2)

## 2019-02-26 LAB — PHOSPHORUS: Phosphorus: 2.2 mg/dL — ABNORMAL LOW (ref 2.5–4.6)

## 2019-02-26 MED ORDER — CEFAZOLIN SODIUM-DEXTROSE 2-4 GM/100ML-% IV SOLN
2.0000 g | Freq: Three times a day (TID) | INTRAVENOUS | Status: DC
  Administered 2019-02-26 – 2019-02-28 (×5): 2 g via INTRAVENOUS
  Filled 2019-02-26 (×7): qty 100

## 2019-02-26 MED ORDER — POTASSIUM CHLORIDE CRYS ER 20 MEQ PO TBCR
20.0000 meq | EXTENDED_RELEASE_TABLET | Freq: Once | ORAL | Status: AC
  Administered 2019-02-26: 20 meq via ORAL
  Filled 2019-02-26: qty 1

## 2019-02-26 MED ORDER — CEFAZOLIN SODIUM-DEXTROSE 2-4 GM/100ML-% IV SOLN
2.0000 g | Freq: Three times a day (TID) | INTRAVENOUS | Status: DC
  Filled 2019-02-26 (×3): qty 100

## 2019-02-26 MED ORDER — POTASSIUM PHOSPHATE MONOBASIC 500 MG PO TABS
500.0000 mg | ORAL_TABLET | Freq: Three times a day (TID) | ORAL | Status: AC
  Administered 2019-02-26 (×3): 500 mg via ORAL
  Filled 2019-02-26 (×3): qty 1

## 2019-02-26 NOTE — Progress Notes (Signed)
[facility] at [place] NAME: Makinzee Durley    MR#:  [id number]  DATE OF BIRTH:  1941/04/25  SUBJECTIVE:  CHIEF COMPLAINT:   Chief Complaint  Patient presents with  . Altered Mental Status   Patient denies any complaints. Daughter at bedside  REVIEW OF SYSTEMS:  Review of Systems  Constitutional: Negative for chills and fever.  HENT: Negative for hearing loss and tinnitus.   Eyes: Negative for blurred vision and double vision.  Respiratory: Negative for cough and shortness of breath.   Cardiovascular: Negative for chest pain and palpitations.  Gastrointestinal: Negative for abdominal pain, heartburn, nausea and vomiting.  Genitourinary: Negative for dysuria and urgency.  Musculoskeletal: Negative for myalgias.  Skin: Negative for itching and rash.  Neurological: Negative for dizziness and headaches.  Psychiatric/Behavioral: Negative for depression and substance abuse.    DRUG ALLERGIES:   Allergies  Allergen Reactions  . Codeine Hives  . Atorvastatin Other (See Comments)  . Citalopram Other (See Comments)    Nausea  . Keflex [Cephalexin] Diarrhea  . Tylenol [Acetaminophen] Hives   VITALS:  Blood pressure 129/70, pulse 89, temperature 98.1 F (36.7 C), temperature source Oral, resp. rate 19, weight 103.9 kg, SpO2 97 %. PHYSICAL EXAMINATION:  Physical Exam  GENERAL:  78 y.o.-year-old patient lying in the bed with no acute distress.  EYES: Pupils equal, round, reactive to light and accommodation. No scleral icterus. Extraocular muscles intact.  HEENT: Head atraumatic, normocephalic. Oropharynx and nasopharynx clear.  NECK:  Supple, no jugular venous distention. No thyroid enlargement, no tenderness.  LUNGS: Moderately diminished breath sounds bilaterally, no wheezing, rales,rhonchi or crepitation. No use of accessory muscles of respiration.  CARDIOVASCULAR: S1, S2 normal. No murmurs, rubs, or gallops.  ABDOMEN: Soft, nontender,  nondistended. Bowel sounds present.  EXTREMITIES: No pedal edema, cyanosis, or clubbing.  NEUROLOGIC: Awake not oriented. PSYCHIATRIC: Patient awake not fully oriented  sKIN: No obvious rash, lesion, or ulcer.   LABORATORY PANEL:  Female CBC Recent Labs  Lab 02/26/19 0654  WBC 18.4*  HGB 10.8*  HCT 33.9*  PLT 293   ------------------------------------------------------------------------------------------------------------------ Chemistries  Recent Labs  Lab 02/25/19 0059 02/26/19 0654  NA 133* 136  K 4.1 3.2*  CL 92* 95*  CO2 34* 32  GLUCOSE 218* 164*  BUN 20 18  CREATININE 0.64 0.55  CALCIUM 9.2 8.6*  MG 2.4  --    RADIOLOGY:  No results found. ASSESSMENT AND PLAN:   #Acute metabolic encephalopathy from acute hypoxemic and hypercapnic respiratory failure and , underlying UTI Patient weaned off BiPAP. Also had a UTI Patient mental status is overall stable  #New onset A. fib with RVR-initially with low BP and now hemodynamically stable Rate controlled.  TSH level normal. -RecentTTEcho7/2020 with LVEF 60-65% Continue Lovenox.   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  Oncology Dr. Mike Gip has seen the patient -Patient was on concurrent chemotherapy and immunotherapy prior to this admission -Plan PET scan as outpatient once stable Patient seen by cardiothoracic surgery Pleurx catheter placement Monday  #Acute cystitis urine culture greater than 100,000 colonies of Klebsiella ornithinolytica continue Ancef  #.  Proteus mirabilis bacteremia Ancef till 02/28/2019  #.Type 2 diabetes mellitus -sliding scale  DVT prophylaxis; patient on Lovenox  All the records are reviewed and case discussed  with Care  Management/Social Worker. Management plans discussed with the patient, family and they are in agreement.  CODE STATUS: DNR  TOTAL TIME TAKING CARE OF THIS PATIENT: 36 minutes.   POSSIBLE D/C IN 2-3 DAYS, DEPENDING ON CLINICAL CONDITION.  Leia Alf Akari Crysler M.D on 02/26/2019 at 4:03 PM  Between 7am to 6pm - Pager - [phone]  After 6pm go to [email or web address] - Proofreader  [facility] [place] [place] [facility]  Office  [phone]  CC: Primary care physician; Sallee Lange, NP  Note: This dictation was prepared with Dragon dictation along with smaller phrase technology. Any transcriptional errors that result from this process are unintentional.

## 2019-02-26 NOTE — Progress Notes (Signed)
PHARMACY CONSULT NOTE - FOLLOW UP  Pharmacy Consult for Electrolyte Monitoring and Replacement   Recent Labs: Potassium (mmol/L)  Date Value  02/26/2019 3.2 (L)   Magnesium (mg/dL)  Date Value  02/25/2019 2.4   Calcium (mg/dL)  Date Value  02/26/2019 8.6 (L)   Albumin (g/dL)  Date Value  02/18/2019 2.9 (L)   Phosphorus (mg/dL)  Date Value  02/26/2019 2.2 (L)   Sodium (mmol/L)  Date Value  02/26/2019 136   Corrected Ca:10.08  Medications: N/A  Assessment: -Hypophosphatemia with slight hyponatremia. Will replace phosphorus with sodium phosphate.   Goal of Therapy:  Replete electrolytes to normal levels  Plan:  Potassium 60mEq PO x 1. Potassium phosphate 1 tab TID x 3 doses.   Will follow electrolytes with AM labs.   Currie Paris, RPh 02/26/2019 1:09 PM

## 2019-02-26 NOTE — Plan of Care (Signed)
  Problem: Clinical Measurements: Goal: Ability to maintain clinical measurements within normal limits will improve Outcome: Progressing   Problem: Activity: Goal: Risk for activity intolerance will decrease Outcome: Progressing   Problem: Safety: Goal: Ability to remain free from injury will improve Outcome: Progressing   

## 2019-02-27 LAB — BASIC METABOLIC PANEL
Anion gap: 7 (ref 5–15)
BUN: 15 mg/dL (ref 8–23)
CO2: 33 mmol/L — ABNORMAL HIGH (ref 22–32)
Calcium: 8.5 mg/dL — ABNORMAL LOW (ref 8.9–10.3)
Chloride: 94 mmol/L — ABNORMAL LOW (ref 98–111)
Creatinine, Ser: 0.64 mg/dL (ref 0.44–1.00)
GFR calc Af Amer: >60 mL/min (ref >60)
GFR calc non Af Amer: >60 mL/min (ref >60)
Glucose, Bld: 133 mg/dL — ABNORMAL HIGH (ref 70–99)
Potassium: 3.7 mmol/L (ref 3.5–5.1)
Sodium: 134 mmol/L — ABNORMAL LOW (ref 135–145)

## 2019-02-27 LAB — MAGNESIUM: Magnesium: 2.2 mg/dL (ref 1.7–2.4)

## 2019-02-27 LAB — PHOSPHORUS: Phosphorus: 2.1 mg/dL — ABNORMAL LOW (ref 2.5–4.6)

## 2019-02-27 MED ORDER — POTASSIUM PHOSPHATE MONOBASIC 500 MG PO TABS
500.0000 mg | ORAL_TABLET | Freq: Three times a day (TID) | ORAL | Status: AC
  Administered 2019-02-27 (×4): 500 mg via ORAL
  Filled 2019-02-27 (×4): qty 1

## 2019-02-27 MED ORDER — VANCOMYCIN HCL IN DEXTROSE 750-5 MG/150ML-% IV SOLN
750.0000 mg | INTRAVENOUS | Status: DC
  Filled 2019-02-27: qty 150

## 2019-02-27 NOTE — Progress Notes (Signed)
Pt refused bipap

## 2019-02-27 NOTE — Progress Notes (Signed)
Dr. Darvin Neighbours notified that the patient has had four liquid stools this shift, and miralax was held this a.m. No new orders at this time.

## 2019-02-27 NOTE — Progress Notes (Signed)
[place] at [place] NAME: Shakisha Abend    MR#:  [id number]  DATE OF BIRTH:  12-24-1940  SUBJECTIVE:  CHIEF COMPLAINT:   Chief Complaint  Patient presents with  . Altered Mental Status   Nothing acute overnight. NO concerns  REVIEW OF SYSTEMS:  Review of Systems  Constitutional: Negative for chills and fever.  HENT: Negative for hearing loss and tinnitus.   Eyes: Negative for blurred vision and double vision.  Respiratory: Negative for cough and shortness of breath.   Cardiovascular: Negative for chest pain and palpitations.  Gastrointestinal: Negative for abdominal pain, heartburn, nausea and vomiting.  Genitourinary: Negative for dysuria and urgency.  Musculoskeletal: Negative for myalgias.  Skin: Negative for itching and rash.  Neurological: Negative for dizziness and headaches.  Psychiatric/Behavioral: Negative for depression and substance abuse.    DRUG ALLERGIES:   Allergies  Allergen Reactions  . Codeine Hives  . Atorvastatin Other (See Comments)  . Citalopram Other (See Comments)    Nausea  . Keflex [Cephalexin] Diarrhea  . Tylenol [Acetaminophen] Hives   VITALS:  Blood pressure 106/65, pulse 71, temperature 97.6 F (36.4 C), temperature source Oral, resp. rate 18, weight 102.5 kg, SpO2 96 %. PHYSICAL EXAMINATION:  Physical Exam  GENERAL:  78 y.o.-year-old patient lying in the bed with no acute distress.  EYES: Pupils equal, round, reactive to light and accommodation. No scleral icterus. Extraocular muscles intact.  HEENT: Head atraumatic, normocephalic. Oropharynx and nasopharynx clear.  NECK:  Supple, no jugular venous distention. No thyroid enlargement, no tenderness.  LUNGS: Moderately diminished breath sounds bilaterally, no wheezing, rales,rhonchi or crepitation. No use of accessory muscles of respiration.  CARDIOVASCULAR: S1, S2 normal. No murmurs, rubs, or gallops.  ABDOMEN: Soft, nontender, nondistended.  Bowel sounds present.  EXTREMITIES: No pedal edema, cyanosis, or clubbing.  NEUROLOGIC: Awake not oriented. PSYCHIATRIC: Patient awake not fully oriented  sKIN: No obvious rash, lesion, or ulcer.   LABORATORY PANEL:  Female CBC Recent Labs  Lab 02/26/19 0654  WBC 18.4*  HGB 10.8*  HCT 33.9*  PLT 293   ------------------------------------------------------------------------------------------------------------------ Chemistries  Recent Labs  Lab 02/27/19 0620  NA 134*  K 3.7  CL 94*  CO2 33*  GLUCOSE 133*  BUN 15  CREATININE 0.64  CALCIUM 8.5*  MG 2.2   RADIOLOGY:  No results found. ASSESSMENT AND PLAN:   #Acute metabolic encephalopathy from acute hypoxemic and hypercapnic respiratory failure and , underlying UTI Patient weaned off BiPAP. Also had a UTI Patient mental status is overall stable  #New onset A. fib with RVR-initially with low BP and now hemodynamically stable Rate controlled.  TSH level normal. -RecentTTEcho7/2020 with LVEF 60-65% Continue Lovenox.   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  Oncology Dr. Mike Gip has seen the patient -Patient was on concurrent chemotherapy and immunotherapy prior to this admission -Plan PET scan as outpatient once stable Patient seen by cardiothoracic surgery Pleurx catheter placement Monday  #Acute cystitis urine culture greater than 100,000 colonies of Klebsiella ornithinolytica continue Ancef  #.  Proteus mirabilis bacteremia Ancef till 02/28/2019 Appreciate ID input  #.Type 2 diabetes mellitus -sliding scale  DVT prophylaxis-  patient on Lovenox  All the records are reviewed and case discussed with Care Management/Social Worker. Management plans  discussed with the patient,  family and they are in agreement.  CODE STATUS: DNR  TOTAL TIME TAKING CARE OF THIS PATIENT: 36 minutes.   POSSIBLE D/C IN 2-3 DAYS, DEPENDING ON CLINICAL CONDITION.  Leia Alf Jaelah Hauth M.D on 02/27/2019 at 10:35 AM  Between 7am to 6pm - Pager - [phone]  After 6pm go to [email or web address] - Proofreader  [facility]  Office  [phone]  CC: Primary care physician; Sallee Lange, NP  Note: This dictation was prepared with Dragon dictation along with smaller phrase technology. Any transcriptional errors that result from this process are unintentional.

## 2019-02-27 NOTE — Progress Notes (Signed)
Pharmacy Electrolyte Monitoring Consult:  Pharmacy consulted to assist in monitoring and replacing electrolytes in this 77 y.o. female admitted on 02/18/2019.   Labs:  Sodium (mmol/L)  Date Value  02/27/2019 134 (L)   Potassium (mmol/L)  Date Value  02/27/2019 3.7   Magnesium (mg/dL)  Date Value  02/27/2019 2.2   Phosphorus (mg/dL)  Date Value  02/27/2019 2.1 (L)   Calcium (mg/dL)  Date Value  02/27/2019 8.5 (L)   Albumin (g/dL)  Date Value  02/18/2019 2.9 (L)    Assessment/Plan: -Patient received potassium phosphate 1 tab tid x 3 doses and potassium chloride 68mEq PO x 1.  -Will order potassium phosphate 1 tab x 4 doses and check electrolytes with am labs. -Will replace electrolytes to maintain within normal limits.   Pharmacy will continue to monitor and adjust per consult.   Simpson,Michael L 02/27/2019 8:45 AM

## 2019-02-28 ENCOUNTER — Inpatient Hospital Stay: Payer: Medicare Other | Admitting: Certified Registered"

## 2019-02-28 ENCOUNTER — Encounter
Admission: EM | Disposition: A | Discharge status: Hospice | Payer: Self-pay | Source: Home / Self Care | Attending: Internal Medicine

## 2019-02-28 ENCOUNTER — Encounter: Payer: Self-pay | Admitting: Anesthesiology

## 2019-02-28 ENCOUNTER — Inpatient Hospital Stay: Payer: Medicare Other

## 2019-02-28 HISTORY — PX: CHEST TUBE INSERTION: SHX231

## 2019-02-28 LAB — MRSA PCR SCREENING: MRSA by PCR: NEGATIVE

## 2019-02-28 LAB — BASIC METABOLIC PANEL
Anion gap: 16 — ABNORMAL HIGH (ref 5–15)
BUN: 14 mg/dL (ref 8–23)
CO2: 21 mmol/L — ABNORMAL LOW (ref 22–32)
Calcium: 8.5 mg/dL — ABNORMAL LOW (ref 8.9–10.3)
Chloride: 94 mmol/L — ABNORMAL LOW (ref 98–111)
Creatinine, Ser: 0.52 mg/dL (ref 0.44–1.00)
GFR calc Af Amer: >60 mL/min (ref >60)
GFR calc non Af Amer: >60 mL/min (ref >60)
Glucose, Bld: 129 mg/dL — ABNORMAL HIGH (ref 70–99)
Potassium: 3.9 mmol/L (ref 3.5–5.1)
Sodium: 131 mmol/L — ABNORMAL LOW (ref 135–145)

## 2019-02-28 LAB — GLUCOSE, CAPILLARY
Glucose-Capillary: 140 mg/dL — ABNORMAL HIGH (ref 70–99)
Glucose-Capillary: 175 mg/dL — ABNORMAL HIGH (ref 70–99)
Glucose-Capillary: 234 mg/dL — ABNORMAL HIGH (ref 70–99)

## 2019-02-28 LAB — PROCALCITONIN: Procalcitonin: 0.3 ng/mL

## 2019-02-28 LAB — PHOSPHORUS: Phosphorus: 1.7 mg/dL — ABNORMAL LOW (ref 2.5–4.6)

## 2019-02-28 SURGERY — INSERTION, PLEURAL DRAINAGE CATHETER
Anesthesia: General

## 2019-02-28 MED ORDER — SODIUM CHLORIDE 0.9 % IV BOLUS
500.0000 mL | Freq: Once | INTRAVENOUS | Status: AC
  Administered 2019-02-28: 17:00:00 via INTRAVENOUS

## 2019-02-28 MED ORDER — VANCOMYCIN HCL IN DEXTROSE 1-5 GM/200ML-% IV SOLN
1000.0000 mg | INTRAVENOUS | Status: AC
  Administered 2019-02-28: 1000 mg via INTRAVENOUS
  Filled 2019-02-28: qty 200

## 2019-02-28 MED ORDER — SODIUM CHLORIDE 0.9 % IV SOLN
INTRAVENOUS | Status: DC | PRN
  Administered 2019-02-28: 13:00:00 25 ug/min via INTRAVENOUS

## 2019-02-28 MED ORDER — PHENYLEPHRINE HCL (PRESSORS) 10 MG/ML IV SOLN
INTRAVENOUS | Status: DC | PRN
  Administered 2019-02-28 (×5): 100 ug via INTRAVENOUS

## 2019-02-28 MED ORDER — VANCOMYCIN HCL 1.5 G IV SOLR
1500.0000 mg | Freq: Once | INTRAVENOUS | Status: AC
  Administered 2019-02-28: 1500 mg via INTRAVENOUS
  Filled 2019-02-28: qty 1500

## 2019-02-28 MED ORDER — LIDOCAINE HCL 1 % IJ SOLN
INTRAMUSCULAR | Status: DC | PRN
  Administered 2019-02-28: 23 mL

## 2019-02-28 MED ORDER — FENTANYL CITRATE (PF) 100 MCG/2ML IJ SOLN
INTRAMUSCULAR | Status: DC | PRN
  Administered 2019-02-28: 25 ug via INTRAVENOUS

## 2019-02-28 MED ORDER — SODIUM CHLORIDE 0.9 % IV SOLN
2.0000 g | Freq: Three times a day (TID) | INTRAVENOUS | Status: AC
  Administered 2019-02-28 – 2019-03-02 (×7): 2 g via INTRAVENOUS
  Filled 2019-02-28 (×14): qty 2

## 2019-02-28 MED ORDER — PROPOFOL 500 MG/50ML IV EMUL
INTRAVENOUS | Status: AC
  Filled 2019-02-28: qty 100

## 2019-02-28 MED ORDER — EPHEDRINE SULFATE 50 MG/ML IJ SOLN
INTRAMUSCULAR | Status: DC | PRN
  Administered 2019-02-28 (×2): 10 mg via INTRAVENOUS

## 2019-02-28 MED ORDER — ALBUMIN HUMAN 25 % IV SOLN
25.0000 g | Freq: Once | INTRAVENOUS | Status: AC
  Administered 2019-02-28: 25 g via INTRAVENOUS
  Filled 2019-02-28: qty 100

## 2019-02-28 MED ORDER — FENTANYL CITRATE (PF) 100 MCG/2ML IJ SOLN: 25.0000 ug | INTRAMUSCULAR | Status: DC | PRN

## 2019-02-28 MED ORDER — PROPOFOL 500 MG/50ML IV EMUL
INTRAVENOUS | Status: DC | PRN
  Administered 2019-02-28: 50 ug/kg/min via INTRAVENOUS

## 2019-02-28 MED ORDER — SODIUM PHOSPHATES 45 MMOLE/15ML IV SOLN
30.0000 mmol | Freq: Once | INTRAVENOUS | Status: AC
  Administered 2019-02-28: 22:00:00 30 mmol via INTRAVENOUS
  Filled 2019-02-28: qty 10

## 2019-02-28 MED ORDER — PHENYLEPHRINE HCL-NACL 10-0.9 MG/250ML-% IV SOLN
0.0000 ug/min | INTRAVENOUS | Status: DC
  Filled 2019-02-28: qty 250

## 2019-02-28 MED ORDER — SODIUM CHLORIDE 0.9 % IV SOLN
INTRAVENOUS | Status: DC
  Administered 2019-02-28: 12:00:00 via INTRAVENOUS
  Administered 2019-03-01: 600 mL via INTRAVENOUS

## 2019-02-28 MED ORDER — ENSURE ENLIVE PO LIQD
237.0000 mL | Freq: Two times a day (BID) | ORAL | Status: DC
  Administered 2019-03-01 – 2019-03-02 (×3): 237 mL via ORAL
  Filled 2019-02-28 (×2): qty 237

## 2019-02-28 MED ORDER — POLYETHYLENE GLYCOL 3350 17 G PO PACK: 17.0000 g | PACK | Freq: Every day | ORAL | Status: DC | PRN

## 2019-02-28 MED ORDER — VANCOMYCIN HCL 10 G IV SOLR
1250.0000 mg | INTRAVENOUS | Status: DC
  Administered 2019-03-01: 1250 mg via INTRAVENOUS
  Filled 2019-02-28 (×2): qty 1250

## 2019-02-28 MED ORDER — ENSURE ENLIVE PO LIQD
237.0000 mL | Freq: Two times a day (BID) | ORAL | Status: DC
  Filled 2019-02-28 (×3): qty 237

## 2019-02-28 MED ORDER — LIDOCAINE HCL (PF) 1 % IJ SOLN
INTRAMUSCULAR | Status: AC
  Filled 2019-02-28: qty 30

## 2019-02-28 MED ORDER — LACTATED RINGERS IV SOLN
INTRAVENOUS | Status: DC | PRN
  Administered 2019-02-28: 13:00:00 via INTRAVENOUS

## 2019-02-28 MED ORDER — SODIUM CHLORIDE 0.9 % IV BOLUS: 500.0000 mL | Freq: Once | INTRAVENOUS | Status: DC

## 2019-02-28 MED ORDER — SODIUM CHLORIDE 0.9 % IV SOLN: INTRAVENOUS | Status: DC | PRN

## 2019-02-28 MED ORDER — PROPOFOL 500 MG/50ML IV EMUL
INTRAVENOUS | Status: AC
  Filled 2019-02-28: qty 50

## 2019-02-28 MED ORDER — FENTANYL CITRATE (PF) 100 MCG/2ML IJ SOLN
INTRAMUSCULAR | Status: AC
  Filled 2019-02-28: qty 2

## 2019-02-28 MED ORDER — GLYCOPYRROLATE 0.2 MG/ML IJ SOLN
INTRAMUSCULAR | Status: DC | PRN
  Administered 2019-02-28: 0.2 mg via INTRAVENOUS

## 2019-02-28 SURGICAL SUPPLY — 34 items
BLADE SURG 15 STRL LF DISP TIS (BLADE) ×1 IMPLANT
BLADE SURG 15 STRL SS (BLADE) ×1
CANISTER SUCT 1200ML W/VALVE (MISCELLANEOUS) ×2 IMPLANT
CHLORAPREP W/TINT 26 (MISCELLANEOUS) ×2 IMPLANT
DRAIN CHEST DRY SUCT SGL (MISCELLANEOUS) ×2 IMPLANT
DRAPE INCISE IOBAN 66X45 STRL (DRAPES) ×2 IMPLANT
DRAPE LAPAROTOMY 77X122 PED (DRAPES) ×2 IMPLANT
ELECT REM PT RETURN 9FT ADLT (ELECTROSURGICAL) ×2
ELECTRODE REM PT RTRN 9FT ADLT (ELECTROSURGICAL) ×1 IMPLANT
GLOVE SURG SYN 7.5  E (GLOVE) ×1
GLOVE SURG SYN 7.5 E (GLOVE) ×1 IMPLANT
GLOVE SURG SYN 7.5 PF PI (GLOVE) ×1 IMPLANT
GOWN STRL REUS W/ TWL LRG LVL3 (GOWN DISPOSABLE) ×2 IMPLANT
GOWN STRL REUS W/TWL LRG LVL3 (GOWN DISPOSABLE) ×2
KIT PLEURX DRAIN CATH 500ML (KITS) ×2 IMPLANT
KIT TURNOVER KIT A (KITS) ×2 IMPLANT
LABEL OR SOLS (LABEL) ×2 IMPLANT
MARKER SKIN DUAL TIP RULER LAB (MISCELLANEOUS) ×2 IMPLANT
PACK BASIN MINOR ARMC (MISCELLANEOUS) ×2 IMPLANT
SUCTION FRAZIER HANDLE 10FR (MISCELLANEOUS) ×1
SUCTION TUBE FRAZIER 10FR DISP (MISCELLANEOUS) ×1 IMPLANT
SUT ETH BLK MONO 3 0 FS 1 12/B (SUTURE) ×4 IMPLANT
SUT ETHILON 4-0 (SUTURE) ×1
SUT ETHILON 4-0 FS2 18XMFL BLK (SUTURE) ×1
SUT SILK 0 (SUTURE) ×1
SUT SILK 0 30XBRD TIE 6 (SUTURE) ×1 IMPLANT
SUT SILK 1 SH (SUTURE) ×2 IMPLANT
SUT VIC AB 0 SH 27 (SUTURE) ×2 IMPLANT
SUT VIC AB 2-0 SH 27 (SUTURE) ×2
SUT VIC AB 2-0 SH 27XBRD (SUTURE) ×1 IMPLANT
SUT VIC AB 3-0 SH 27 (SUTURE) ×1
SUT VIC AB 3-0 SH 27X BRD (SUTURE) ×1 IMPLANT
SUTURE ETHLN 4-0 FS2 18XMF BLK (SUTURE) ×1 IMPLANT
WATER STERILE IRR 1000ML POUR (IV SOLUTION) ×2 IMPLANT

## 2019-02-28 NOTE — Progress Notes (Signed)
Dr Marcello Moores aware of blood pressure of 90's to upper 80's   Phenylephrine off per dr Marcello Moores require   Pt receiving more fluids

## 2019-02-28 NOTE — Progress Notes (Addendum)
Initial Nutrition Assessment  DOCUMENTATION CODES:   Obesity unspecified  INTERVENTION:  Ensure Enlive po BID, each supplement provides 350 kcal and 20 grams of protein  MVI daily    Liberalize diet   NUTRITION DIAGNOSIS:   Increased nutrient needs related to cancer and cancer related treatments as evidenced by increased estimated needs.  GOAL:   Patient will meet greater than or equal to 90% of their needs  MONITOR:   PO intake, Supplement acceptance, Labs, Weight trends, Skin, I & O's  REASON FOR ASSESSMENT:   LOS    ASSESSMENT:   78 y/o female with lung cancer s/p Pleurx catheter placement, bacteremia, UTI, AMS, Afib, SIADH and DM.  RD working remotely.  Unable to speak with pt today as pt in procedure at time of RD visit. Pt documented to be eating 70-100% of meals in hospital. Pt with increased estimated needs r/t lung cancer. RD will add supplements to help pt meet her estimated needs. RD will also liberalize the heart healthy portion of pt's diet as this is restrictive of protein. Per chart, pt appears fairly weight stable pta. Pt down 3lbs since admit.   Medications reviewed and include: oscal w/ D, ferrous sulfate, MVI, protonix, vitamin C, cefazolin, Na phosphate   Labs reviewed: Na 131(L), Ca 8.5(L), P 1.7(L), Mg 2.2 wnl Wbc- 18.4(H), Hgb 10.8(L), Hct 33.9(L), MCV 76.4(L), MCH 24.3(L)  Unable to complete Nutrition-Focused physical exam at this time.   Diet Order:   Diet Order            Diet NPO time specified  Diet effective midnight        Diet NPO time specified  Diet effective midnight        Diet Carb Modified Fluid consistency: Thin; Room service appropriate? Yes  Diet effective now             EDUCATION NEEDS:   Not appropriate for education at this time  Skin:  Skin Assessment: Reviewed RN Assessment(ecchymosis)  Last BM:  8/17- type 7  Height:   Ht Readings from Last 1 Encounters:  02/28/19 5\' 4"  (1.626 m)    Weight:   Wt  Readings from Last 1 Encounters:  02/28/19 101.3 kg    Ideal Body Weight:  54.5 kg  BMI:  Body mass index is 38.33 kg/m.  Estimated Nutritional Needs:   Kcal:  1700-2000kcal/day  Protein:  85-100g/day  Fluid:  >1.6L/day  Koleen Distance MS, RD, LDN Pager #- [phone] Office#- [phone] After Hours Pager: [phone]

## 2019-02-28 NOTE — Progress Notes (Addendum)
I was called to evaluate this patient in the PACU after placement of her pleurex catheter today. She has been persistently hypotensive despite 2L NS bolus and multiple pushes of Neo. MAPs have stayed in the 62s. Will transfer to the stepdown unit for overnight monitoring. Repeat blood cultures have been ordered. Discussed with Dr. Delaine Lame and I have broadened her abx to vancomycin and cefepime.  Hyman Bible, MD

## 2019-02-28 NOTE — Interval H&P Note (Signed)
History and Physical Interval Note:  02/28/2019 12:22 PM  Cathy Buck  has presented today for surgery, with the diagnosis of N/A.  The various methods of treatment have been discussed with the patient and family. After consideration of risks, benefits and other options for treatment, the patient has consented to  Procedure(s): INSERTION PLEURAL DRAINAGE CATHETER (N/A) as a surgical intervention.  The patient's history has been reviewed, patient examined, no change in status, stable for surgery.  I have reviewed the patient's chart and labs.  Questions were answered to the patient's satisfaction.     [name]

## 2019-02-28 NOTE — Care Management Important Message (Signed)
Important Message  Patient Details  Name: Cathy Buck MRN: [id number] Date of Birth: 12/25/1940   Medicare Important Message Given:  Yes     Dannette Barbara 02/28/2019, 12:16 PM

## 2019-02-28 NOTE — Anesthesia Preprocedure Evaluation (Addendum)
Anesthesia Evaluation  Patient identified by MRN, date of birth, ID band Patient awake    Reviewed: Allergy & Precautions, H&P , NPO status , Patient's Chart, lab work & pertinent test results, reviewed documented beta blocker date and time   Airway Mallampati: III  TM Distance: >3 FB     Dental  (+) Missing Two teeth:   Pulmonary neg COPD, former smoker,  Lung CA Recurrent malignant effusions.  Brea 3L. Presenting for pleural drainage catheter placement          Cardiovascular hypertension, Pt. on medications (-) angina(-) Past MI and (-) Cardiac Stents (-) dysrhythmias      Neuro/Psych negative neurological ROS  negative psych ROS   GI/Hepatic negative GI ROS, Neg liver ROS,   Endo/Other  diabetes, Type 2Morbid obesity  Renal/GU      Musculoskeletal   Abdominal   Peds  Hematology  (+) Blood dyscrasia, anemia ,   Anesthesia Other Findings Past Medical History: No date: Diabetes mellitus without complication ([place]) No date: Hypertension  Past Surgical History: No date: ABDOMINAL HYSTERECTOMY No date: CHOLECYSTECTOMY OBESE.  BMI    Body Mass Index: 38.33 kg/m      Reproductive/Obstetrics negative OB ROS                         Anesthesia Physical Anesthesia Plan  ASA: III  Anesthesia Plan: General   Post-op Pain Management:    Induction: Intravenous  PONV Risk Score and Plan: Propofol infusion and TIVA  Airway Management Planned: Simple Face Mask and Natural Airway  Additional Equipment:   Intra-op Plan:   Post-operative Plan:   Informed Consent: I have reviewed the patients History and Physical, chart, labs and discussed the procedure including the risks, benefits and alternatives for the proposed anesthesia with the patient or authorized representative who has indicated his/her understanding and acceptance.     Dental Advisory Given  Plan Discussed with:  Anesthesiologist and CRNA  Anesthesia Plan Comments:       Anesthesia Quick Evaluation

## 2019-02-28 NOTE — Progress Notes (Signed)
PT Cancellation Note  Patient Details Name: Louetta Hollingshead Fiorillo MRN: [id number] DOB: [birth year]   Cancelled Treatment:    Reason Eval/Treat Not Completed: Patient not medically ready.  Per documentation, pt underwent insertion of Pleurx catheter drain (R side) today and is now being treated for hypotension.  Will re-attempt when pt is medically ready.  Roxanne Gates, PT, DPT  Roxanne Gates 02/28/2019, 5:33 PM

## 2019-02-28 NOTE — Progress Notes (Signed)
Dr Marcello Moores states to stop phenyleprine drip and slow bolus infusion down   Blood pressure at present 98/52

## 2019-02-28 NOTE — Anesthesia Post-op Follow-up Note (Signed)
Anesthesia QCDR form completed.        

## 2019-02-28 NOTE — Progress Notes (Signed)
Pt daughter called for update but this nurse unable to take her call. On call back, daughter sis not answer the phone and no voicemail was available to leave a message. Pt made aware. Pt has no c/o pain and no concerns offered.

## 2019-02-28 NOTE — Progress Notes (Signed)
Receiving fluids for blood pressure   Dr piscitello in to check pt several times

## 2019-02-28 NOTE — Progress Notes (Signed)
Pharmacy Electrolyte Monitoring Consult:  Pharmacy consulted to assist in monitoring and replacing electrolytes in this 78 y.o. female admitted on 02/18/2019.   Labs:  Sodium (mmol/L)  Date Value  02/28/2019 131 (L)   Potassium (mmol/L)  Date Value  02/28/2019 3.9   Magnesium (mg/dL)  Date Value  02/27/2019 2.2   Phosphorus (mg/dL)  Date Value  02/28/2019 1.7 (L)   Calcium (mg/dL)  Date Value  02/28/2019 8.5 (L)   Albumin (g/dL)  Date Value  02/18/2019 2.9 (L)    Assessment/Plan: K 3.9  Phos 1.7  Scr 0.52  Na 131 -Will order sodium phosphate 30 mmol IV x1, as it appears oral Phosphate supplementation is not adequate at current time. F/u electrolytes in am   Pharmacy will continue to monitor and adjust per consult.   Mikala Podoll A 02/28/2019 12:22 PM

## 2019-02-28 NOTE — Transfer of Care (Signed)
Immediate Anesthesia Transfer of Care Note  Patient: Cathy Buck  Procedure(s) Performed: INSERTION PLEURAL DRAINAGE CATHETER (N/A )  Patient Location: PACU  Anesthesia Type:MAC  Level of Consciousness: awake, alert  and patient cooperative  Airway & Oxygen Therapy: Patient Spontanous Breathing and Patient connected to face mask oxygen  Post-op Assessment: Report given to RN and Post -op Vital signs reviewed and stable  Post vital signs: Reviewed and stable  Last Vitals:  Vitals Value Taken Time  BP 91/47 02/28/19 1419  Temp 37.1 C 02/28/19 1412  Pulse 92 02/28/19 1419  Resp 21 02/28/19 1419  SpO2 99 % 02/28/19 1419  Vitals shown include unvalidated device data.  Last Pain:  Vitals:   02/28/19 1412  TempSrc:   PainSc: 0-No pain      Patients Stated Pain Goal: 0 (12/45/80 9983)  Complications: No apparent anesthesia complications

## 2019-02-28 NOTE — Progress Notes (Signed)
Pharmacy Antibiotic Note  Cathy Buck is a 78 y.o. female admitted on 02/18/2019 with sepsis.  Pharmacy has been consulted for Vancomycin, Cefepime dosing.  Plan: Cefepime 2 gm IV Q8H ordered to start on 8/17 @ 1900.  Vancomycin 1 gm IV X 1 given on 8/17 @ 1200. Vancomycin 1500 mg IV X 1 given on 8/17 @ 1921 to make total loading dose of 2500 mg. Vancomycin 1250 mg IV Q24H ordered to start on 8/18 @ 1200. No peak or trough currently ordered.   CrCl = 50 ml/min  Ke = 0.046 hr-1 T1/2 = 15.1 hrs Vd = 50.7 L  AUC = 537 C min = 13.1   Height: 5\' 4"  (162.6 cm) Weight: 223 lb 5.2 oz (101.3 kg) IBW/kg (Calculated) : 54.7  Temp (24hrs), Avg:98.4 F (36.9 C), Min:97.3 F (36.3 C), Max:98.9 F (37.2 C)  Recent Labs  Lab 02/22/19 0501  02/24/19 0455 02/25/19 0059 02/26/19 0654 02/27/19 0620 02/28/19 0847  WBC 20.1*  --  27.0* 28.2* 18.4*  --   --   CREATININE 0.50   < > 0.54 0.64 0.55 0.64 0.52   < > = values in this interval not displayed.    Estimated Creatinine Clearance: 67.1 mL/min (by C-G formula based on SCr of 0.52 mg/dL).    Allergies  Allergen Reactions  . Codeine Hives  . Atorvastatin Other (See Comments)  . Citalopram Other (See Comments)    Nausea  . Keflex [Cephalexin] Diarrhea  . Tylenol [Acetaminophen] Hives    Antimicrobials this admission:   >>    >>   Dose adjustments this admission:   Microbiology results:  BCx:   UCx:    Sputum:    MRSA PCR:   Thank you for allowing pharmacy to be a part of this patient's care.  Debanhi Blaker D 02/28/2019 7:41 PM

## 2019-02-28 NOTE — Progress Notes (Signed)
[place] at [place] NAME: Cathy Buck    MR#:  [id number]  DATE OF BIRTH:  28-Feb-1941  SUBJECTIVE:  CHIEF COMPLAINT:   Chief Complaint  Patient presents with  . Altered Mental Status   Nothing acute overnight. Waiting for her Pleurx cath placement  REVIEW OF SYSTEMS:  Review of Systems  Constitutional: Negative for chills and fever.  HENT: Negative for hearing loss and tinnitus.   Eyes: Negative for blurred vision and double vision.  Respiratory: Negative for cough and shortness of breath.   Cardiovascular: Negative for chest pain and palpitations.  Gastrointestinal: Negative for abdominal pain, heartburn, nausea and vomiting.  Genitourinary: Negative for dysuria and urgency.  Musculoskeletal: Negative for myalgias.  Skin: Negative for itching and rash.  Neurological: Negative for dizziness and headaches.  Psychiatric/Behavioral: Negative for depression and substance abuse.    DRUG ALLERGIES:   Allergies  Allergen Reactions  . Codeine Hives  . Atorvastatin Other (See Comments)  . Citalopram Other (See Comments)    Nausea  . Keflex [Cephalexin] Diarrhea  . Tylenol [Acetaminophen] Hives   VITALS:  Blood pressure 119/90, pulse 83, temperature 98.8 F (37.1 C), temperature source Tympanic, resp. rate 19, height 5\' 4"  (1.626 m), weight 101.3 kg, SpO2 96 %. PHYSICAL EXAMINATION:  Physical Exam  GENERAL:  78 y.o.-year-old patient lying in the bed with no acute distress.  EYES: Pupils equal, round, reactive to light and accommodation. No scleral icterus. Extraocular muscles intact.  HEENT: Head atraumatic, normocephalic. Oropharynx and nasopharynx clear.  NECK:  Supple, no jugular venous distention. No thyroid enlargement, no tenderness.  LUNGS: Moderately diminished breath sounds bilaterally, no wheezing, rales,rhonchi or crepitation. No use of accessory muscles of respiration.  CARDIOVASCULAR: S1, S2 normal. No murmurs, rubs,  or gallops.  ABDOMEN: Soft, nontender, nondistended. Bowel sounds present.  EXTREMITIES: No pedal edema, cyanosis, or clubbing.  NEUROLOGIC: Awake not oriented. PSYCHIATRIC: Patient awake not fully oriented  sKIN: No obvious rash, lesion, or ulcer.   LABORATORY PANEL:  Female CBC Recent Labs  Lab 02/26/19 0654  WBC 18.4*  HGB 10.8*  HCT 33.9*  PLT 293   ------------------------------------------------------------------------------------------------------------------ Chemistries  Recent Labs  Lab 02/27/19 0620 02/28/19 0847  NA 134* 131*  K 3.7 3.9  CL 94* 94*  CO2 33* 21*  GLUCOSE 133* 129*  BUN 15 14  CREATININE 0.64 0.52  CALCIUM 8.5* 8.5*  MG 2.2  --    RADIOLOGY:  No results found. ASSESSMENT AND PLAN:   #Acute metabolic encephalopathy from acute hypoxemic and hypercapnic respiratory failure and , underlying UTI Patient weaned off BiPAP. Also had a UTI Patient mental status is overall stable  #New onset A. fib with RVR-initially with low BP and now hemodynamically stable Rate controlled.  TSH level normal. -RecentTTEcho7/2020 with LVEF 60-65% Continue Lovenox.   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  Oncology Dr. Mike Gip has seen the patient -Patient was on concurrent chemotherapy and immunotherapy prior to this admission -Plan PET scan as outpatient once stable Patient seen by cardiothoracic surgery Pleurx catheter placement today  #Acute cystitis urine culture greater than 100,000 colonies of Klebsiella ornithinolytica On Ancef  #.  Proteus mirabilis bacteremia Ancef till 02/28/2019 Appreciate ID input  #.Type 2 diabetes mellitus -sliding scale  DVT prophylaxis-  patient on  Lovenox  All the records are reviewed and case discussed with Care Management/Social Worker. Management plans discussed with the patient, family and they are in agreement.  CODE STATUS: DNR  TOTAL TIME TAKING CARE OF THIS PATIENT: 36 minutes.   POSSIBLE D/C IN 2-3 DAYS, DEPENDING ON CLINICAL CONDITION.  [name] M.D on 02/28/2019 at 1:49 PM  Between 7am to 6pm - Pager - [phone]  After 6pm go to [email or web address] - Proofreader  [facility]  Office  [phone]  CC: Primary care physician; Sallee Lange, NP  Note: This dictation was prepared with Dragon dictation along with smaller phrase technology. Any transcriptional errors that result from this process are unintentional.

## 2019-02-28 NOTE — Op Note (Signed)
  02/28/2019  2:25 PM  PATIENT:  Cathy Buck  78 y.o. female  PRE-OPERATIVE DIAGNOSIS: Malignant pleural effusion right side  POST-OPERATIVE DIAGNOSIS: Same  PROCEDURE: Insertion of right-sided Pleurx catheter drain  SURGEON:  Surgeon(s) and Role:    [name], MD - Primary  ASSISTANTS: Loree Fee, PA  ANESTHESIA: Local with intravenous sedation  INDICATIONS FOR PROCEDURE recurrent malignant pleural effusion  DICTATION: This patient is a 78 year old woman who has undergone multiple thoracentesis over the last month for recurrent pleural effusion.  Work-up is confirmed presence of adenocarcinoma of and she was offered insertion of a Pleurx catheter for definitive management.  The indications and risks were explained to the patient and to her family gave their informed consent.  The patient was brought to the operating suite and placed in the supine position.  Patient was given some intravenous sedation she was then turned for insertion of a right-sided Pleurx catheter.  Patient was secured to the bed and the patient was prepped and draped in usual sterile fashion.  We began by making a small incision at approximately the sixth intercostal space.  This was made anterior to the scapula.  The incision was deepened down through the muscles of the chest wall until the intercostal muscles were seen.  The catheter was then inserted through a separate stab wound anteriorly and brought out lateral and inferior to the breast.  Once the catheter was tunneled to the incision site the pleura was opened and the catheter was inserted under direct visualization.  This was done because there is minimal pleural fluid present on the x-ray.  Once the catheter was secured to the skin with a silk suture it was then placed to a Pleur-evac with 20 cm of water suction.  The wounds were again irrigated and closed.  #3 Vicryl was used to approximate subcutaneous tissues and 4-0 Monocryl for the skin.  Dermabond  was applied and sterile dressings were applied as well.  Patient was then turned and taken to the recovery room in stable condition.   [name], MD

## 2019-02-28 NOTE — Consult Note (Signed)
Name: Cathy Buck MRN: [id number] DOB: [birth year]    ADMISSION DATE:  02/18/2019 CONSULTATION DATE: 02/28/2019  REFERRING MD : Dr. Brett Albino   CHIEF COMPLAINT: Hypotension   BRIEF PATIENT DESCRIPTION:  78 yo female with recurrent malignant right sided pleural effusion secondary to newly diagnosed stage IV lung adenocarcinoma s/p right sided pleurx catheter placement on 08/17 pt hypotensive secondary to hypovolemia postop requiring transfer to the stepdown unit   SIGNIFICANT EVENTS/STUDIES:  08/7-Pt admitted to telemetry unit with altered mental status and new onset atrial fibrillation with rvr  08/7-CT Head negative  08/7-MR Brain revealed no acute abnormality. Minimal white matter disease which appears chronic and unchanged from prior MRI 08/10-Pt transferred to the stepdown unit with acute respiratory failure requiring Bipap 08/10-Respiratory status stabilized and pt transferred to the medsurg unit  08/10-Blood cultures positive for proteus and urine culture revealed klebsiella ID consulted recommended ruling out empyema of right lung and ceftriaxone continued  08/10-Right sided thoracentesis yielded 800 ml of bloody pleural fluid  08/10-Palliative care consulted to assist with goals of treatment  08/12-Mentation improved pt communicative; ceftriaxone discontinued and cefazolin started 08/12-Cardiothoracic surgeon consulted for right sided pleurx catheter placement 08/17-Pt underwent insertion of right sided pleurx catheter drain due to malignant right sided pleural effusion.  Required transfer to the stepdown unit due to hypotension secondary to hypovolemia   HISTORY OF PRESENT ILLNESS:   This is a 78 yo female with a PMH of HTN and Diabetes Mellitus.  She presented to [facility] ER on 08/7 from the cancer center with altered mental status, speech abnormality, lethargy, and hypotension.  She was newly diagnosed with adenocarcinoma of the lung in July 2020.  In the ER CXR revealed progressive  right base atelectasis/infiltrate and right sided pleural effusion she was started on iv ceftriaxone.  EKG revealed new onset atrial fibrillation with rvr requiring iv amiodarone.  She was subsequently admitted to the telemetry unit per hospitalist team for additional workup and treatment.  Detailed hospital course listed above under significant events.   PAST MEDICAL HISTORY :   has a past medical history of Diabetes mellitus without complication ([place]) and Hypertension.  has a past surgical history that includes Abdominal hysterectomy and Cholecystectomy. Prior to Admission medications   Medication Sig Start Date End Date Taking? Authorizing Provider  albuterol (VENTOLIN HFA) 108 (90 Base) MCG/ACT inhaler Inhale 1 puff into the lungs every 6 (six) hours as needed for wheezing or shortness of breath.   Yes [provider]  Amino Acids-Protein Hydrolys (FEEDING SUPPLEMENT, PRO-STAT SUGAR FREE 64,) LIQD Take 30 mLs by mouth daily.   Yes [provider]  atenolol (TENORMIN) 50 MG tablet Take 50 mg by mouth daily. 11/22/18  Yes [provider]  Calcium Carb-Cholecalciferol (CALCIUM-VITAMIN D) 500-200 MG-UNIT tablet Take 1 tablet by mouth 2 (two) times a day.   Yes [provider]  Multiple Vitamin (MULTIVITAMIN WITH MINERALS) TABS tablet Take 1 tablet by mouth daily.   Yes [provider]  omeprazole (PRILOSEC) 40 MG capsule Take 40 mg by mouth daily. 30 minutes before food and meds 01/18/19 01/18/20 Yes [provider]  polyethylene glycol (MIRALAX / GLYCOLAX) 17 g packet Take 17 g by mouth daily. 02/15/19  Yes Wieting, Richard, MD  vitamin C (ASCORBIC ACID) 250 MG tablet Take 250 mg by mouth 2 (two) times daily.   Yes [provider]   Allergies  Allergen Reactions  . Codeine Hives  . Atorvastatin Other (See Comments)  .  Citalopram Other (See Comments)    Nausea  . Keflex [Cephalexin] Diarrhea  . Tylenol [Acetaminophen] Hives    FAMILY  HISTORY:  family history is not on file. SOCIAL HISTORY:  reports that she has quit smoking. She has never used smokeless tobacco. She reports previous alcohol use. She reports previous drug use.  REVIEW OF SYSTEMS: Positives in BOLD  Constitutional: Negative for fever, chills, weight loss, malaise/fatigue and diaphoresis.  HENT: Negative for hearing loss, ear pain, nosebleeds, congestion, sore throat, neck pain, tinnitus and ear discharge.   Eyes: Negative for blurred vision, double vision, photophobia, pain, discharge and redness.  Respiratory: Negative for cough, hemoptysis, sputum production, shortness of breath, wheezing and stridor.   Cardiovascular: Negative for chest pain, palpitations, orthopnea, claudication, leg swelling and PND.  Gastrointestinal: Negative for heartburn, nausea, vomiting, abdominal pain, diarrhea, constipation, blood in stool and melena.  Genitourinary: Negative for dysuria, urgency, frequency, hematuria and flank pain.  Musculoskeletal: Negative for myalgias, back pain, joint pain and falls.  Skin: Negative for itching and rash.  Neurological: Negative for dizziness, tingling, tremors, sensory change, speech change, focal weakness, seizures, loss of consciousness, weakness and headaches.  Endo/Heme/Allergies: Negative for environmental allergies and polydipsia. Does not bruise/bleed easily.  SUBJECTIVE:  No complaints at this time   VITAL SIGNS: Temp:  [97.3 F (36.3 C)-98.9 F (37.2 C)] 98.2 F (36.8 C) (08/17 2130) Pulse Rate:  [73-98] 79 (08/17 2130) Resp:  [13-27] 21 (08/17 2130) BP: (76-135)/(40-90) 125/63 (08/17 2200) SpO2:  [88 %-100 %] 97 % (08/17 2130) Weight:  [101.3 kg] 101.3 kg (08/17 1052)  PHYSICAL EXAMINATION: General: well developed, well nourished female, NAD  Neuro: alert and oriented, follows commands  HEENT: supple, no JVD  Cardiovascular: nsr, rrr, no R/G  Lungs: diminished on the right, clear all other lobes, even, non labored,  right lateral side pleurx catheter in place with serosanguinous drainage  Abdomen: +BS x4, soft, obese, non tender, non distended  Musculoskeletal: normal tone, trace bilateral lower extremity edema  Skin: right sixth intercostal chest tube incision site dry and intact   Recent Labs  Lab 02/26/19 0654 02/27/19 0620 02/28/19 0847  NA 136 134* 131*  K 3.2* 3.7 3.9  CL 95* 94* 94*  CO2 32 33* 21*  BUN 18 15 14   CREATININE 0.55 0.64 0.52  GLUCOSE 164* 133* 129*   Recent Labs  Lab 02/24/19 0455 02/25/19 0059 02/26/19 0654  HGB 10.2* 10.8* 10.8*  HCT 32.0* 33.8* 33.9*  WBC 27.0* 28.2* 18.4*  PLT 254 302 293   Dg Chest Port 1 View  Result Date: 02/28/2019 CLINICAL DATA:  Chest tube placement EXAM: PORTABLE CHEST 1 VIEW COMPARISON:  Chest x-ray dated 02/21/2019. FINDINGS: RIGHT-sided chest tube in place with tip directed towards the medial aspects of the RIGHT upper lung. Stable opacities at the RIGHT lung base, likely residual atelectasis. Probable mild atelectasis at the LEFT lung base. No pneumothorax seen. Heart size and mediastinal contours appear stable. IMPRESSION: RIGHT-sided chest tube in place with tip directed towards the medial aspects of the RIGHT upper lung. No pneumothorax seen. Probable residual atelectasis at the RIGHT lung base. Electronically Signed   By: Franki Cabot M.D.   On: 02/28/2019 15:21    ASSESSMENT / PLAN:  Acute on chronic respiratory failure secondary to recurrent right sided malignant pleural effusion in setting newly diagnosed stage IV lung adenocarcinoma s/p right pleurx catheter insertion 08/17  Supplemental O2 for dyspnea and/or hypoxia  Bipap qhs Continue scheduled and prn  bronchodilator therapy  Continue po prednisone  Right sided pleur-evac to 20 cm water suction  Oncology, Cardiothoracic Surgery, and Palliative Care consulted appreciate input   Hypotension likely secondary to hypovolemia s/p right pleurx catheter insertion 08/17 Atrial  fibrillation  Continuous telemetry monitoring  IV fluid resuscitation and iv albumin as needed to maintain map >65  Continue po amiodarone  Trend WBC and monitor fever curve  Follow cultures  Continue vancomycin and cefepime; ID consulted appreciate input  Will r/o sepsis   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

## 2019-02-28 NOTE — Progress Notes (Signed)
Patients daughter updated. Daughter gave RN patients glasses. Daughter will call later for additional info once the patient is transferred to ICU.

## 2019-02-28 NOTE — Progress Notes (Signed)
Dr Marcello Moores into see pt   Started phenylephrine drip  Regulated by dr Marcello Moores  For low blood pressure  Pt receiving bolus of fluids   Alert and awake

## 2019-02-28 NOTE — Progress Notes (Signed)
PT Cancellation Note  Patient Details Name: Cathy Buck MRN: [id number] DOB: 1941-07-04   Cancelled Treatment:    Reason Eval/Treat Not Completed: Patient at procedure or test/unavailable.  Pt currently not in room.  Will check back after lunch.  Roxanne Gates, PT, DPT  Roxanne Gates 02/28/2019, 11:22 AM

## 2019-02-28 NOTE — Progress Notes (Signed)
Dr Genevive Bi in to see pt pt  Bladder scan 620  Foley inserted   Golden urine out  approx 500 ml at present     Pt blood pressure up and down after 2000 ml of fluid  Dr mody to see pt  Pt doing well   Daughter was called by dr Genevive Bi    Chest tube drainage 8oo ml of light pink drainage

## 2019-03-01 ENCOUNTER — Encounter: Payer: Self-pay | Admitting: Cardiothoracic Surgery

## 2019-03-01 ENCOUNTER — Inpatient Hospital Stay: Payer: Medicare Other

## 2019-03-01 DIAGNOSIS — I959 Hypotension, unspecified: Secondary | ICD-10-CM

## 2019-03-01 DIAGNOSIS — K5900 Constipation, unspecified: Secondary | ICD-10-CM

## 2019-03-01 DIAGNOSIS — Z978 Presence of other specified devices: Secondary | ICD-10-CM

## 2019-03-01 DIAGNOSIS — C799 Secondary malignant neoplasm of unspecified site: Secondary | ICD-10-CM

## 2019-03-01 LAB — TYPE AND SCREEN
ABO/RH(D): O POS
Antibody Screen: NEGATIVE
Unit division: 0
Unit division: 0

## 2019-03-01 LAB — CBC WITH DIFFERENTIAL/PLATELET
Abs Immature Granulocytes: 0.57 10*3/uL — ABNORMAL HIGH (ref 0.00–0.07)
Basophils Absolute: 0.1 10*3/uL (ref 0.0–0.1)
Basophils Relative: 1 %
Eosinophils Absolute: 0 10*3/uL (ref 0.0–0.5)
Eosinophils Relative: 0 %
HCT: 29.3 % — ABNORMAL LOW (ref 36.0–46.0)
Hemoglobin: 9.4 g/dL — ABNORMAL LOW (ref 12.0–15.0)
Immature Granulocytes: 3 %
Lymphocytes Relative: 3 %
Lymphs Abs: 0.5 10*3/uL — ABNORMAL LOW (ref 0.7–4.0)
MCH: 24.8 pg — ABNORMAL LOW (ref 26.0–34.0)
MCHC: 32.1 g/dL (ref 30.0–36.0)
MCV: 77.3 fL — ABNORMAL LOW (ref 80.0–100.0)
Monocytes Absolute: 0.6 10*3/uL (ref 0.1–1.0)
Monocytes Relative: 4 %
Neutro Abs: 15.7 10*3/uL — ABNORMAL HIGH (ref 1.7–7.7)
Neutrophils Relative %: 89 %
Platelets: 324 10*3/uL (ref 150–400)
RBC: 3.79 MIL/uL — ABNORMAL LOW (ref 3.87–5.11)
RDW: 17.7 % — ABNORMAL HIGH (ref 11.5–15.5)
WBC: 17.6 10*3/uL — ABNORMAL HIGH (ref 4.0–10.5)
nRBC: 0 % (ref 0.0–0.2)

## 2019-03-01 LAB — BASIC METABOLIC PANEL
Anion gap: 5 (ref 5–15)
BUN: 13 mg/dL (ref 8–23)
CO2: 30 mmol/L (ref 22–32)
Calcium: 8 mg/dL — ABNORMAL LOW (ref 8.9–10.3)
Chloride: 97 mmol/L — ABNORMAL LOW (ref 98–111)
Creatinine, Ser: 0.42 mg/dL — ABNORMAL LOW (ref 0.44–1.00)
GFR calc Af Amer: >60 mL/min (ref >60)
GFR calc non Af Amer: >60 mL/min (ref >60)
Glucose, Bld: 253 mg/dL — ABNORMAL HIGH (ref 70–99)
Potassium: 3.7 mmol/L (ref 3.5–5.1)
Sodium: 132 mmol/L — ABNORMAL LOW (ref 135–145)

## 2019-03-01 LAB — BPAM RBC
Blood Product Expiration Date: 202009012359
Blood Product Expiration Date: 202009052359
ISSUE DATE / TIME: 202008052206
Unit Type and Rh: 5100
Unit Type and Rh: 5100

## 2019-03-01 LAB — PHOSPHORUS: Phosphorus: 3.6 mg/dL (ref 2.5–4.6)

## 2019-03-01 LAB — GLUCOSE, CAPILLARY
Glucose-Capillary: 205 mg/dL — ABNORMAL HIGH (ref 70–99)
Glucose-Capillary: 232 mg/dL — ABNORMAL HIGH (ref 70–99)
Glucose-Capillary: 202 mg/dL — ABNORMAL HIGH (ref 70–99)

## 2019-03-01 LAB — HEMOGLOBIN A1C
Hgb A1c MFr Bld: 6.9 % — ABNORMAL HIGH (ref 4.8–5.6)
Mean Plasma Glucose: 151.33 mg/dL

## 2019-03-01 LAB — PREPARE RBC (CROSSMATCH)

## 2019-03-01 MED ORDER — FLUTICASONE FUROATE-VILANTEROL 100-25 MCG/INH IN AEPB
1.0000 | INHALATION_SPRAY | Freq: Every day | RESPIRATORY_TRACT | Status: DC
  Administered 2019-03-01 – 2019-03-06 (×6): 1 via RESPIRATORY_TRACT
  Filled 2019-03-01 (×4): qty 28

## 2019-03-01 MED ORDER — CHLORHEXIDINE GLUCONATE CLOTH 2 % EX PADS: 6.0000 | MEDICATED_PAD | Freq: Every day | CUTANEOUS | Status: DC

## 2019-03-01 MED ORDER — MORPHINE SULFATE (PF) 2 MG/ML IV SOLN: 2.0000 mg | INTRAVENOUS | Status: DC | PRN

## 2019-03-01 MED ORDER — IPRATROPIUM-ALBUTEROL 0.5-2.5 (3) MG/3ML IN SOLN: 3.0000 mL | Freq: Four times a day (QID) | RESPIRATORY_TRACT | Status: DC | PRN

## 2019-03-01 MED ORDER — APIXABAN 5 MG PO TABS
5.0000 mg | ORAL_TABLET | Freq: Two times a day (BID) | ORAL | Status: DC
  Administered 2019-03-01 – 2019-03-09 (×17): 5 mg via ORAL
  Filled 2019-03-01 (×17): qty 1

## 2019-03-01 MED ORDER — INSULIN ASPART 100 UNIT/ML ~~LOC~~ SOLN
0.0000 [IU] | Freq: Three times a day (TID) | SUBCUTANEOUS | Status: DC
  Administered 2019-03-01 (×2): 3 [IU] via SUBCUTANEOUS
  Administered 2019-03-02: 17:00:00 2 [IU] via SUBCUTANEOUS
  Administered 2019-03-02: 1 [IU] via SUBCUTANEOUS
  Administered 2019-03-03 – 2019-03-04 (×2): 2 [IU] via SUBCUTANEOUS
  Administered 2019-03-04: 1 [IU] via SUBCUTANEOUS
  Administered 2019-03-05 – 2019-03-06 (×5): 2 [IU] via SUBCUTANEOUS
  Filled 2019-03-01 (×12): qty 1

## 2019-03-01 NOTE — Progress Notes (Signed)
ID Patient had Pleurx catheter placed yesterday. Following which she became hypotensive.  She was in stepdown ICU Her antibiotics were escalated to vancomycin and cefepime and blood cultures were sent. Today she is out of the ICU. She is doing well. She says she is constipated and trying to have a bowel movement She does not have any chest pain She  has no fever She had a good appetite and she ate.  Patient Vitals for the past 24 hrs:  BP Temp Temp src Pulse Resp SpO2 Height Weight  03/01/19 2028 103/71 97.9 F (36.6 C) Oral 88 20 100 % - -  03/01/19 1626 - - - - - - 5\' 4"  (1.626 m) 107.1 kg  03/01/19 1617 107/68 98.3 F (36.8 C) Oral 85 18 94 % - -  03/01/19 1400 - - - - (!) 22 - - -  03/01/19 1300 (!) 112/54 - - 84 (!) 21 96 % - -  03/01/19 1200 102/87 98.3 F (36.8 C) Oral - 20 97 % - -  03/01/19 1100 109/60 - - 77 20 97 % - -  03/01/19 1000 (!) 113/52 - - 79 16 99 % - -  03/01/19 0900 114/61 - - 89 (!) 21 95 % - -  03/01/19 0800 (!) 114/42 (!) 97.5 F (36.4 C) Axillary 75 19 99 % - -  03/01/19 0700 (!) 111/54 - - 74 16 95 % - -  03/01/19 0600 (!) 105/50 - - 77 19 100 % - -  03/01/19 0500 (!) 107/55 - - 77 (!) 24 94 % - -  03/01/19 0400 96/68 - - 77 (!) 23 99 % - -  03/01/19 0300 117/70 - - 75 18 100 % - -  03/01/19 0200 (!) 95/56 97.9 F (36.6 C) Axillary 73 15 100 % - -  03/01/19 0100 (!) 117/49 - - 87 (!) 28 100 % - -  03/01/19 0000 (!) 105/53 - - 69 19 98 % - -  02/28/19 2300 113/68 - - 74 20 98 % - -  02/28/19 2200 125/63 - - 88 18 97 % - -  02/28/19 2130 107/67 98.2 F (36.8 C) Oral 79 (!) 21 97 % 5\' 4"  (1.626 m) 104.5 kg  02/28/19 2115 - - - 81 (!) 21 94 % - -  02/28/19 2100 (!) 114/53 - - 75 19 94 % - -  02/28/19 2045 - - - 81 19 100 % - -   Awake and alert Chest bilateral air entry Decreased right side Right Pleurx catheter present Abdomen soft  Labs CBC Latest Ref Rng & Units 03/01/2019 02/26/2019 02/25/2019  WBC 4.0 - 10.5 K/uL 17.6(H) 18.4(H) 28.2(H)   Hemoglobin 12.0 - 15.0 g/dL 9.4(L) 10.8(L) 10.8(L)  Hematocrit 36.0 - 46.0 % 29.3(L) 33.9(L) 33.8(L)  Platelets 150 - 400 K/uL 324 293 302   CMP Latest Ref Rng & Units 03/01/2019 02/28/2019 02/27/2019  Glucose 70 - 99 mg/dL 253(H) 129(H) 133(H)  BUN 8 - 23 mg/dL 13 14 15   Creatinine 0.44 - 1.00 mg/dL 0.42(L) 0.52 0.64  Sodium 135 - 145 mmol/L 132(L) 131(L) 134(L)  Potassium 3.5 - 5.1 mmol/L 3.7 3.9 3.7  Chloride 98 - 111 mmol/L 97(L) 94(L) 94(L)  CO2 22 - 32 mmol/L 30 21(L) 33(H)  Calcium 8.9 - 10.3 mg/dL 8.0(L) 8.5(L) 8.5(L)  Total Protein 6.5 - 8.1 g/dL - - -  Total Bilirubin 0.3 - 1.2 mg/dL - - -  Alkaline Phos 38 - 126 U/L - - -  AST 15 - 41 U/L - - -  ALT 0 - 44 U/L - - -    CXR No pneumothorax  Impression and recommendation  Newly diagnosed adenocarcinoma of the right lung with pleural effusion Status post Pleurx catheter yesterday.  New hypotension likely due to hypovolemia has resolved Her antibiotics were escalated to Vanco and cefepime with blood cultures.  Will DC vancomycin.  And tomorrow we can change cefepime to cefazolin.    Proteus bacteremia she is she was on cefazolin and now on cefepime will get until 03/02/2019.  Leukocytosis combination of malignant effusion, infection, steroids  A. fib on amiodarone  Klebsiella in the urine has been treated  Metabolic encephalopathy secondary to hyponatremia has resolved  COPD  Discussed the management with the care team.

## 2019-03-01 NOTE — Plan of Care (Signed)
PMT note:   In to see patient. She is currently in transport to a floor bed.   No charge note.

## 2019-03-01 NOTE — Progress Notes (Signed)
Comfortable on [place] O2 Blood pressure normal No new complaints  Vitals:   03/01/19 0900 03/01/19 1000 03/01/19 1100 03/01/19 1200  BP: 114/61 (!) 113/52 109/60 102/87  Pulse: 89 79 77   Resp: (!) 21 16 20 20   Temp:    98.3 F (36.8 C)  TempSrc:    Oral  SpO2: 95% 99% 97% 97%  Weight:      Height:       Gen: NAD HEENT: NCAT, sclerae white Neck: No JVD Lungs: breath sounds full, no wheezes or other adventitious sounds Cardiovascular: RRR, no murmurs Abdomen: Soft, nontender, normal BS Ext: without clubbing, cyanosis, edema Neuro: grossly intact Skin: Limited exam, no lesions noted  BMP Latest Ref Rng & Units 03/01/2019 02/28/2019 02/27/2019  Glucose 70 - 99 mg/dL 253(H) 129(H) 133(H)  BUN 8 - 23 mg/dL 13 14 15   Creatinine 0.44 - 1.00 mg/dL 0.42(L) 0.52 0.64  Sodium 135 - 145 mmol/L 132(L) 131(L) 134(L)  Potassium 3.5 - 5.1 mmol/L 3.7 3.9 3.7  Chloride 98 - 111 mmol/L 97(L) 94(L) 94(L)  CO2 22 - 32 mmol/L 30 21(L) 33(H)  Calcium 8.9 - 10.3 mg/dL 8.0(L) 8.5(L) 8.5(L)   CBC Latest Ref Rng & Units 03/01/2019 02/26/2019 02/25/2019  WBC 4.0 - 10.5 K/uL 17.6(H) 18.4(H) 28.2(H)  Hemoglobin 12.0 - 15.0 g/dL 9.4(L) 10.8(L) 10.8(L)  Hematocrit 36.0 - 46.0 % 29.3(L) 33.9(L) 33.8(L)  Platelets 150 - 400 K/uL 324 293 302   CXR: R pleural catheter in place.  Bibasilar atelectasis  IMPRESSION: Acute hypoxemic respiratory failure R malignant pleural effusion (adenocarcinoma) Hypotension, resolved History of atrial fibrillation, on amiodarone Mild hyponatremia  PLAN/REC: Continue supplemental oxygen as needed Continue Pleurx catheter drainage Continue amiodarone Free water restriction Transfer to [facility] floor.  After transfer, PCCM will sign off. Please call if we can be of further assistance    Merton Border, MD PCCM service Mobile [phone] Pager [phone] 03/01/2019 1:57 PM

## 2019-03-01 NOTE — Progress Notes (Signed)
Will at [place] NAME: Cathy Buck    MR#:  [id number]  DATE OF BIRTH:  [birth year]  SUBJECTIVE: Patient is transferred to ICU yesterday after Pleurx catheter placement due to hypotension.  Now blood pressure is normal, patient denies any other complaints.  CHIEF COMPLAINT:   Chief Complaint  Patient presents with  . Altered Mental Status    REVIEW OF SYSTEMS:  Review of Systems  Constitutional: Negative for chills and fever.  HENT: Negative for hearing loss and tinnitus.   Eyes: Negative for blurred vision and double vision.  Respiratory: Negative for cough and shortness of breath.   Cardiovascular: Negative for chest pain and palpitations.  Gastrointestinal: Negative for abdominal pain, heartburn, nausea and vomiting.  Genitourinary: Negative for dysuria and urgency.  Musculoskeletal: Negative for myalgias.  Skin: Negative for itching and rash.  Neurological: Negative for dizziness and headaches.  Psychiatric/Behavioral: Negative for depression and substance abuse.    DRUG ALLERGIES:   Allergies  Allergen Reactions  . Codeine Hives  . Atorvastatin Other (See Comments)  . Citalopram Other (See Comments)    Nausea  . Keflex [Cephalexin] Diarrhea  . Tylenol [Acetaminophen] Hives   VITALS:  Blood pressure 102/87, pulse 77, temperature 98.3 F (36.8 C), temperature source Oral, resp. rate 20, height 5\' 4"  (1.626 m), weight 104.5 kg, SpO2 97 %. PHYSICAL EXAMINATION:  Physical Exam  GENERAL:  78 y.o.-year-old patient lying in the bed with no acute distress.  EYES: Pupils equal, round, reactive to light . No scleral icterus. Extraocular muscles intact.  HEENT: Head atraumatic, normocephalic. Oropharynx and nasopharynx clear.  NECK:  Supple, no jugular venous distention. No thyroid enlargement, no tenderness.  LUNGS: Moderately diminished breath sounds bilaterally, no wheezing, rales,rhonchi or crepitation. No use of accessory  muscles of respiration.  CARDIOVASCULAR: S1, S2 normal. No murmurs, rubs, or gallops.  ABDOMEN: Soft, nontender, nondistended. Bowel sounds present.  EXTREMITIES: No pedal edema, cyanosis, or clubbing.  NEUROLOGIC: Awake not oriented. PSYCHIATRIC: Patient awake not fully oriented  sKIN: No obvious rash, lesion, or ulcer.   LABORATORY PANEL:  Female CBC Recent Labs  Lab 03/01/19 0533  WBC 17.6*  HGB 9.4*  HCT 29.3*  PLT 324   ------------------------------------------------------------------------------------------------------------------ Chemistries  Recent Labs  Lab 02/27/19 0620  03/01/19 0533  NA 134*   < > 132*  K 3.7   < > 3.7  CL 94*   < > 97*  CO2 33*   < > 30  GLUCOSE 133*   < > 253*  BUN 15   < > 13  CREATININE 0.64   < > 0.42*  CALCIUM 8.5*   < > 8.0*  MG 2.2  --   --    < > = values in this interval not displayed.   RADIOLOGY:  Dg Chest Port 1 View  Result Date: 03/01/2019 CLINICAL DATA:  Acute respiratory failure. EXAM: PORTABLE CHEST 1 VIEW COMPARISON:  Radiograph of February 28, 2019. FINDINGS: Stable cardiomediastinal silhouette. Atherosclerosis of thoracic aorta is noted. Right-sided chest tube is unchanged in position. No pneumothorax is noted. Minimal bilateral pleural effusions are noted. Stable bibasilar atelectasis is noted. Bony thorax is unremarkable. IMPRESSION: Stable position of right-sided chest tube without definite pneumothorax. Stable bibasilar subsegmental atelectasis is noted with minimal pleural effusions. Aortic Atherosclerosis (ICD10-I70.0). Electronically Signed   By: Marijo Conception M.D.   On: 03/01/2019 09:12   Dg Chest Port 1 View  Result Date: 02/28/2019 CLINICAL DATA:  Chest tube placement EXAM: PORTABLE CHEST 1 VIEW COMPARISON:  Chest x-ray dated 02/21/2019. FINDINGS: RIGHT-sided chest tube in place with tip directed towards the medial aspects of the RIGHT upper lung. Stable opacities at the RIGHT lung base, likely residual atelectasis.  Probable mild atelectasis at the LEFT lung base. No pneumothorax seen. Heart size and mediastinal contours appear stable. IMPRESSION: RIGHT-sided chest tube in place with tip directed towards the medial aspects of the RIGHT upper lung. No pneumothorax seen. Probable residual atelectasis at the RIGHT lung base. Electronically Signed   By: Franki Cabot M.D.   On: 02/28/2019 15:21   ASSESSMENT AND PLAN:   #Acute metabolic encephalopathy from acute hypoxemic and hypercapnic respiratory failure and , underlying UTI Patient weaned off BiPAP. Also had a UTI Patient mental status is overall stable  #New onset A. fib with RVR-initially with low BP and now hemodynamically stable Rate controlled.  TSH level normal. -RecentTTEcho7/2020 with LVEF 60-65% Start Eliquis.  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  Oncology Dr. Mike Gip has seen the patient -Patient was on concurrent chemotherapy and immunotherapy prior to this admission -Plan PET scan as outpatient once stable Patient seen by cardiothoracic surgery Pleurx catheter placement today  #Acute cystitis urine culture greater than 100,000 colonies of Klebsiella ornithinolytica On Ancef  #.  Proteus mirabilis bacteremia Ancef till 02/28/2019 Appreciate ID input  #.Type 2 diabetes mellitus Patient takes metformin at home, now on sliding scale insulin with coverage, start metformin at discharge.  DVT prophylaxis-  patient on Lovenox will start Eliquis today.  And discontinue Lovenox. All the records are reviewed and case discussed with Care Management/Social Worker. Management plans discussed with the patient, family and they are in agreement.  CODE STATUS: DNR  TOTAL TIME TAKING CARE OF THIS PATIENT: 36 minutes.   POSSIBLE D/C IN 2-3 DAYS,  DEPENDING ON CLINICAL CONDITION.  Epifanio Lesches M.D on 03/01/2019 at 1:47 PM  Between 7am to 6pm - Pager - [phone]  After 6pm go to [email or web address] - Proofreader  [facility] [place] [facility]  Office  [phone]  CC: Primary care physician; Sallee Lange, NP  Note: This dictation was prepared with Dragon dictation along with smaller phrase technology. Any transcriptional errors that result from this process are unintentional.

## 2019-03-01 NOTE — Progress Notes (Signed)
Patient ID: Cathy Buck, female   DOB: December 14, 1940, 78 y.o.   MRN: [id number]  She is awake and alert this morning.  She ate breakfast and has no complaints of pain or shortness of breath  Her BP is now in the low 100s.  UO has been very good.  HR is in the 80s.  Her dressing is clean and dry. Lungs are clear bilaterally Heart regular  CT draining serous fluid.  No air leak seen  Independent review of CXRay shows no pleural fluid or pneumothorax  I milked her chest tube this morning and there was no additional return.  Would cap her PleurX and drain it once per day beginning tomorrow.    I should see her in my office in 2 weeks for suture removal.  Marta Lamas.

## 2019-03-01 NOTE — Progress Notes (Signed)
Pharmacy Antibiotic Note  Cathy Buck is a 78 y.o. female admitted on 02/18/2019 with Proteus mirabilis bacteremia and acute cystitis with Klebsiella. Patient has history of HTN, T2DM, HLD, hyponatremia, iron deficiency anemia, and stage 4 metastatic adenocarcinoma. Patient underwent insertion of pleurx catheter on 8/17 and became hypotensive and was transferred to the ICU, suspected of sepsis. Patient was given vancomycin and cefepime. Per AM rounds, vancomycin was discontinued but patient was given a dose around noon. WBC has been trending down and patient is afebrile. Pharmacy has been consulted for cefepime dosing.  Plan: Vancomycin discontinued this AM. Continue cefepime 2g Q8H.  Height: 5\' 4"  (162.6 cm) Weight: 230 lb 6.1 oz (104.5 kg) IBW/kg (Calculated) : 54.7  Temp (24hrs), Avg:98 F (36.7 C), Min:97.3 F (36.3 C), Max:98.8 F (37.1 C)  Recent Labs  Lab 02/24/19 0455 02/25/19 0059 02/26/19 0654 02/27/19 0620 02/28/19 0847 03/01/19 0533  WBC 27.0* 28.2* 18.4*  --   --  17.6*  CREATININE 0.54 0.64 0.55 0.64 0.52 0.42*    Estimated Creatinine Clearance: 68.3 mL/min (A) (by C-G formula based on SCr of 0.42 mg/dL (L)).    Allergies  Allergen Reactions  . Codeine Hives  . Atorvastatin Other (See Comments)  . Citalopram Other (See Comments)    Nausea  . Keflex [Cephalexin] Diarrhea  . Tylenol [Acetaminophen] Hives    Antimicrobials this admission: Ceftriaxone  08/07 >> 08/11 Cefazolin  08/12 >> 08/17 Vancomycin  08/17 >> 08/18 Cefepime  08/17 >>   Dose adjustments this admission: 08/14 Cefazolin increased from 1g to 2g 08/18 Vancomycin dose decreased from 1500 mg to 1250 mg  Microbiology results: 08/07 UCx: Klebsiella ornithinolytica (ampicillin resistant) 08/07 BCx: Proteus mirabilis (pan-sensitive) 08/10 Body fluid cx: no growth x 3 days 08/14 BCx: no growth x 4 days 08/17 BCx: no growth <12 hours 08/17 MRSA PCR: negative 08/18 UCx: pending   Thank you  for allowing pharmacy to be a part of this patient's care.  Cathy Buck Dear Nicholes Mango 03/01/2019 1:59 PM

## 2019-03-01 NOTE — Anesthesia Postprocedure Evaluation (Signed)
Anesthesia Post Note  Patient: Cathy Buck  Procedure(s) Performed: INSERTION PLEURAL DRAINAGE CATHETER (N/A )  Patient location during evaluation: PACU Anesthesia Type: General Level of consciousness: awake and alert Pain management: pain level controlled Vital Signs Assessment: post-procedure vital signs reviewed and stable Respiratory status: spontaneous breathing, nonlabored ventilation, respiratory function stable and patient connected to nasal cannula oxygen Cardiovascular status: blood pressure returned to baseline and stable Postop Assessment: no apparent nausea or vomiting Anesthetic complications: no     Last Vitals:  Vitals:   03/01/19 0900 03/01/19 1000  BP: 114/61 (!) 113/52  Pulse: 89 79  Resp: (!) 21 16  Temp:    SpO2: 95% 99%    Last Pain:  Vitals:   03/01/19 0800  TempSrc: Axillary  PainSc: 0-No pain                 Wanisha Shiroma S

## 2019-03-01 NOTE — Progress Notes (Signed)
Pt transferred to room 240 with portable O2, report called to [facility], VSS on 2 liters, chart meds and belongings transferred with her, daughter accompanied her to room 240

## 2019-03-01 NOTE — Progress Notes (Signed)
Patient ID: Cathy Buck, female   DOB: September 17, 1940, 79 y.o.   MRN: [id number]  This NP spoke to daughter Cathy Buck by telephone for ongoing palliative medcine needs and emotional support.  Cathy Buck tells me that the patient is "doing a little bit better".  She did have a Pleurx cath placed yesterday and remains in the ICU after some issues with her blood pressure.  Family feels good about the patient's progress and plan is still once patient is stable for discharge to transition to skilled nursing facility for short-term rehab in hopes of proceeding with oncology treatment for her newly diagnosed cancer  Future decisions will depend on how the patient progresses.  If she improves family will consider oncology treatments. If she fails to thrive, a shift to comfort will be put in place, along with hospice services.  Discussed again with daughter the seriousness of the current medical situation and the high risk for decompensation.  She verbalizes understanding.   Questions and concerns addressed    FAmily encouraged to call with questions or concerns.  Total time spent was 2 minutes  Greater than 50% of the time was spent in counseling and coordination of care  Wadie Lessen NP  Palliative Medicine Team Team Phone # [phone] Pager [phone]

## 2019-03-01 NOTE — Progress Notes (Signed)
Hematology/Oncology Progress Note [facility] [phone] [phone]  Patient Care Team: Sallee Lange, NP as PCP - General (Internal Medicine) Telford Nab, RN as Registered Nurse   Name of the patient: Cathy Buck  [id number]  [birth year]  Date of visit: 03/01/19   INTERVAL HISTORY-  S/p pleurx catheter placement.  Postop patient became hypotensive secondary to hypovolemia postop requiring transferring to ICU. Currently resting in bed comfortably. Patient denies new complaints today.  Feels comfortable.  Review of systems- Review of Systems  Unable to perform ROS: Acuity of condition  Respiratory: Negative for shortness of breath.     Allergies  Allergen Reactions   Codeine Hives   Atorvastatin Other (See Comments)   Citalopram Other (See Comments)    Nausea   Keflex [Cephalexin] Diarrhea   Tylenol [Acetaminophen] Hives    Patient Active Problem List   Diagnosis Date Noted   Cough    Status post thoracentesis    Palliative care by specialist    DNR (do not resuscitate)    Goals of care, counseling/discussion    Absolute anemia    Altered mental status 02/18/2019   Primary malignant neoplasm of lung metastatic to other site [facility])    Pleural effusion    Weakness generalized    Hyponatremia 02/03/2019   HTN (hypertension) 02/03/2019   Diabetes ([place]) 02/03/2019     Past Medical History:  Diagnosis Date   Diabetes mellitus without complication ([place])    Hypertension      Past Surgical History:  Procedure Laterality Date   ABDOMINAL HYSTERECTOMY     CHEST TUBE INSERTION N/A 02/28/2019   Procedure: INSERTION PLEURAL DRAINAGE CATHETER;  Surgeon: [name], MD;  Location: [facility] ORS;  Service: Thoracic;  Laterality: N/A;   CHOLECYSTECTOMY      Social History   Socioeconomic History   Marital status: Married    Spouse name: Not on file   Number of children: Not on file   Years  of education: Not on file   Highest education level: Not on file  Occupational History   Not on file  Social Needs   Financial resource strain: Not hard at all   Food insecurity    Worry: Never true    Inability: Never true   Transportation needs    Medical: No    Non-medical: No  Tobacco Use   Smoking status: Former Smoker   Smokeless tobacco: Never Used  Substance and Sexual Activity   Alcohol use: Not Currently   Drug use: Not Currently   Sexual activity: Not Currently  Lifestyle   Physical activity    Days per week: 0 days    Minutes per session: 0 min   Stress: Not at all  Relationships   Social connections    Talks on phone: Once a week    Gets together: Once a week    Attends religious service: 1 to 4 times per year    Active member of club or organization: No    Attends meetings of clubs or organizations: Never    Relationship status: Married   Intimate partner violence    Fear of current or ex partner: No    Emotionally abused: No    Physically abused: No    Forced sexual activity: No  Other Topics Concern   Not on file  Social History Narrative   Lives at home with husband.  Uses cane, walker and rollator a times     History reviewed.  No pertinent family history.   Current Facility-Administered Medications:    0.9 %  sodium chloride infusion, , Intravenous, PRN, Mayo, Pete Pelt, MD, Last Rate: 10 mL/hr at 02/28/19 2147   amiodarone (PACERONE) tablet 200 mg, 200 mg, Oral, BID, Mayo, Pete Pelt, MD, 200 mg at 03/01/19 0849   calcium-vitamin D (OSCAL WITH D) 500-200 MG-UNIT per tablet 1 tablet, 1 tablet, Oral, Q breakfast, Mayo, Pete Pelt, MD, 1 tablet at 03/01/19 0849   ceFEPIme (MAXIPIME) 2 g in sodium chloride 0.9 % 100 mL IVPB, 2 g, Intravenous, Q8H, Orene Desanctis, RPH, Last Rate: 200 mL/hr at 03/01/19 0846, 2 g at 03/01/19 0846   Chlorhexidine Gluconate Cloth 2 % PADS 6 each, 6 each, Topical, Q0600, Awilda Bill, NP   feeding  supplement (ENSURE ENLIVE) (ENSURE ENLIVE) liquid 237 mL, 237 mL, Oral, BID BM, Mayo, Pete Pelt, MD, 237 mL at 03/01/19 0850   feeding supplement (PRO-STAT SUGAR FREE 64) liquid 30 mL, 30 mL, Oral, Daily, Mayo, Pete Pelt, MD, 30 mL at 03/01/19 0849   ferrous sulfate tablet 325 mg, 325 mg, Oral, BID WC, Mayo, Pete Pelt, MD, 325 mg at 03/01/19 0848   fluticasone furoate-vilanterol (BREO ELLIPTA) 100-25 MCG/INH 1 puff, 1 puff, Inhalation, Daily, Wilhelmina Mcardle, MD, 1 puff at 03/01/19 1030   insulin aspart (novoLOG) injection 0-9 Units, 0-9 Units, Subcutaneous, TID WC, Konidena, Snehalatha, MD   ipratropium-albuterol (DUONEB) 0.5-2.5 (3) MG/3ML nebulizer solution 3 mL, 3 mL, Nebulization, Q6H PRN, Wilhelmina Mcardle, MD   morphine 2 MG/ML injection 2 mg, 2 mg, Intravenous, Q3H PRN, Wilhelmina Mcardle, MD   multivitamin with minerals tablet 1 tablet, 1 tablet, Oral, Daily, Mayo, Pete Pelt, MD, 1 tablet at 03/01/19 0848   pantoprazole (PROTONIX) EC tablet 40 mg, 40 mg, Oral, Daily, Mayo, Pete Pelt, MD, 40 mg at 03/01/19 0849   polyethylene glycol (MIRALAX / GLYCOLAX) packet 17 g, 17 g, Oral, Daily PRN, Mayo, Pete Pelt, MD   traZODone (DESYREL) tablet 50 mg, 50 mg, Oral, QHS PRN, Mayo, Pete Pelt, MD, 50 mg at 02/22/19 2136   vancomycin (VANCOCIN) 1,250 mg in sodium chloride 0.9 % 250 mL IVPB, 1,250 mg, Intravenous, Q24H, Mayo, Pete Pelt, MD   vitamin C (ASCORBIC ACID) tablet 250 mg, 250 mg, Oral, BID, Mayo, Pete Pelt, MD, 250 mg at 03/01/19 0849   Physical exam:  Vitals:   03/01/19 0700 03/01/19 0800 03/01/19 0900 03/01/19 1000  BP: (!) 111/54 (!) 114/42 114/61 (!) 113/52  Pulse: 74 75 89 79  Resp: 16 19 (!) 21 16  Temp:  (!) 97.5 F (36.4 C)    TempSrc:  Axillary    SpO2: 95% 99% 95% 99%  Weight:      Height:       Physical Exam  Constitutional: She is oriented to person, place, and time. No distress.  HENT:  Head: Normocephalic and atraumatic.  Mouth/Throat: No oropharyngeal  exudate.  Eyes: Pupils are equal, round, and reactive to light. EOM are normal. No scleral icterus.  Neck: Normal range of motion. Neck supple.  Cardiovascular: Normal rate and regular rhythm.  No murmur heard. Pulmonary/Chest: Effort normal.  Decreased breath sound bilaterally. Breathing via nasal cannula oxygen.  Abdominal: Soft. Bowel sounds are normal. She exhibits no distension. There is no abdominal tenderness.  Musculoskeletal: Normal range of motion.        General: No edema.  Neurological: She is alert and oriented to person, place, and time.  Orientated to  herself and place.  Skin: Skin is warm and dry. She is not diaphoretic. No erythema.  Psychiatric: Affect normal.       CMP Latest Ref Rng & Units 03/01/2019  Glucose 70 - 99 mg/dL 253(H)  BUN 8 - 23 mg/dL 13  Creatinine 0.44 - 1.00 mg/dL 0.42(L)  Sodium 135 - 145 mmol/L 132(L)  Potassium 3.5 - 5.1 mmol/L 3.7  Chloride 98 - 111 mmol/L 97(L)  CO2 22 - 32 mmol/L 30  Calcium 8.9 - 10.3 mg/dL 8.0(L)  Total Protein 6.5 - 8.1 g/dL -  Total Bilirubin 0.3 - 1.2 mg/dL -  Alkaline Phos 38 - 126 U/L -  AST 15 - 41 U/L -  ALT 0 - 44 U/L -   CBC Latest Ref Rng & Units 03/01/2019  WBC 4.0 - 10.5 K/uL 17.6(H)  Hemoglobin 12.0 - 15.0 g/dL 9.4(L)  Hematocrit 36.0 - 46.0 % 29.3(L)  Platelets 150 - 400 K/uL 324   RADIOGRAPHIC STUDIES: I have personally reviewed the radiological images as listed and agreed with the findings in the report.  Dg Chest 2 View  Result Date: 02/10/2019 CLINICAL DATA:  Pleural effusion EXAM: CHEST - 2 VIEW COMPARISON:  Two days ago FINDINGS: Right PICC with tip at the right atrium. Haziness in the lower right chest. Hyperinflation with diaphragm flattening. Thickened right hilum. There was adenopathy on preceding chest CT. Normal heart size. IMPRESSION: Unchanged right pleural effusion and lower lobe opacification. Electronically Signed   By: Monte Fantasia M.D.   On: 02/10/2019 10:39   Ct Head Wo  Contrast  Result Date: 02/18/2019 CLINICAL DATA:  Altered level of consciousness, unexplained, lethargy, hypotension, diabetes mellitus, hypertension, lung cancer EXAM: CT HEAD WITHOUT CONTRAST TECHNIQUE: Contiguous axial images were obtained from the base of the skull through the vertex without intravenous contrast. Sagittal and coronal MPR images reconstructed from axial data set. COMPARISON:  MR brain 02/07/2019 FINDINGS: Brain: Mild generalized atrophy. Normal ventricular morphology. No midline shift or mass effect.2 dense calcification of falx. No intracranial hemorrhage, mass lesion or evidence of acute infarction. No extra-axial fluid collections. Vascular: No hyperdense vessels Skull: Intact Sinuses/Orbits: Clear Other: N/A IMPRESSION: No acute intracranial abnormalities. Electronically Signed   By: Lavonia Dana M.D.   On: 02/18/2019 11:14   Ct Chest Wo Contrast  Result Date: 02/04/2019 CLINICAL DATA:  Shortness of breath EXAM: CT CHEST WITHOUT CONTRAST TECHNIQUE: Multidetector CT imaging of the chest was performed following the standard protocol without IV contrast. COMPARISON:  Plain film from previous day FINDINGS: Cardiovascular: Somewhat limited due to lack of IV contrast. Aortic calcifications are noted without significant aneurysmal dilatation. No cardiac enlargement is seen. No pericardial effusion is noted. Mediastinum/Nodes: The esophagus is within normal limits. No definitive hilar adenopathy is noted. There is a right paratracheal node which measures approximately 16 mm in short axis as well as a subcarinal node which measures 17 mm in short axis. These are likely reactive in nature. Thoracic inlet is within normal limits. Lungs/Pleura: The left lung is well aerated with minimal atelectatic changes in the lingula along the cardiac border. No sizable effusion is noted. The right hemithorax demonstrates a moderate to large pleural effusion increased from the prior exam. Underlying patchy  infiltrate is noted throughout the right lung with more marked consolidation in the right lung base. No definitive nodule is noted. Upper Abdomen: Somewhat limited due to lack of IV contrast. No definitive abnormality is noted. Musculoskeletal: Mild degenerative change of the thoracic spine is noted.  No acute bony abnormality is seen. IMPRESSION: Moderate to large right-sided pleural effusion with associated consolidation most marked in the right lower lobe. Mild left basilar atelectasis. Prominent mediastinal lymph nodes likely reactive in nature. Aortic Atherosclerosis (ICD10-I70.0). Electronically Signed   By: Inez Catalina M.D.   On: 02/04/2019 15:36   Mr Brain Wo Contrast  Result Date: 02/18/2019 CLINICAL DATA:  Altered level of consciousness with change in speech. History of hypertension diabetes and lung cancer. EXAM: MRI HEAD WITHOUT CONTRAST TECHNIQUE: Multiplanar, multiecho pulse sequences of the brain and surrounding structures were obtained without intravenous contrast. COMPARISON:  CT head 02/18/2019 FINDINGS: Brain: Motion degraded study. Rapid scanning utilized due to motion. Negative for acute infarct. Negative for mass or edema. Ventricle size normal. No hemorrhage or fluid collection. Scattered small white matter hyperintensities bilaterally. Vascular: Normal arterial flow voids Skull and upper cervical spine: Negative Sinuses/Orbits: Negative Other: None IMPRESSION: Motion degraded study No acute abnormality. Minimal white matter disease which appears chronic and unchanged from prior MRI Electronically Signed   By: Franchot Gallo M.D.   On: 02/18/2019 22:07   Mr Brain Wo Contrast  Result Date: 02/07/2019 CLINICAL DATA:  Initial evaluation for acute confusion, encephalopathy. EXAM: MRI HEAD WITHOUT CONTRAST TECHNIQUE: Multiplanar, multiecho pulse sequences of the brain and surrounding structures were obtained without intravenous contrast. COMPARISON:  None available. FINDINGS: Brain:  Examination mildly degraded by motion artifact. Diffuse prominence of the CSF containing spaces compatible with generalized age-related cerebral atrophy. Mild scattered patchy T2/FLAIR hyperintensity within the periventricular deep white matter both cerebral hemispheres, most consistent with chronic microvascular ischemic disease, mild for age. No abnormal foci of restricted diffusion to suggest acute or subacute ischemia. Gray-white matter differentiation maintained. No encephalomalacia to suggest chronic cortical infarction. No foci of susceptibility artifact to suggest acute or chronic intracranial hemorrhage. No mass lesion, midline shift or mass effect. No hydrocephalus. The extra-axial fluid collection. Pituitary gland within normal limits. Midline structures intact. Vascular: Major intracranial vascular flow voids are maintained Skull and upper cervical spine: Craniocervical junction within normal limits. Multilevel degenerative spondylolysis noted within the visualized upper cervical spine without significant stenosis. Bone marrow signal intensity normal. No scalp soft tissue abnormality. Sinuses/Orbits: Globes and orbital soft tissues within normal limits. Paranasal sinuses are clear. Left mastoid effusion noted. Inner ear structures grossly normal. Other: None. IMPRESSION: 1. No acute intracranial abnormality. 2. Mild age-related cerebral atrophy with chronic microvascular ischemic disease. 3. Left mastoid effusion, of uncertain significance. Correlation with physical exam and symptomatology for possible otomastoiditis recommended. Electronically Signed   By: Jeannine Boga M.D.   On: 02/07/2019 20:33   Dg Chest Right Decubitus  Result Date: 02/14/2019 CLINICAL DATA:  Pleural effusion. EXAM: CHEST - RIGHT DECUBITUS COMPARISON:  02/10/2019. FINDINGS: Right central line in stable position. Layering moderate sized right pleural effusion is noted. Underlying atelectatic changes are present. No  pneumothorax noted. IMPRESSION: Layering moderate sized right pleural effusion. Electronically Signed   By: Marcello Moores  Register   On: 02/14/2019 06:32   Dg Chest Port 1 View  Result Date: 03/01/2019 CLINICAL DATA:  Acute respiratory failure. EXAM: PORTABLE CHEST 1 VIEW COMPARISON:  Radiograph of February 28, 2019. FINDINGS: Stable cardiomediastinal silhouette. Atherosclerosis of thoracic aorta is noted. Right-sided chest tube is unchanged in position. No pneumothorax is noted. Minimal bilateral pleural effusions are noted. Stable bibasilar atelectasis is noted. Bony thorax is unremarkable. IMPRESSION: Stable position of right-sided chest tube without definite pneumothorax. Stable bibasilar subsegmental atelectasis is noted with minimal pleural effusions. Aortic  Atherosclerosis (ICD10-I70.0). Electronically Signed   By: Marijo Conception M.D.   On: 03/01/2019 09:12   Dg Chest Port 1 View  Result Date: 02/28/2019 CLINICAL DATA:  Chest tube placement EXAM: PORTABLE CHEST 1 VIEW COMPARISON:  Chest x-ray dated 02/21/2019. FINDINGS: RIGHT-sided chest tube in place with tip directed towards the medial aspects of the RIGHT upper lung. Stable opacities at the RIGHT lung base, likely residual atelectasis. Probable mild atelectasis at the LEFT lung base. No pneumothorax seen. Heart size and mediastinal contours appear stable. IMPRESSION: RIGHT-sided chest tube in place with tip directed towards the medial aspects of the RIGHT upper lung. No pneumothorax seen. Probable residual atelectasis at the RIGHT lung base. Electronically Signed   By: Franki Cabot M.D.   On: 02/28/2019 15:21   Dg Chest Port 1 View  Result Date: 02/21/2019 CLINICAL DATA:  Status post right thoracentesis. EXAM: PORTABLE CHEST 1 VIEW COMPARISON:  Radiographs of February 19, 2019. FINDINGS: Stable cardiomediastinal silhouette. Atherosclerosis of thoracic aorta is noted. No pneumothorax or significant pleural effusion is noted. Left lung is clear. Improved  right basilar opacity is noted, with residual atelectasis or infiltrate. Bony thorax is unremarkable. IMPRESSION: No pneumothorax is noted. No significant pleural effusion is noted. Mild right basilar opacity is noted as described above. Electronically Signed   By: Marijo Conception M.D.   On: 02/21/2019 12:57   Dg Chest Port 1 View  Result Date: 02/19/2019 CLINICAL DATA:  Shortness of breath. EXAM: PORTABLE CHEST 1 VIEW COMPARISON:  02/18/2019 FINDINGS: Stable heart size and aortic tortuosity. Increased atelectasis of the right lower lung with probable component of moderate right pleural effusion. No overt pulmonary edema. No pneumothorax. IMPRESSION: Increased atelectasis of the right lung with probable component of moderate right pleural effusion. Electronically Signed   By: Aletta Edouard M.D.   On: 02/19/2019 12:56   Dg Chest Portable 1 View  Result Date: 02/18/2019 CLINICAL DATA:  Weakness.  Confusion. EXAM: PORTABLE CHEST 1 VIEW COMPARISON:  Weakness and confusion. FINDINGS: Interval removal of right PICC line. Heart size normal. Pulmonary venous congestion. Progressive right base atelectasis/infiltrate and right-sided pleural effusion. Left base subsegmental atelectasis again noted. No pneumothorax. IMPRESSION: 1.  Interim removal of right PICC line. 2. Progressive right base atelectasis/infiltrate and right-sided pleural effusion. Mild left base subsegmental atelectasis again noted. Electronically Signed   By: Marcello Moores  Register   On: 02/18/2019 09:54   Dg Chest Port 1 View  Result Date: 02/14/2019 CLINICAL DATA:  History of lung cancer, post right-sided thoracentesis. EXAM: PORTABLE CHEST 1 VIEW COMPARISON:  Ultrasound-guided thoracentesis-02/14/2019; chest radiograph-earlier same day; 01/11/2019; chest CT-02/04/2019 FINDINGS: Grossly unchanged enlarged cardiac silhouette and mediastinal contours with atherosclerotic plaque within thoracic aorta. Stable position of support apparatus. Interval  reduction/resolution of right-sided pleural effusion post thoracentesis. No pneumothorax. Improved aeration of the right lung base with persistent right basilar heterogeneous opacities, likely atelectasis. No new focal airspace opacities. The left hemithorax remains well aerated. No evidence of edema. No acute osseous abnormalities. IMPRESSION: Interval reduction/resolution of right-sided pleural effusion post thoracentesis. No pneumothorax. Electronically Signed   By: Sandi Mariscal M.D.   On: 02/14/2019 10:58   Dg Chest Port 1 View  Result Date: 02/08/2019 CLINICAL DATA:  Followup pleural effusion. EXAM: PORTABLE CHEST 1 VIEW COMPARISON:  02/07/2019 and earlier exams. FINDINGS: Opacity at the right lung base mostly obscures hemidiaphragm consistent with a small effusion and atelectasis. Opacity at the left lung base is less prominent than on the previous day's study,  the difference likely due to differences in patient positioning only. There is hazy opacity consistent with a smaller left pleural effusion. No new lung abnormalities. No evidence of pulmonary edema. No pneumothorax. Right-sided PICC is stable. IMPRESSION: 1. No significant change from the most recent prior study allowing for differences in patient positioning. 2. Small residual right pleural effusion with associated atelectasis. Smaller left pleural effusion with atelectasis. No convincing pulmonary edema. No pneumothorax. Electronically Signed   By: Lajean Manes M.D.   On: 02/08/2019 13:25   Dg Chest Port 1 View  Result Date: 02/07/2019 CLINICAL DATA:  Status post right-sided thoracentesis. EXAM: PORTABLE CHEST 1 VIEW COMPARISON:  02/05/2019 FINDINGS: The heart is enlarged but stable. Stable tortuosity and calcification of the thoracic aorta. The right PICC line is stable. Status post right-sided thoracentesis with interval decrease in size of the right pleural effusion. No postprocedural pneumothorax is identified. Small left pleural effusion  and bibasilar atelectasis. IMPRESSION: Status post right-sided thoracentesis with near complete evacuation of the right-sided pleural effusion. No postprocedural pneumothorax. Persistent small left effusion and moderate bibasilar atelectasis. Electronically Signed   By: Marijo Sanes M.D.   On: 02/07/2019 10:33   Dg Chest Port 1 View  Result Date: 02/07/2019 CLINICAL DATA:  Shortness of breath. Patient experiencing increased shortness of breath. EXAM: PORTABLE CHEST 1 VIEW COMPARISON:  Portable chest radiograph 02/05/2019 FINDINGS: Unchanged position of a right-sided PICC with tip projecting over the upper right atrium. May consider withdrawing catheter 2.5-3 cm to place tip cavoatrial junction. The cardiomediastinal silhouette is unchanged. Interval increase in size of a right pleural effusion with increasing right basilar atelectasis. Underlying right basilar pneumonia cannot be excluded. Ill-defined opacity throughout the mid to lower right lung has also increased and may reflect incomplete atelectasis and/or developing pneumonia. Mild left basilar atelectasis is unchanged. No evidence of pneumothorax. Degenerative changes of the spine. IMPRESSION: Unchanged position of a right PICC with tip projecting over the right atrium. Consider withdrawing catheter 2.5-3 cm to place tip at cavoatrial junction. Interval increase in size of a right pleural effusion with increasing right basilar atelectasis. Underlying right basilar pneumonia cannot be excluded. Interval increase in ill-defined opacity throughout the mid to lower right lung may reflect incomplete atelectasis and/or developing pneumonia. Electronically Signed   By: Kellie Simmering   On: 02/07/2019 08:14   Dg Chest Port 1 View  Result Date: 02/05/2019 CLINICAL DATA:  PICC line placement EXAM: PORTABLE CHEST 1 VIEW COMPARISON:  Portable exam 2225 hours compared to 1631 hours FINDINGS: RIGHT arm PICC line with tip projecting over superior RIGHT atrium. May  consider withdrawing catheter 2.5-3.0 cm to place tip at cavoatrial junction. Stable heart size and mediastinal contours. RIGHT pleural effusion and basilar atelectasis with questionable RIGHT perihilar infiltrate. Minimal LEFT base atelectasis. IMPRESSION: Tip of RIGHT arm PICC line projects over RIGHT atrium; recommend withdrawal 2.5 x 3.0 cm to place tip at approximately the cavoatrial junction. Otherwise no interval change. Electronically Signed   By: Lavonia Dana M.D.   On: 02/05/2019 22:34   Dg Chest Port 1 View  Result Date: 02/05/2019 CLINICAL DATA:  PICC line placement EXAM: PORTABLE CHEST 1 VIEW COMPARISON:  Portable exam 1613 hours compared to 02/03/2019 FINDINGS: RIGHT arm PICC line tip deflects into the azygos arch. Upper normal heart size. Mediastinal contours and pulmonary vascularity normal. Atherosclerotic calcification aorta. RIGHT lung infiltrate with small RIGHT pleural effusion and basilar atelectasis. LEFT lung clear. No pneumothorax or acute osseous findings. IMPRESSION: Tip of RIGHT  arm PICC line deflects into the azygos arch, recommend repositioning into SVC. Increased RIGHT lung infiltrates, RIGHT pleural effusion and basilar atelectasis. Findings called to [facility] in ICU on 02/05/2019 at 1839 hrs. Electronically Signed   By: Lavonia Dana M.D.   On: 02/05/2019 18:40   Dg Chest Portable 1 View  Result Date: 02/03/2019 CLINICAL DATA:  Shortness of breath. EXAM: PORTABLE CHEST 1 VIEW COMPARISON:  None. FINDINGS: The heart size and pulmonary vascularity are normal. Aortic atherosclerosis. Small right pleural effusion. Focal linear atelectasis in the right midzone. No acute bone abnormality. IMPRESSION: 1. Small right pleural effusion. Slight atelectasis in the right midzone. 2. Aortic atherosclerosis. Electronically Signed   By: Lorriane Shire M.D.   On: 02/03/2019 18:36   [place] Ekg Site Rite  Result Date: 02/05/2019 If Site Rite image not attached, placement could not be confirmed due  to current cardiac rhythm.  US Thoracentesis Asp Pleural Space W/img Guide  Result Date: 02/21/2019 INDICATION: Pleural effusion. EXAM: ULTRASOUND GUIDED RIGHT THORACENTESIS MEDICATIONS: None. COMPLICATIONS: None immediate. PROCEDURE: An ultrasound guided thoracentesis was thoroughly discussed with the patient and questions answered. The benefits, risks, alternatives and complications were also discussed. The patient understands and wishes to proceed with the procedure. Written consent was obtained. Ultrasound was performed to localize and mark an adequate pocket of fluid in the right chest. The area was then prepped and draped in the normal sterile fashion. 1% Lidocaine was used for local anesthesia. Under ultrasound guidance a 6 French catheter was introduced. Thoracentesis was performed. The catheter was removed and a dressing applied. FINDINGS: A total of approximately 800 cc of bloody fluid was removed. Samples were sent to the laboratory as requested by the clinical team. IMPRESSION: Successful ultrasound guided right thoracentesis yielding 800 cc of bloody pleural fluid. 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 Please from ultrasound-guided thoracentesis for diagnostic and therapeutic purposes. EXAM: US THORACENTESIS ASP PLEURAL SPACE W/IMG GUIDE COMPARISON:  Ultrasound-guided thoracentesis-02/07/2019 yielding 1.2 L fluid. Chest radiograph-earlier same day; 02/10/2019; chest CT-02/04/2019 MEDICATIONS: None. COMPLICATIONS: None immediate. TECHNIQUE: Informed written consent was obtained from the the patient's daughter after a discussion of the risks, benefits and alternatives to treatment. A timeout was performed prior to the initiation of the procedure. Patient was positioned left lateral decubitus in her  hospital bed with initial ultrasound scanning demonstrating a recurrent moderate to large sized anechoic right-sided pleural effusion. The inferolateral aspect of the right lower chest was prepped and draped in the usual sterile fashion. 1% lidocaine was used for local anesthesia. An ultrasound image was saved for documentation purposes. An 8 Fr Safe-T-Centesis catheter was introduced. The thoracentesis was performed. The catheter was removed and a dressing was applied. The patient tolerated the procedure well without immediate post procedural complication. The patient was escorted to have an upright chest radiograph. FINDINGS: A total of approximately 1.4 liters of serous fluid was removed. Requested samples were sent to the laboratory. IMPRESSION: Successful ultrasound-guided right sided thoracentesis yielding 1.4 liters of pleural fluid. Electronically Signed   By: Sandi Mariscal M.D.   On: 02/14/2019 11:00   US Thoracentesis Asp Pleural Space W/img Guide  Result Date: 02/07/2019 INDICATION: Right pleural effusion, hypertension, diabetes EXAM: ULTRASOUND GUIDED RIGHT THORACENTESIS MEDICATIONS: 1% lidocaine local COMPLICATIONS: None immediate. PROCEDURE: An ultrasound guided thoracentesis was thoroughly discussed  with the patient and questions answered. The benefits, risks, alternatives and complications were also discussed. The patient understands and wishes to proceed with the procedure. Written consent was obtained. Ultrasound was performed to localize and mark an adequate pocket of fluid in the right chest. The area was then prepped and draped in the normal sterile fashion. 1% Lidocaine was used for local anesthesia. Under ultrasound guidance a 6 Fr Safe-T-Centesis catheter was introduced. Thoracentesis was performed. The catheter was removed and a dressing applied. FINDINGS: A total of approximately 1.2 L of amber colored pleural fluid was removed. Samples were sent to the laboratory as requested by the  clinical team. IMPRESSION: Successful ultrasound guided right thoracentesis yielding 1.2 L of pleural fluid. Electronically Signed   By: Jerilynn Mages.  Shick M.D.   On: 02/07/2019 10:10    Assessment and plan-  Patient is a 78 y.o. female with newly diagnosed stage IV lung adenocarcinoma currently admitted due to altered mental status.  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 Genevive Bi.  Postop need to be transferred to ICU/stepdown due to hypo-tension. Continue supportive care. X-ray was independent reviewed by me. Given her age and performance status, she would not be a good candidate for intensive chemotherapy treatment.  She may be able to tolerate immunotherapy outpatient if she continues to improve and stay in stable condition outpatient.  On steroid tapering course.  #Hyponatremia secondary to SIADH sodium level 132.  Stable. #Mental status clear to her baseline. #Anemia, hemoglobin 9.4, microcytic.  Continue oral iron supplementation.  Bowel regimen PRN. #Atrial fibrillation continue amiodarone #UTI bacteremia, on IVantibiotics, ID following.    thank you for allowing me to participate in the care of this patient.    Earlie Server, MD, PhD Hematology Oncology [facility] Pager- [phone] 03/01/2019

## 2019-03-01 NOTE — Progress Notes (Signed)
[place] for apixaban Indication: nonvalvular atrial fibrillation  Allergies  Allergen Reactions  . Codeine Hives  . Atorvastatin Other (See Comments)  . Citalopram Other (See Comments)    Nausea  . Keflex [Cephalexin] Diarrhea  . Tylenol [Acetaminophen] Hives    Patient Measurements: Height: 5\' 4"  (162.6 cm) Weight: 230 lb 6.1 oz (104.5 kg) IBW/kg (Calculated) : 54.7  Vital Signs: Temp: 98.3 F (36.8 C) (08/18 1200) Temp Source: Oral (08/18 1200) BP: 112/54 (08/18 1300) Pulse Rate: 84 (08/18 1300)  Labs: Recent Labs    02/27/19 0620 02/28/19 0847 03/01/19 0533  HGB  --   --  9.4*  HCT  --   --  29.3*  PLT  --   --  324  CREATININE 0.64 0.52 0.42*    Estimated Creatinine Clearance: 68.3 mL/min (A) (by C-G formula based on SCr of 0.42 mg/dL (L)).   Medications:  Scheduled:  . amiodarone  200 mg Oral BID  . calcium-vitamin D  1 tablet Oral Q breakfast  . Chlorhexidine Gluconate Cloth  6 each Topical Q0600  . feeding supplement (ENSURE ENLIVE)  237 mL Oral BID BM  . feeding supplement (PRO-STAT SUGAR FREE 64)  30 mL Oral Daily  . ferrous sulfate  325 mg Oral BID WC  . fluticasone furoate-vilanterol  1 puff Inhalation Daily  . insulin aspart  0-9 Units Subcutaneous TID WC  . multivitamin with minerals  1 tablet Oral Daily  . pantoprazole  40 mg Oral Daily  . vitamin C  250 mg Oral BID   Infusions:  . sodium chloride 10 mL/hr at 02/28/19 2147  . ceFEPime (MAXIPIME) IV Stopped (03/01/19 0916)  . vancomycin 166.7 mL/hr at 03/01/19 1400    Assessment: Pharmacy consulted to start apixaban for nonvalvular Afib. Current Scr <1.5 mg/dL but does not warrant renal dosing change due to patient not being >80 YO and <60 kg. Patient previously on enoxaparin 1 mg/kg (last given 8/16), but held for pleurax catheter insertion that was done on Monday.  Goal of Therapy:  Monitor platelets by anticoagulation protocol: Yes    Plan:   Initiate apixaban 5 mg BID.   Leveda Kendrix Dear Nicholes Mango 03/01/2019,3:24 PM

## 2019-03-01 NOTE — Anesthesia Postprocedure Evaluation (Signed)
Anesthesia Post Note  Patient: Cathy Buck  Procedure(s) Performed: INSERTION PLEURAL DRAINAGE CATHETER (N/A )  Patient location during evaluation: ICU Anesthesia Type: General Level of consciousness: patient cooperative and responds to stimulation Pain management: pain level controlled Vital Signs Assessment: post-procedure vital signs reviewed and stable Respiratory status: spontaneous breathing, nonlabored ventilation and respiratory function stable Cardiovascular status: stable Anesthetic complications: no     Last Vitals:  Vitals:   03/01/19 0500 03/01/19 0600  BP: (!) 107/55 (!) 105/50  Pulse: 77 77  Resp: (!) 24 19  Temp:    SpO2: 94% 100%    Last Pain:  Vitals:   03/01/19 0200  TempSrc: Axillary  PainSc: 0-No pain                 Kohner Orlick,  Avontae Burkhead R

## 2019-03-02 DIAGNOSIS — Z7901 Long term (current) use of anticoagulants: Secondary | ICD-10-CM

## 2019-03-02 LAB — CULTURE, BLOOD (ROUTINE X 2)
Culture: NO GROWTH
Culture: NO GROWTH

## 2019-03-02 LAB — BASIC METABOLIC PANEL
Anion gap: 7 (ref 5–15)
BUN: 16 mg/dL (ref 8–23)
CO2: 30 mmol/L (ref 22–32)
Calcium: 8.8 mg/dL — ABNORMAL LOW (ref 8.9–10.3)
Chloride: 98 mmol/L (ref 98–111)
Creatinine, Ser: 0.47 mg/dL (ref 0.44–1.00)
GFR calc Af Amer: >60 mL/min (ref >60)
GFR calc non Af Amer: >60 mL/min (ref >60)
Glucose, Bld: 130 mg/dL — ABNORMAL HIGH (ref 70–99)
Potassium: 3.8 mmol/L (ref 3.5–5.1)
Sodium: 135 mmol/L (ref 135–145)

## 2019-03-02 LAB — GLUCOSE, CAPILLARY
Glucose-Capillary: 107 mg/dL — ABNORMAL HIGH (ref 70–99)
Glucose-Capillary: 165 mg/dL — ABNORMAL HIGH (ref 70–99)
Glucose-Capillary: 238 mg/dL — ABNORMAL HIGH (ref 70–99)

## 2019-03-02 LAB — URINE CULTURE: Culture: NO GROWTH

## 2019-03-02 LAB — PHOSPHORUS: Phosphorus: 1.4 mg/dL — ABNORMAL LOW (ref 2.5–4.6)

## 2019-03-02 MED ORDER — SODIUM PHOSPHATES 45 MMOLE/15ML IV SOLN
20.0000 mmol | Freq: Once | INTRAVENOUS | Status: AC
  Administered 2019-03-02: 20 mmol via INTRAVENOUS
  Filled 2019-03-02: qty 6.67

## 2019-03-02 MED ORDER — INSULIN ASPART 100 UNIT/ML ~~LOC~~ SOLN
0.0000 [IU] | Freq: Every day | SUBCUTANEOUS | Status: DC
  Administered 2019-03-02: 2 [IU] via SUBCUTANEOUS
  Filled 2019-03-02: qty 1

## 2019-03-02 NOTE — Progress Notes (Signed)
Patient wore Bipap for less than 5 min before requesting to take it off. Patient states she does not wish to wear the mask anymore. Patient resting comfortably in bed at this time. No distress noted. Will continue to monitor.

## 2019-03-02 NOTE — Progress Notes (Signed)
Patient remains strable,60 ml output from pleurex catheter,eating and drinking,iv antibiotics continue

## 2019-03-02 NOTE — Progress Notes (Signed)
Patient was bladder scanned because she had not urinated since her foley was D/C'd. Bladder scanned showed 285 ml and patient stated that she does not need to void at the moment. Will continue to monitor.

## 2019-03-02 NOTE — Consult Note (Addendum)
[place] Nurse wound consult note Reason for Consult: Consult requested for bilat legs.   Wound type: Some generalized edema, not severe enough to indicate Una boots.  No open wounds or weeping.  Pt could benefit from use of compression stockings to reduce edema; please order if this is the desired plan of care. Please re-consult if further assistance is needed.  Thank-you,  Julien Girt MSN, [place], [place], [place], [place]

## 2019-03-02 NOTE — Progress Notes (Signed)
Patient have urinated, will continue to monitor.

## 2019-03-02 NOTE — Progress Notes (Signed)
ID Pt is doing better No sob No fever Appetite poor No diarrhea  Patient Vitals for the past 24 hrs:  BP Temp Temp src Pulse Resp SpO2 Weight  03/02/19 1620 (!) 114/50 98.8 F (37.1 C) Oral 84 19 - -  03/02/19 0744 (!) 114/52 98.4 F (36.9 C) Oral 77 19 100 % -  03/02/19 0315 (!) 113/54 98.1 F (36.7 C) Oral 78 20 99 % 107.2 kg  03/01/19 2028 103/71 97.9 F (36.6 C) Oral 88 20 100 % -   Awake and alert, responding appropriately On nasal cannula o2  Chest b/l air entry Decreased rt base pleurx cath rt chest  Hs irregualr  Abd soft Cns Non focal  labs  CBC Latest Ref Rng & Units 03/01/2019 02/26/2019 02/25/2019  WBC 4.0 - 10.5 K/uL 17.6(H) 18.4(H) 28.2(H)  Hemoglobin 12.0 - 15.0 g/dL 9.4(L) 10.8(L) 10.8(L)  Hematocrit 36.0 - 46.0 % 29.3(L) 33.9(L) 33.8(L)  Platelets 150 - 400 K/uL 324 293 302   CMP Latest Ref Rng & Units 03/02/2019 03/01/2019 02/28/2019  Glucose 70 - 99 mg/dL 130(H) 253(H) 129(H)  BUN 8 - 23 mg/dL 16 13 14   Creatinine 0.44 - 1.00 mg/dL 0.47 0.42(L) 0.52  Sodium 135 - 145 mmol/L 135 132(L) 131(L)  Potassium 3.5 - 5.1 mmol/L 3.8 3.7 3.9  Chloride 98 - 111 mmol/L 98 97(L) 94(L)  CO2 22 - 32 mmol/L 30 30 21(L)  Calcium 8.9 - 10.3 mg/dL 8.8(L) 8.0(L) 8.5(L)  Total Protein 6.5 - 8.1 g/dL - - -  Total Bilirubin 0.3 - 1.2 mg/dL - - -  Alkaline Phos 38 - 126 U/L - - -  AST 15 - 41 U/L - - -  ALT 0 - 44 U/L - - -    Impression/Recommendation  Newly diagnosed adenocarcinoma of the right lung with pleural effusion.  Status post Pleurx catheter placement.   hypotension following Pleurx catheter placement likely due to hypovolemia.  Her antibiotics were escalated to Vanco and cefepime but with negative blood cultures vancomycin DC'd and cefepime will be stopped today.    Proteus bacteremia-adequately treated.     Klebsiella in the urine has been treated as well.   Leukocytosis combination of malignant effusion, infection and steroids.  Improving  gradually.   Metabolic encephalopathy secondary to hyponatremia has resolved.   A. fib on amiodarone.    COPD Discussed the management with the patient and the pharmacist.  ID will sign off call if needed.

## 2019-03-02 NOTE — Progress Notes (Signed)
[place] at [place] NAME: Cathy Buck    MR#:  [id number]  DATE OF BIRTH:  [birth year]  SUBJECTIVE: Patient is transferred to ICU yesterday after Pleurx catheter placement due to hypotension.  Now blood pressure is normal, patient denies any other complaints.  CHIEF COMPLAINT:   Chief Complaint  Patient presents with  . Altered Mental Status    REVIEW OF SYSTEMS:  Review of Systems  Constitutional: Negative for chills and fever.  HENT: Negative for hearing loss and tinnitus.   Eyes: Negative for blurred vision and double vision.  Respiratory: Negative for cough and shortness of breath.   Cardiovascular: Negative for chest pain and palpitations.  Gastrointestinal: Negative for abdominal pain, heartburn, nausea and vomiting.  Genitourinary: Negative for dysuria and urgency.  Musculoskeletal: Negative for myalgias.  Skin: Negative for itching and rash.  Neurological: Negative for dizziness and headaches.  Psychiatric/Behavioral: Negative for depression and substance abuse.    DRUG ALLERGIES:   Allergies  Allergen Reactions  . Codeine Hives  . Atorvastatin Other (See Comments)  . Citalopram Other (See Comments)    Nausea  . Keflex [Cephalexin] Diarrhea  . Tylenol [Acetaminophen] Hives   VITALS:  Blood pressure (!) 114/52, pulse 77, temperature 98.4 F (36.9 C), temperature source Oral, resp. rate 19, height 5\' 4"  (1.626 m), weight 107.2 kg, SpO2 100 %. PHYSICAL EXAMINATION:  Physical Exam  GENERAL:  78 y.o.-year-old patient lying in the bed with no acute distress.  EYES: Pupils equal, round, reactive to light . No scleral icterus. Extraocular muscles intact.  HEENT: Head atraumatic, normocephalic. Oropharynx and nasopharynx clear.  NECK:  Supple, no jugular venous distention. No thyroid enlargement, no tenderness.  LUNGS: Moderately diminished breath sounds bilaterally, no wheezing, rales,rhonchi or crepitation. No use of  accessory muscles of respiration.  CARDIOVASCULAR: S1, S2 normal. No murmurs, rubs, or gallops.  ABDOMEN: Soft, nontender, nondistended. Bowel sounds present.  EXTREMITIES: No pedal edema, cyanosis, or clubbing.  NEUROLOGIC: Awake not oriented. PSYCHIATRIC: Patient awake not fully oriented  sKIN: No obvious rash, lesion, or ulcer.   LABORATORY PANEL:  Female CBC Recent Labs  Lab 03/01/19 0533  WBC 17.6*  HGB 9.4*  HCT 29.3*  PLT 324   ------------------------------------------------------------------------------------------------------------------ Chemistries  Recent Labs  Lab 02/27/19 0620  03/02/19 0724  NA 134*   < > 135  K 3.7   < > 3.8  CL 94*   < > 98  CO2 33*   < > 30  GLUCOSE 133*   < > 130*  BUN 15   < > 16  CREATININE 0.64   < > 0.47  CALCIUM 8.5*   < > 8.8*  MG 2.2  --   --    < > = values in this interval not displayed.   RADIOLOGY:  No results found. ASSESSMENT AND PLAN:   #Acute metabolic encephalopathy from acute hypoxemic and hypercapnic respiratory failure and , underlying UTI Patient weaned off BiPAP. Also had a UTI Patient mental status is overall stable  #New onset A. fib with RVR-initially with low BP and now hemodynamically stable Rate controlled.  TSH level normal. -RecentTTEcho7/2020 with LVEF 60-65% Started Eliquis, discussed with patient's daughter over the phone.  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  Oncology Dr. Mike Gip has seen the patient -Patient was on concurrent chemotherapy and immunotherapy prior  to this admission -Plan PET scan as outpatient once stable, follow-up with Dr. Tasia Catchings as an outpatient regarding further treatment options once discharged. Patient had Pleurx catheter placement by CT surgery, patient to have daily drainage  of pleural fluid at rehab.  #A Proteus bacteremia, patient will be on cefepime, last dose today. Leukocytosis likely secondary to combination of steroids, infection, malignant effusion, patient has no fever.   #.Type 2 diabetes mellitus Patient takes metformin at home, now on sliding scale insulin with coverage, start metformin at discharge.  DVT prophylaxis-started on Eliquis,  [facility]  Discussed with patient's daughter.  all the records are reviewed and case discussed with Care Management/Social Worker. Management plans discussed with the patient, family and they are in agreement.  CODE STATUS: DNR  TOTAL TIME TAKING CARE OF THIS PATIENT: 36 minutes.   POSSIBLE D/C tomorrow.Epifanio Lesches M.D on 03/02/2019 at 3:32 PM  Between 7am to 6pm - Pager - [phone]  After 6pm go to [email or web address] - Proofreader  [facility] [place] [facility]  Office  [phone]  CC: Primary care physician; Sallee Lange, NP  Note: This dictation was prepared with Dragon dictation along with smaller phrase technology. Any transcriptional errors that result from this process are unintentional.

## 2019-03-02 NOTE — Progress Notes (Signed)
Pharmacy Electrolyte Monitoring Consult:  Pharmacy consulted to assist in monitoring and replacing electrolytes in this 78 y.o. female admitted on 02/18/2019.   Labs:  Sodium (mmol/L)  Date Value  03/02/2019 135   Potassium (mmol/L)  Date Value  03/02/2019 3.8   Magnesium (mg/dL)  Date Value  02/27/2019 2.2   Phosphorus (mg/dL)  Date Value  03/02/2019 1.4 (L)   Calcium (mg/dL)  Date Value  03/02/2019 8.8 (L)   Albumin (g/dL)  Date Value  02/18/2019 2.9 (L)    Assessment/Plan: K 3.8  Phos 1.4  Scr 0.52  Na 135  -Will order sodium phosphate 20 mmol IV x1  F/u electrolytes in am   Pharmacy will continue to monitor and adjust per consult.   Lu Duffel, PharmD, BCPS Clinical Pharmacist 03/02/2019 9:21 AM

## 2019-03-03 LAB — BASIC METABOLIC PANEL
Anion gap: 6 (ref 5–15)
BUN: 15 mg/dL (ref 8–23)
CO2: 28 mmol/L (ref 22–32)
Calcium: 8.4 mg/dL — ABNORMAL LOW (ref 8.9–10.3)
Chloride: 99 mmol/L (ref 98–111)
Creatinine, Ser: 0.46 mg/dL (ref 0.44–1.00)
GFR calc Af Amer: >60 mL/min (ref >60)
GFR calc non Af Amer: >60 mL/min (ref >60)
Glucose, Bld: 121 mg/dL — ABNORMAL HIGH (ref 70–99)
Potassium: 3.5 mmol/L (ref 3.5–5.1)
Sodium: 133 mmol/L — ABNORMAL LOW (ref 135–145)

## 2019-03-03 LAB — GLUCOSE, CAPILLARY
Glucose-Capillary: 119 mg/dL — ABNORMAL HIGH (ref 70–99)
Glucose-Capillary: 160 mg/dL — ABNORMAL HIGH (ref 70–99)
Glucose-Capillary: 118 mg/dL — ABNORMAL HIGH (ref 70–99)
Glucose-Capillary: 164 mg/dL — ABNORMAL HIGH (ref 70–99)

## 2019-03-03 LAB — PHOSPHORUS: Phosphorus: 1.6 mg/dL — ABNORMAL LOW (ref 2.5–4.6)

## 2019-03-03 MED ORDER — OXYCODONE HCL 5 MG PO TABS
5.0000 mg | ORAL_TABLET | ORAL | Status: DC | PRN
  Administered 2019-03-03 – 2019-03-04 (×2): 5 mg via ORAL
  Filled 2019-03-03 (×2): qty 1

## 2019-03-03 MED ORDER — POTASSIUM PHOSPHATES 15 MMOLE/5ML IV SOLN
30.0000 mmol | Freq: Once | INTRAVENOUS | Status: AC
  Administered 2019-03-03: 30 mmol via INTRAVENOUS
  Filled 2019-03-03: qty 10

## 2019-03-03 MED ORDER — ONDANSETRON HCL 4 MG/2ML IJ SOLN
4.0000 mg | Freq: Four times a day (QID) | INTRAMUSCULAR | Status: DC | PRN
  Administered 2019-03-03 – 2019-03-04 (×2): 4 mg via INTRAVENOUS
  Filled 2019-03-03 (×2): qty 2

## 2019-03-03 NOTE — Progress Notes (Signed)
MD notified. Pt complains of nausea no emesis. Orders for zofran received. I will continue to assess.

## 2019-03-03 NOTE — Plan of Care (Signed)
  Problem: Clinical Measurements: Goal: Ability to maintain clinical measurements within normal limits will improve Outcome: Progressing   Problem: Activity: Goal: Risk for activity intolerance will decrease Outcome: Progressing   Problem: Elimination: Goal: Will not experience complications related to bowel motility Outcome: Progressing   Problem: Pain Managment: Goal: General experience of comfort will improve Outcome: Progressing   Problem: Safety: Goal: Ability to remain free from injury will improve Outcome: Progressing   Problem: Skin Integrity: Goal: Risk for impaired skin integrity will decrease Outcome: Progressing

## 2019-03-03 NOTE — Progress Notes (Signed)
Pharmacy Electrolyte Monitoring Consult:  Pharmacy consulted to assist in monitoring and replacing electrolytes in this 78 y.o. female admitted on 02/18/2019.   Labs:  Sodium (mmol/L)  Date Value  03/03/2019 133 (L)   Potassium (mmol/L)  Date Value  03/03/2019 3.5   Magnesium (mg/dL)  Date Value  02/27/2019 2.2   Phosphorus (mg/dL)  Date Value  03/03/2019 1.6 (L)   Calcium (mg/dL)  Date Value  03/03/2019 8.4 (L)   Albumin (g/dL)  Date Value  02/18/2019 2.9 (L)    Assessment/Plan: K 3.5  Phos 1.6  Scr 0.52  Na 135  -Will order potassium phosphate 30 mmol IV x1  F/u electrolytes in am   Pharmacy will continue to monitor and adjust per consult.   Lu Duffel, PharmD, BCPS Clinical Pharmacist 03/03/2019 7:35 AM

## 2019-03-03 NOTE — Progress Notes (Addendum)
[place] at [place] NAME: Cathy Buck    MR#:  [id number]  DATE OF BIRTH:  Jul 31, 1940  Says that she feels very tired today and also complains of fecal incontinence but no abdominal pain.  P.o. intake is poor.  Also complaining of nausea.  CHIEF COMPLAINT:   Chief Complaint  Patient presents with  . Altered Mental Status    REVIEW OF SYSTEMS:  Review of Systems  Constitutional: Negative for chills and fever.  HENT: Negative for hearing loss and tinnitus.   Eyes: Negative for blurred vision and double vision.  Respiratory: Negative for cough and shortness of breath.   Cardiovascular: Negative for chest pain and palpitations.  Gastrointestinal: Negative for abdominal pain, heartburn, nausea and vomiting.  Genitourinary: Negative for dysuria and urgency.  Musculoskeletal: Negative for myalgias.  Skin: Negative for itching and rash.  Neurological: Negative for dizziness and headaches.  Psychiatric/Behavioral: Negative for depression and substance abuse.    DRUG ALLERGIES:   Allergies  Allergen Reactions  . Codeine Hives  . Atorvastatin Other (See Comments)  . Citalopram Other (See Comments)    Nausea  . Keflex [Cephalexin] Diarrhea  . Tylenol [Acetaminophen] Hives   VITALS:  Blood pressure (!) 133/50, pulse 88, temperature 98.7 F (37.1 C), temperature source Oral, resp. rate 18, height 5\' 4"  (1.626 m), weight 106.2 kg, SpO2 100 %. PHYSICAL EXAMINATION:  Physical Exam  GENERAL:  78 y.o.-year-old patient lying in the bed with no acute distress.  EYES: Pupils equal, round, reactive to light . No scleral icterus. Extraocular muscles intact.  HEENT: Head atraumatic, normocephalic. Oropharynx and nasopharynx clear.  NECK:  Supple, no jugular venous distention. No thyroid enlargement, no tenderness.  LUNGS: Moderately diminished breath sounds bilaterally, no wheezing, rales,rhonchi or crepitation. No use of accessory muscles of  respiration.  CARDIOVASCULAR: S1, S2 normal. No murmurs, rubs, or gallops.  ABDOMEN: Soft, nontender, nondistended. Bowel sounds present.  EXTREMITIES: No pedal edema, cyanosis, or clubbing.  NEUROLOGIC: Awake not oriented. PSYCHIATRIC: Patient awake not fully oriented  sKIN: No obvious rash, lesion, or ulcer.   LABORATORY PANEL:  Female CBC Recent Labs  Lab 03/01/19 0533  WBC 17.6*  HGB 9.4*  HCT 29.3*  PLT 324   ------------------------------------------------------------------------------------------------------------------ Chemistries  Recent Labs  Lab 02/27/19 0620  03/03/19 0649  NA 134*   < > 133*  K 3.7   < > 3.5  CL 94*   < > 99  CO2 33*   < > 28  GLUCOSE 133*   < > 121*  BUN 15   < > 15  CREATININE 0.64   < > 0.46  CALCIUM 8.5*   < > 8.4*  MG 2.2  --   --    < > = values in this interval not displayed.   RADIOLOGY:  No results found. ASSESSMENT AND PLAN:   #Acute metabolic encephalopathy from acute hypoxemic and hypercapnic respiratory failure and , underlying UTI P initially required BiPAP but now weaned off, metabolic encephalopathy improved.  Patient is alert, awake, oriented.  #New onset A. fib with RVR-initially with low BP and now hemodynamically stable Rate controlled.  TSH level normal. -RecentTTEcho7/2020 with LVEF 60-65%  continue apixaban, amiodarone.,  Rate controlled.   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  Oncology Dr. Mike Gip has seen the patient -Patient was on concurrent  chemotherapy and immunotherapy prior to this admission -Plan PET scan as outpatient once stable, follow-up with Dr. Tasia Catchings as an outpatient regarding further treatment options once discharged. Patient had Pleurx catheter placement by CT surgery, patient to have daily drainage  of pleural fluid at rehab.  #Proteus bacteremia, Klebsiella in the urine, both are treated.   #.Type 2 diabetes mellitus Patient takes metformin at home, now on sliding scale insulin with coverage, start metformin at discharge. Generalized weakness, poor p.o. intake, feels like she is having diarrhea so we will watch her today, if stable discharge tomorrow.  Patient stool softeners to be used only as needed.  DVT prophylaxis-started on Eliquis,   all the records are reviewed and case discussed with Care Management/Social Worker. Management plans discussed with the patient, family and they are in agreement.  CODE STATUS: DNR  TOTAL TIME TAKING CARE OF THIS PATIENT: 36 minutes.   POSSIBLE D/C tomorrow.Epifanio Lesches M.D on 03/03/2019 at 1:12 PM  Between 7am to 6pm - Pager - [phone]  After 6pm go to [email or web address] - Proofreader  [facility] [place]  Office  [phone]  CC: Primary care physician; Sallee Lange, NP  Note: This dictation was prepared with Dragon dictation along with smaller phrase technology. Any transcriptional errors that result from this process are unintentional.

## 2019-03-03 NOTE — Care Management Important Message (Signed)
Important Message  Patient Details  Name: Cathy Buck [place] MRN: [id number] Date of Birth: 1940-09-27   Medicare Important Message Given:  Yes     Dannette Barbara 03/03/2019, 12:44 PM

## 2019-03-04 ENCOUNTER — Encounter: Payer: Self-pay | Admitting: Oncology

## 2019-03-04 LAB — BASIC METABOLIC PANEL
Anion gap: 6 (ref 5–15)
BUN: 14 mg/dL (ref 8–23)
CO2: 29 mmol/L (ref 22–32)
Calcium: 8.4 mg/dL — ABNORMAL LOW (ref 8.9–10.3)
Chloride: 96 mmol/L — ABNORMAL LOW (ref 98–111)
Creatinine, Ser: 0.57 mg/dL (ref 0.44–1.00)
GFR calc Af Amer: >60 mL/min (ref >60)
GFR calc non Af Amer: >60 mL/min (ref >60)
Glucose, Bld: 131 mg/dL — ABNORMAL HIGH (ref 70–99)
Potassium: 3.8 mmol/L (ref 3.5–5.1)
Sodium: 131 mmol/L — ABNORMAL LOW (ref 135–145)

## 2019-03-04 LAB — BLOOD GAS, ARTERIAL
Acid-Base Excess: 6.6 mmol/L — ABNORMAL HIGH (ref 0.0–2.0)
Bicarbonate: 30.5 mmol/L — ABNORMAL HIGH (ref 20.0–28.0)
Delivery systems: POSITIVE
FIO2: 0.28
O2 Saturation: 91.2 %
Patient temperature: 37
pCO2 arterial: 40 mmHg (ref 32.0–48.0)
pH, Arterial: 7.49 — ABNORMAL HIGH (ref 7.350–7.450)
pO2, Arterial: 56 mmHg — ABNORMAL LOW (ref 83.0–108.0)

## 2019-03-04 LAB — CBC
HCT: 31.8 % — ABNORMAL LOW (ref 36.0–46.0)
Hemoglobin: 9.9 g/dL — ABNORMAL LOW (ref 12.0–15.0)
MCH: 24.4 pg — ABNORMAL LOW (ref 26.0–34.0)
MCHC: 31.1 g/dL (ref 30.0–36.0)
MCV: 78.5 fL — ABNORMAL LOW (ref 80.0–100.0)
Platelets: 333 10*3/uL (ref 150–400)
RBC: 4.05 MIL/uL (ref 3.87–5.11)
RDW: 17.8 % — ABNORMAL HIGH (ref 11.5–15.5)
WBC: 11.6 10*3/uL — ABNORMAL HIGH (ref 4.0–10.5)
nRBC: 0 % (ref 0.0–0.2)

## 2019-03-04 LAB — GLUCOSE, CAPILLARY
Glucose-Capillary: 127 mg/dL — ABNORMAL HIGH (ref 70–99)
Glucose-Capillary: 155 mg/dL — ABNORMAL HIGH (ref 70–99)
Glucose-Capillary: 96 mg/dL (ref 70–99)
Glucose-Capillary: 83 mg/dL (ref 70–99)

## 2019-03-04 LAB — PHOSPHORUS: Phosphorus: 2.1 mg/dL — ABNORMAL LOW (ref 2.5–4.6)

## 2019-03-04 MED ORDER — K PHOS MONO-SOD PHOS DI & MONO 155-852-130 MG PO TABS
500.0000 mg | ORAL_TABLET | ORAL | Status: DC
  Administered 2019-03-04 (×3): 500 mg via ORAL
  Filled 2019-03-04 (×4): qty 2

## 2019-03-04 NOTE — Progress Notes (Signed)
Patient transferred to 106. Report given to [place], Therapist, sports.  Belongings sent with patient. Patient would like to wait until later in the morning to notify family about her move. This RN to call family in the morning with update.

## 2019-03-04 NOTE — Progress Notes (Addendum)
Hematology/Oncology Progress Note [facility] Telephone:([phone] [phone]  Patient Care Team: Sallee Lange, NP as PCP - General (Internal Medicine) Telford Nab, RN as Registered Nurse   Name of the patient: Cathy Buck  [id number]  Nov 01, 1940  Date of visit: 03/04/19   INTERVAL HISTORY-  Patient is lying in the bed comfortably.  No acute overnight event. Has been weaned off oxygen.  Breathing comfortably Denies any pain..  Denies shortness of breath.  Appetite is good  Review of systems- Review of Systems  Unable to perform ROS: Acuity of condition  Constitutional: Positive for fatigue. Negative for appetite change.  Respiratory: Negative for cough and shortness of breath.   Cardiovascular: Negative for chest pain.  Genitourinary: Negative for hematuria.   Skin: Negative for rash.  Neurological: Negative for headaches.  Hematological: Negative for adenopathy.  Psychiatric/Behavioral: Negative for confusion.    Allergies  Allergen Reactions   Codeine Hives   Atorvastatin Other (See Comments)   Citalopram Other (See Comments)    Nausea   Keflex [Cephalexin] Diarrhea   Tylenol [Acetaminophen] Hives    Patient Active Problem List   Diagnosis Date Noted   Cough    Status post thoracentesis    Palliative care by specialist    DNR (do not resuscitate)    Goals of care, counseling/discussion    Absolute anemia    Altered mental status 02/18/2019   Primary malignant neoplasm of lung metastatic to other site [facility])    Pleural effusion    Weakness generalized    Hyponatremia 02/03/2019   HTN (hypertension) 02/03/2019   Diabetes ([place]) 02/03/2019     Past Medical History:  Diagnosis Date   Diabetes mellitus without complication ([place])    Hypertension      Past Surgical History:  Procedure Laterality Date   ABDOMINAL HYSTERECTOMY     CHEST TUBE INSERTION N/A 02/28/2019   Procedure: INSERTION  PLEURAL DRAINAGE CATHETER;  Surgeon: [name], MD;  Location: [facility] ORS;  Service: Thoracic;  Laterality: N/A;   CHOLECYSTECTOMY      Social History   Socioeconomic History   Marital status: Married    Spouse name: Not on file   Number of children: Not on file   Years of education: Not on file   Highest education level: Not on file  Occupational History   Not on file  Social Needs   Financial resource strain: Not hard at all   Food insecurity    Worry: Never true    Inability: Never true   Transportation needs    Medical: No    Non-medical: No  Tobacco Use   Smoking status: Former Smoker   Smokeless tobacco: Never Used  Substance and Sexual Activity   Alcohol use: Not Currently   Drug use: Not Currently   Sexual activity: Not Currently  Lifestyle   Physical activity    Days per week: 0 days    Minutes per session: 0 min   Stress: Not at all  Relationships   Social connections    Talks on phone: Once a week    Gets together: Once a week    Attends religious service: 1 to 4 times per year    Active member of club or organization: No    Attends meetings of clubs or organizations: Never    Relationship status: Married   Intimate partner violence    Fear of current or ex partner: No    Emotionally abused: No  Physically abused: No    Forced sexual activity: No  Other Topics Concern   Not on file  Social History Narrative   Lives at home with husband.  Uses cane, walker and rollator a times     History reviewed. No pertinent family history.   Current Facility-Administered Medications:    0.9 %  sodium chloride infusion, , Intravenous, PRN, Mayo, Pete Pelt, MD, Stopped at 03/01/19 1906   amiodarone (PACERONE) tablet 200 mg, 200 mg, Oral, BID, Mayo, Pete Pelt, MD, 200 mg at 03/04/19 1111   apixaban (ELIQUIS) tablet 5 mg, 5 mg, Oral, BID, Charlett Nose, RPH, 5 mg at 03/04/19 1111   calcium-vitamin D (OSCAL WITH D) 500-200 MG-UNIT  per tablet 1 tablet, 1 tablet, Oral, Q breakfast, Mayo, Pete Pelt, MD, 1 tablet at 03/04/19 1111   feeding supplement (ENSURE ENLIVE) (ENSURE ENLIVE) liquid 237 mL, 237 mL, Oral, BID BM, Mayo, Pete Pelt, MD, 237 mL at 03/02/19 1712   feeding supplement (PRO-STAT SUGAR FREE 64) liquid 30 mL, 30 mL, Oral, Daily, Mayo, Pete Pelt, MD, 30 mL at 03/04/19 1107   ferrous sulfate tablet 325 mg, 325 mg, Oral, BID WC, Mayo, Pete Pelt, MD, 325 mg at 03/04/19 1110   fluticasone furoate-vilanterol (BREO ELLIPTA) 100-25 MCG/INH 1 puff, 1 puff, Inhalation, Daily, Wilhelmina Mcardle, MD, 1 puff at 03/03/19 1043   insulin aspart (novoLOG) injection 0-5 Units, 0-5 Units, Subcutaneous, QHS, Lance Coon, MD, 2 Units at 03/02/19 2139   insulin aspart (novoLOG) injection 0-9 Units, 0-9 Units, Subcutaneous, TID WC, Epifanio Lesches, MD, 2 Units at 03/04/19 1212   ipratropium-albuterol (DUONEB) 0.5-2.5 (3) MG/3ML nebulizer solution 3 mL, 3 mL, Nebulization, Q6H PRN, Wilhelmina Mcardle, MD   morphine 2 MG/ML injection 2 mg, 2 mg, Intravenous, Q3H PRN, Wilhelmina Mcardle, MD   multivitamin with minerals tablet 1 tablet, 1 tablet, Oral, Daily, Mayo, Pete Pelt, MD, 1 tablet at 03/04/19 1111   ondansetron [facility]) injection 4 mg, 4 mg, Intravenous, Q6H PRN, Epifanio Lesches, MD, 4 mg at 03/04/19 1203   oxyCODONE (Oxy IR/ROXICODONE) immediate release tablet 5 mg, 5 mg, Oral, Q4H PRN, Mayo, Pete Pelt, MD, 5 mg at 03/03/19 1856   pantoprazole (PROTONIX) EC tablet 40 mg, 40 mg, Oral, Daily, Mayo, Pete Pelt, MD, 40 mg at 03/04/19 1110   phosphorus (K PHOS NEUTRAL) tablet 500 mg, 500 mg, Oral, Q4H, Benita Gutter, RPH, 500 mg at 03/04/19 1111   polyethylene glycol (MIRALAX / GLYCOLAX) packet 17 g, 17 g, Oral, Daily PRN, Mayo, Pete Pelt, MD   traZODone (DESYREL) tablet 50 mg, 50 mg, Oral, QHS PRN, Mayo, Pete Pelt, MD, 50 mg at 02/22/19 2136   vitamin C (ASCORBIC ACID) tablet 250 mg, 250 mg, Oral, BID, Mayo, Pete Pelt, MD, 250 mg at 03/04/19 1110   Physical exam:  Vitals:   03/03/19 1624 03/03/19 1946 03/04/19 0338 03/04/19 0847  BP: (!) 104/35 (!) 116/44 (!) 126/57 (!) 116/57  Pulse: 92 (!) 101 (!) 101 95  Resp: 20 18 18 20   Temp: 99.9 F (37.7 C) 99.9 F (37.7 C) (!) 100.5 F (38.1 C) 98.8 F (37.1 C)  TempSrc: Oral Oral Oral   SpO2: 94% 92% (!) 89% 95%  Weight:   233 lb 4 oz (105.8 kg)   Height:   5\' 4"  (1.626 m)    Physical Exam  Constitutional: She is oriented to person, place, and time. No distress.  HENT:  Head: Normocephalic and atraumatic.  Mouth/Throat:  No oropharyngeal exudate.  Eyes: Pupils are equal, round, and reactive to light. EOM are normal. No scleral icterus.  Neck: Normal range of motion. Neck supple.  Cardiovascular: Normal rate and regular rhythm.  No murmur heard. Pulmonary/Chest: Effort normal. No respiratory distress. She has no rales. She exhibits no tenderness.  Decreased breath sound bilaterally. Breathing room air comfortably. + pleurx catheter, capped.   Abdominal: Soft. Bowel sounds are normal. She exhibits no distension. There is no abdominal tenderness.  Musculoskeletal: Normal range of motion.        General: No edema.  Neurological: She is alert and oriented to person, place, and time. No cranial nerve deficit. She exhibits normal muscle tone. Coordination normal.  Orientated to herself and place.  Skin: Skin is warm and dry. She is not diaphoretic. No erythema.  Psychiatric: Affect normal.       CMP Latest Ref Rng & Units 03/04/2019  Glucose 70 - 99 mg/dL 131(H)  BUN 8 - 23 mg/dL 14  Creatinine 0.44 - 1.00 mg/dL 0.57  Sodium 135 - 145 mmol/L 131(L)  Potassium 3.5 - 5.1 mmol/L 3.8  Chloride 98 - 111 mmol/L 96(L)  CO2 22 - 32 mmol/L 29  Calcium 8.9 - 10.3 mg/dL 8.4(L)  Total Protein 6.5 - 8.1 g/dL -  Total Bilirubin 0.3 - 1.2 mg/dL -  Alkaline Phos 38 - 126 U/L -  AST 15 - 41 U/L -  ALT 0 - 44 U/L -   CBC Latest Ref Rng & Units  03/04/2019  WBC 4.0 - 10.5 K/uL 11.6(H)  Hemoglobin 12.0 - 15.0 g/dL 9.9(L)  Hematocrit 36.0 - 46.0 % 31.8(L)  Platelets 150 - 400 K/uL 333   RADIOGRAPHIC STUDIES: I have personally reviewed the radiological images as listed and agreed with the findings in the report.  Dg Chest 2 View  Result Date: 02/10/2019 CLINICAL DATA:  Pleural effusion EXAM: CHEST - 2 VIEW COMPARISON:  Two days ago FINDINGS: Right PICC with tip at the right atrium. Haziness in the lower right chest. Hyperinflation with diaphragm flattening. Thickened right hilum. There was adenopathy on preceding chest CT. Normal heart size. IMPRESSION: Unchanged right pleural effusion and lower lobe opacification. Electronically Signed   By: Monte Fantasia M.D.   On: 02/10/2019 10:39   Ct Head Wo Contrast  Result Date: 02/18/2019 CLINICAL DATA:  Altered level of consciousness, unexplained, lethargy, hypotension, diabetes mellitus, hypertension, lung cancer EXAM: CT HEAD WITHOUT CONTRAST TECHNIQUE: Contiguous axial images were obtained from the base of the skull through the vertex without intravenous contrast. Sagittal and coronal MPR images reconstructed from axial data set. COMPARISON:  MR brain 02/07/2019 FINDINGS: Brain: Mild generalized atrophy. Normal ventricular morphology. No midline shift or mass effect.2 dense calcification of falx. No intracranial hemorrhage, mass lesion or evidence of acute infarction. No extra-axial fluid collections. Vascular: No hyperdense vessels Skull: Intact Sinuses/Orbits: Clear Other: N/A IMPRESSION: No acute intracranial abnormalities. Electronically Signed   By: Lavonia Dana M.D.   On: 02/18/2019 11:14   Ct Chest Wo Contrast  Result Date: 02/04/2019 CLINICAL DATA:  Shortness of breath EXAM: CT CHEST WITHOUT CONTRAST TECHNIQUE: Multidetector CT imaging of the chest was performed following the standard protocol without IV contrast. COMPARISON:  Plain film from previous day FINDINGS: Cardiovascular: Somewhat  limited due to lack of IV contrast. Aortic calcifications are noted without significant aneurysmal dilatation. No cardiac enlargement is seen. No pericardial effusion is noted. Mediastinum/Nodes: The esophagus is within normal limits. No definitive hilar adenopathy is noted. There  is a right paratracheal node which measures approximately 16 mm in short axis as well as a subcarinal node which measures 17 mm in short axis. These are likely reactive in nature. Thoracic inlet is within normal limits. Lungs/Pleura: The left lung is well aerated with minimal atelectatic changes in the lingula along the cardiac border. No sizable effusion is noted. The right hemithorax demonstrates a moderate to large pleural effusion increased from the prior exam. Underlying patchy infiltrate is noted throughout the right lung with more marked consolidation in the right lung base. No definitive nodule is noted. Upper Abdomen: Somewhat limited due to lack of IV contrast. No definitive abnormality is noted. Musculoskeletal: Mild degenerative change of the thoracic spine is noted. No acute bony abnormality is seen. IMPRESSION: Moderate to large right-sided pleural effusion with associated consolidation most marked in the right lower lobe. Mild left basilar atelectasis. Prominent mediastinal lymph nodes likely reactive in nature. Aortic Atherosclerosis (ICD10-I70.0). Electronically Signed   By: Inez Catalina M.D.   On: 02/04/2019 15:36   Mr Brain Wo Contrast  Result Date: 02/18/2019 CLINICAL DATA:  Altered level of consciousness with change in speech. History of hypertension diabetes and lung cancer. EXAM: MRI HEAD WITHOUT CONTRAST TECHNIQUE: Multiplanar, multiecho pulse sequences of the brain and surrounding structures were obtained without intravenous contrast. COMPARISON:  CT head 02/18/2019 FINDINGS: Brain: Motion degraded study. Rapid scanning utilized due to motion. Negative for acute infarct. Negative for mass or edema. Ventricle  size normal. No hemorrhage or fluid collection. Scattered small white matter hyperintensities bilaterally. Vascular: Normal arterial flow voids Skull and upper cervical spine: Negative Sinuses/Orbits: Negative Other: None IMPRESSION: Motion degraded study No acute abnormality. Minimal white matter disease which appears chronic and unchanged from prior MRI Electronically Signed   By: Franchot Gallo M.D.   On: 02/18/2019 22:07   Mr Brain Wo Contrast  Result Date: 02/07/2019 CLINICAL DATA:  Initial evaluation for acute confusion, encephalopathy. EXAM: MRI HEAD WITHOUT CONTRAST TECHNIQUE: Multiplanar, multiecho pulse sequences of the brain and surrounding structures were obtained without intravenous contrast. COMPARISON:  None available. FINDINGS: Brain: Examination mildly degraded by motion artifact. Diffuse prominence of the CSF containing spaces compatible with generalized age-related cerebral atrophy. Mild scattered patchy T2/FLAIR hyperintensity within the periventricular deep white matter both cerebral hemispheres, most consistent with chronic microvascular ischemic disease, mild for age. No abnormal foci of restricted diffusion to suggest acute or subacute ischemia. Gray-white matter differentiation maintained. No encephalomalacia to suggest chronic cortical infarction. No foci of susceptibility artifact to suggest acute or chronic intracranial hemorrhage. No mass lesion, midline shift or mass effect. No hydrocephalus. The extra-axial fluid collection. Pituitary gland within normal limits. Midline structures intact. Vascular: Major intracranial vascular flow voids are maintained Skull and upper cervical spine: Craniocervical junction within normal limits. Multilevel degenerative spondylolysis noted within the visualized upper cervical spine without significant stenosis. Bone marrow signal intensity normal. No scalp soft tissue abnormality. Sinuses/Orbits: Globes and orbital soft tissues within normal limits.  Paranasal sinuses are clear. Left mastoid effusion noted. Inner ear structures grossly normal. Other: None. IMPRESSION: 1. No acute intracranial abnormality. 2. Mild age-related cerebral atrophy with chronic microvascular ischemic disease. 3. Left mastoid effusion, of uncertain significance. Correlation with physical exam and symptomatology for possible otomastoiditis recommended. Electronically Signed   By: Jeannine Boga M.D.   On: 02/07/2019 20:33   Dg Chest Right Decubitus  Result Date: 02/14/2019 CLINICAL DATA:  Pleural effusion. EXAM: CHEST - RIGHT DECUBITUS COMPARISON:  02/10/2019. FINDINGS: Right central line in  stable position. Layering moderate sized right pleural effusion is noted. Underlying atelectatic changes are present. No pneumothorax noted. IMPRESSION: Layering moderate sized right pleural effusion. Electronically Signed   By: Marcello Moores  Register   On: 02/14/2019 06:32   Dg Chest Port 1 View  Result Date: 03/01/2019 CLINICAL DATA:  Acute respiratory failure. EXAM: PORTABLE CHEST 1 VIEW COMPARISON:  Radiograph of February 28, 2019. FINDINGS: Stable cardiomediastinal silhouette. Atherosclerosis of thoracic aorta is noted. Right-sided chest tube is unchanged in position. No pneumothorax is noted. Minimal bilateral pleural effusions are noted. Stable bibasilar atelectasis is noted. Bony thorax is unremarkable. IMPRESSION: Stable position of right-sided chest tube without definite pneumothorax. Stable bibasilar subsegmental atelectasis is noted with minimal pleural effusions. Aortic Atherosclerosis (ICD10-I70.0). Electronically Signed   By: Marijo Conception M.D.   On: 03/01/2019 09:12   Dg Chest Port 1 View  Result Date: 02/28/2019 CLINICAL DATA:  Chest tube placement EXAM: PORTABLE CHEST 1 VIEW COMPARISON:  Chest x-ray dated 02/21/2019. FINDINGS: RIGHT-sided chest tube in place with tip directed towards the medial aspects of the RIGHT upper lung. Stable opacities at the RIGHT lung base,  likely residual atelectasis. Probable mild atelectasis at the LEFT lung base. No pneumothorax seen. Heart size and mediastinal contours appear stable. IMPRESSION: RIGHT-sided chest tube in place with tip directed towards the medial aspects of the RIGHT upper lung. No pneumothorax seen. Probable residual atelectasis at the RIGHT lung base. Electronically Signed   By: Franki Cabot M.D.   On: 02/28/2019 15:21   Dg Chest Port 1 View  Result Date: 02/21/2019 CLINICAL DATA:  Status post right thoracentesis. EXAM: PORTABLE CHEST 1 VIEW COMPARISON:  Radiographs of February 19, 2019. FINDINGS: Stable cardiomediastinal silhouette. Atherosclerosis of thoracic aorta is noted. No pneumothorax or significant pleural effusion is noted. Left lung is clear. Improved right basilar opacity is noted, with residual atelectasis or infiltrate. Bony thorax is unremarkable. IMPRESSION: No pneumothorax is noted. No significant pleural effusion is noted. Mild right basilar opacity is noted as described above. Electronically Signed   By: Marijo Conception M.D.   On: 02/21/2019 12:57   Dg Chest Port 1 View  Result Date: 02/19/2019 CLINICAL DATA:  Shortness of breath. EXAM: PORTABLE CHEST 1 VIEW COMPARISON:  02/18/2019 FINDINGS: Stable heart size and aortic tortuosity. Increased atelectasis of the right lower lung with probable component of moderate right pleural effusion. No overt pulmonary edema. No pneumothorax. IMPRESSION: Increased atelectasis of the right lung with probable component of moderate right pleural effusion. Electronically Signed   By: Aletta Edouard M.D.   On: 02/19/2019 12:56   Dg Chest Portable 1 View  Result Date: 02/18/2019 CLINICAL DATA:  Weakness.  Confusion. EXAM: PORTABLE CHEST 1 VIEW COMPARISON:  Weakness and confusion. FINDINGS: Interval removal of right PICC line. Heart size normal. Pulmonary venous congestion. Progressive right base atelectasis/infiltrate and right-sided pleural effusion. Left base  subsegmental atelectasis again noted. No pneumothorax. IMPRESSION: 1.  Interim removal of right PICC line. 2. Progressive right base atelectasis/infiltrate and right-sided pleural effusion. Mild left base subsegmental atelectasis again noted. Electronically Signed   By: Marcello Moores  Register   On: 02/18/2019 09:54   Dg Chest Port 1 View  Result Date: 02/14/2019 CLINICAL DATA:  History of lung cancer, post right-sided thoracentesis. EXAM: PORTABLE CHEST 1 VIEW COMPARISON:  Ultrasound-guided thoracentesis-02/14/2019; chest radiograph-earlier same day; 01/11/2019; chest CT-02/04/2019 FINDINGS: Grossly unchanged enlarged cardiac silhouette and mediastinal contours with atherosclerotic plaque within thoracic aorta. Stable position of support apparatus. Interval reduction/resolution of right-sided pleural  effusion post thoracentesis. No pneumothorax. Improved aeration of the right lung base with persistent right basilar heterogeneous opacities, likely atelectasis. No new focal airspace opacities. The left hemithorax remains well aerated. No evidence of edema. No acute osseous abnormalities. IMPRESSION: Interval reduction/resolution of right-sided pleural effusion post thoracentesis. No pneumothorax. Electronically Signed   By: Sandi Mariscal M.D.   On: 02/14/2019 10:58   Dg Chest Port 1 View  Result Date: 02/08/2019 CLINICAL DATA:  Followup pleural effusion. EXAM: PORTABLE CHEST 1 VIEW COMPARISON:  02/07/2019 and earlier exams. FINDINGS: Opacity at the right lung base mostly obscures hemidiaphragm consistent with a small effusion and atelectasis. Opacity at the left lung base is less prominent than on the previous day's study, the difference likely due to differences in patient positioning only. There is hazy opacity consistent with a smaller left pleural effusion. No new lung abnormalities. No evidence of pulmonary edema. No pneumothorax. Right-sided PICC is stable. IMPRESSION: 1. No significant change from the most  recent prior study allowing for differences in patient positioning. 2. Small residual right pleural effusion with associated atelectasis. Smaller left pleural effusion with atelectasis. No convincing pulmonary edema. No pneumothorax. Electronically Signed   By: Lajean Manes M.D.   On: 02/08/2019 13:25   Dg Chest Port 1 View  Result Date: 02/07/2019 CLINICAL DATA:  Status post right-sided thoracentesis. EXAM: PORTABLE CHEST 1 VIEW COMPARISON:  02/05/2019 FINDINGS: The heart is enlarged but stable. Stable tortuosity and calcification of the thoracic aorta. The right PICC line is stable. Status post right-sided thoracentesis with interval decrease in size of the right pleural effusion. No postprocedural pneumothorax is identified. Small left pleural effusion and bibasilar atelectasis. IMPRESSION: Status post right-sided thoracentesis with near complete evacuation of the right-sided pleural effusion. No postprocedural pneumothorax. Persistent small left effusion and moderate bibasilar atelectasis. Electronically Signed   By: Marijo Sanes M.D.   On: 02/07/2019 10:33   Dg Chest Port 1 View  Result Date: 02/07/2019 CLINICAL DATA:  Shortness of breath. Patient experiencing increased shortness of breath. EXAM: PORTABLE CHEST 1 VIEW COMPARISON:  Portable chest radiograph 02/05/2019 FINDINGS: Unchanged position of a right-sided PICC with tip projecting over the upper right atrium. May consider withdrawing catheter 2.5-3 cm to place tip cavoatrial junction. The cardiomediastinal silhouette is unchanged. Interval increase in size of a right pleural effusion with increasing right basilar atelectasis. Underlying right basilar pneumonia cannot be excluded. Ill-defined opacity throughout the mid to lower right lung has also increased and may reflect incomplete atelectasis and/or developing pneumonia. Mild left basilar atelectasis is unchanged. No evidence of pneumothorax. Degenerative changes of the spine. IMPRESSION:  Unchanged position of a right PICC with tip projecting over the right atrium. Consider withdrawing catheter 2.5-3 cm to place tip at cavoatrial junction. Interval increase in size of a right pleural effusion with increasing right basilar atelectasis. Underlying right basilar pneumonia cannot be excluded. Interval increase in ill-defined opacity throughout the mid to lower right lung may reflect incomplete atelectasis and/or developing pneumonia. Electronically Signed   By: Kellie Simmering   On: 02/07/2019 08:14   Dg Chest Port 1 View  Result Date: 02/05/2019 CLINICAL DATA:  PICC line placement EXAM: PORTABLE CHEST 1 VIEW COMPARISON:  Portable exam 2225 hours compared to 1631 hours FINDINGS: RIGHT arm PICC line with tip projecting over superior RIGHT atrium. May consider withdrawing catheter 2.5-3.0 cm to place tip at cavoatrial junction. Stable heart size and mediastinal contours. RIGHT pleural effusion and basilar atelectasis with questionable RIGHT perihilar infiltrate. Minimal LEFT  base atelectasis. IMPRESSION: Tip of RIGHT arm PICC line projects over RIGHT atrium; recommend withdrawal 2.5 x 3.0 cm to place tip at approximately the cavoatrial junction. Otherwise no interval change. Electronically Signed   By: Lavonia Dana M.D.   On: 02/05/2019 22:34   Dg Chest Port 1 View  Result Date: 02/05/2019 CLINICAL DATA:  PICC line placement EXAM: PORTABLE CHEST 1 VIEW COMPARISON:  Portable exam 1613 hours compared to 02/03/2019 FINDINGS: RIGHT arm PICC line tip deflects into the azygos arch. Upper normal heart size. Mediastinal contours and pulmonary vascularity normal. Atherosclerotic calcification aorta. RIGHT lung infiltrate with small RIGHT pleural effusion and basilar atelectasis. LEFT lung clear. No pneumothorax or acute osseous findings. IMPRESSION: Tip of RIGHT arm PICC line deflects into the azygos arch, recommend repositioning into SVC. Increased RIGHT lung infiltrates, RIGHT pleural effusion and basilar  atelectasis. Findings called to [facility] in ICU on 02/05/2019 at 1839 hrs. Electronically Signed   By: Lavonia Dana M.D.   On: 02/05/2019 18:40   Dg Chest Portable 1 View  Result Date: 02/03/2019 CLINICAL DATA:  Shortness of breath. EXAM: PORTABLE CHEST 1 VIEW COMPARISON:  None. FINDINGS: The heart size and pulmonary vascularity are normal. Aortic atherosclerosis. Small right pleural effusion. Focal linear atelectasis in the right midzone. No acute bone abnormality. IMPRESSION: 1. Small right pleural effusion. Slight atelectasis in the right midzone. 2. Aortic atherosclerosis. Electronically Signed   By: Lorriane Shire M.D.   On: 02/03/2019 18:36   [place] Ekg Site Rite  Result Date: 02/05/2019 If Site Rite image not attached, placement could not be confirmed due to current cardiac rhythm.  US Thoracentesis Asp Pleural Space W/img Guide  Result Date: 02/21/2019 INDICATION: Pleural effusion. EXAM: ULTRASOUND GUIDED RIGHT THORACENTESIS MEDICATIONS: None. COMPLICATIONS: None immediate. PROCEDURE: An ultrasound guided thoracentesis was thoroughly discussed with the patient and questions answered. The benefits, risks, alternatives and complications were also discussed. The patient understands and wishes to proceed with the procedure. Written consent was obtained. Ultrasound was performed to localize and mark an adequate pocket of fluid in the right chest. The area was then prepped and draped in the normal sterile fashion. 1% Lidocaine was used for local anesthesia. Under ultrasound guidance a 6 French catheter was introduced. Thoracentesis was performed. The catheter was removed and a dressing applied. FINDINGS: A total of approximately 800 cc of bloody fluid was removed. Samples were sent to the laboratory as requested by the clinical team. IMPRESSION: Successful ultrasound guided right thoracentesis yielding 800 cc of bloody pleural fluid. Electronically Signed   By: Marcello Moores  Register   On: 02/21/2019 12:10    US Thoracentesis Asp Pleural Space W/img Guide  Result Date: 02/14/2019 INDICATION: History of lung cancer, now with recurrent symptomatic malignant pleural effusion. Please from ultrasound-guided thoracentesis for diagnostic and therapeutic purposes. EXAM: US THORACENTESIS ASP PLEURAL SPACE W/IMG GUIDE COMPARISON:  Ultrasound-guided thoracentesis-02/07/2019 yielding 1.2 L fluid. Chest radiograph-earlier same day; 02/10/2019; chest CT-02/04/2019 MEDICATIONS: None. COMPLICATIONS: None immediate. TECHNIQUE: Informed written consent was obtained from the the patient's daughter after a discussion of the risks, benefits and alternatives to treatment. A timeout was performed prior to the initiation of the procedure. Patient was positioned left lateral decubitus in her hospital bed with initial ultrasound scanning demonstrating a recurrent moderate to large sized anechoic right-sided pleural effusion. The inferolateral aspect of the right lower chest was prepped and draped in the usual sterile fashion. 1% lidocaine was used for local anesthesia. An ultrasound image was saved for documentation purposes. An  8 Fr Safe-T-Centesis catheter was introduced. The thoracentesis was performed. The catheter was removed and a dressing was applied. The patient tolerated the procedure well without immediate post procedural complication. The patient was escorted to have an upright chest radiograph. FINDINGS: A total of approximately 1.4 liters of serous fluid was removed. Requested samples were sent to the laboratory. IMPRESSION: Successful ultrasound-guided right sided thoracentesis yielding 1.4 liters of pleural fluid. Electronically Signed   By: Sandi Mariscal M.D.   On: 02/14/2019 11:00   US Thoracentesis Asp Pleural Space W/img Guide  Result Date: 02/07/2019 INDICATION: Right pleural effusion, hypertension, diabetes EXAM: ULTRASOUND GUIDED RIGHT THORACENTESIS MEDICATIONS: 1% lidocaine local COMPLICATIONS: None immediate.  PROCEDURE: An ultrasound guided thoracentesis was thoroughly discussed with the patient and questions answered. The benefits, risks, alternatives and complications were also discussed. The patient understands and wishes to proceed with the procedure. Written consent was obtained. Ultrasound was performed to localize and mark an adequate pocket of fluid in the right chest. The area was then prepped and draped in the normal sterile fashion. 1% Lidocaine was used for local anesthesia. Under ultrasound guidance a 6 Fr Safe-T-Centesis catheter was introduced. Thoracentesis was performed. The catheter was removed and a dressing applied. FINDINGS: A total of approximately 1.2 L of amber colored pleural fluid was removed. Samples were sent to the laboratory as requested by the clinical team. IMPRESSION: Successful ultrasound guided right thoracentesis yielding 1.2 L of pleural fluid. Electronically Signed   By: Jerilynn Mages.  Shick M.D.   On: 02/07/2019 10:10    Assessment and plan-  Patient is a 78 y.o. female with newly diagnosed stage IV lung adenocarcinoma currently admitted due to altered mental status.  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 Genevive Bi.   Stable breathing status.  Weaned off oxygen. Anticipate discharge to rehab in the next couple of days. She can follow-up in the clinic after discharge for outpatient PET scan and discussion of immunotherapy if she remains stable outpatient.  #Hyponatremia, secondary to SIADH.  Sodium level stable. #Anemia, hemoglobin stable at 9.9.  Continue oral iron supplementation. #Atrial fibrillation on amiodarone and Eliquis.   thank you for allowing me to participate in the care of this patient.    Earlie Server, MD, PhD Hematology Oncology [facility] Pager- [phone] 03/04/2019

## 2019-03-04 NOTE — Progress Notes (Signed)
PT Cancellation Note  Patient Details Name: Cathy Buck MRN: [id number] DOB: June 04, 1941   Cancelled Treatment:    Reason Eval/Treat Not Completed: Other (comment)  Initiated PT session, started a few reps of light exercises and noticed that pt had a large BM that needed cleaned up.  Notified nursing for clean up, will try back this afternoon as time allows.  Kreg Shropshire, DPT 03/04/2019, 12:38 PM

## 2019-03-04 NOTE — Plan of Care (Signed)
  Problem: Clinical Measurements: Goal: Ability to maintain clinical measurements within normal limits will improve Outcome: Progressing Goal: Respiratory complications will improve Outcome: Progressing Goal: Cardiovascular complication will be avoided Outcome: Progressing   Problem: Elimination: Goal: Will not experience complications related to bowel motility Outcome: Progressing   Problem: Pain Managment: Goal: General experience of comfort will improve Outcome: Progressing   

## 2019-03-04 NOTE — TOC Progression Note (Signed)
Transition of Care [facility]) - Progression Note    Patient Details  Name: Cathy Buck MRN: [id number] Date of Birth: 20-Jan-1941  Transition of Care [facility]) CM/SW Contact  Cecil Cobbs Phone Number: 03/04/2019, 9:27 AM  Clinical Narrative:    Patient still plans to discharge to [facility].  CS[facility] spoke to [facility] and they said they will not have the Pleurx catheter equipment till Monday.  They asked if she can be sent to SNF with equipment from hospital.   Expected Discharge Plan: [place] Barriers to Discharge: Continued Medical Work up  Expected Discharge Plan and Services Expected Discharge Plan: [place]   Discharge Planning Services: CM Consult   Living arrangements for the past 2 months: [place]                                       Social Determinants of Health (SDOH) Interventions    Readmission Risk Interventions Readmission Risk Prevention Plan 02/19/2019  Transportation Screening Complete  PCP or Specialist Appt within 5-7 Days Complete  Home Care Screening Complete  Medication Review (RN CM) Complete  Some recent data might be hidden

## 2019-03-04 NOTE — Progress Notes (Signed)
Physical Therapy Treatment Patient Details Name: Cathy Buck MRN: [id number] DOB: 1941/04/27 Today's Date: 03/04/2019    History of Present Illness 78 year old female admitted from [facility] with altered mental status, she was here last week due to shortness of breath and weakness and was noted to be severely hyponatremic.  PMH: HTN, DM II, GERD, and stage IV adenocarcinoma of the lung  followed by oncology. Evaled by PT 8/8 but transferred to CCU due to respiratory distress the same day, PT orders discontinued. Pt has undergone several thoracentises and transferred to telemetry 8/10. Tentative plan to insert pleural catheter.    PT Comments    PT attempted to see pt X2 earlier today - need BM clean up each time.  Was able to participate on 3rd attempt but was generally quite limited.  She was lethargic though able to reasonably interact and showed good effort t/o the session.  She was weak (R more so than L) and did need extra assist and/or cuing to do most activities.  She showed some fatigue w/o excessive shortness of breath, unable to get good reading with pulse ox, grabbed dynamap and HR was generally ~120 and O2 in upper 80s (nursing notified) t/o most of modest bed exercises.  Showed good effort but considerable limitations to the session and unable to attempt mobility this date.   Follow Up Recommendations  SNF     Equipment Recommendations       Recommendations for Other Services       Precautions / Restrictions Precautions Precautions: Fall Restrictions Weight Bearing Restrictions: No    Mobility  Bed Mobility               General bed mobility comments: Pt was fatigued, weak and vitals all combine to preclude mobility at this time  Transfers                    Ambulation/Gait                 Stairs             Wheelchair Mobility    Modified Rankin (Stroke Patients Only)       Balance                                             Cognition Arousal/Alertness: Lethargic Behavior During Therapy: WFL for tasks assessed/performed Overall Cognitive Status: History of cognitive impairments - at baseline                                        Exercises Total Joint Exercises Ankle Circles/Pumps: Strengthening;Both;10 reps Quad Sets: Strengthening;Both;10 reps Short Arc Quad: AROM;Both;10 reps;Strengthening(stronger on L, no resistance tolerated on R) Heel Slides: AROM;AAROM;Both;10 reps(AAROM on R, light resistance with leg ext b/l (L>R)) Hip ABduction/ADduction: AROM;AAROM;Both;10 reps(AROM on L, AAROM on R) Straight Leg Raises: (unable on R, AAROM on L)    General Comments        Pertinent Vitals/Pain Pain Assessment: No/denies pain    Home Living                      Prior Function            PT Goals (current goals can now be found  in the care plan section) Progress towards PT goals: Not progressing toward goals - comment(pt remains weak, showed poor tolerance)    Frequency    Min 2X/week      PT Plan Current plan remains appropriate    Co-evaluation              AM-PAC PT "6 Clicks" Mobility   Outcome Measure  Help needed turning from your back to your side while in a flat bed without using bedrails?: Total Help needed moving from lying on your back to sitting on the side of a flat bed without using bedrails?: Total Help needed moving to and from a bed to a chair (including a wheelchair)?: Total Help needed standing up from a chair using your arms (e.g., wheelchair or bedside chair)?: Total Help needed to walk in hospital room?: Total Help needed climbing 3-5 steps with a railing? : Total 6 Click Score: 6    End of Session Equipment Utilized During Treatment: (Pt not on O2, sats in high 80s/low 90s, nursing notified) Activity Tolerance: Patient limited by fatigue Patient left: with bed alarm set;with call bell/phone within reach;in  bed;with family/visitor present Nurse Communication: Mobility status(drop in vitals) PT Visit Diagnosis: Muscle weakness (generalized) (M62.81);Difficulty in walking, not elsewhere classified (R26.2)     Time: 7034-0352 PT Time Calculation (min) (ACUTE ONLY): 24 min  Charges:  $Therapeutic Exercise: 23-37 mins                     Kreg Shropshire, DPT 03/04/2019, 5:24 PM

## 2019-03-04 NOTE — Progress Notes (Signed)
Pharmacy Electrolyte Monitoring Consult:  Pharmacy consulted to assist in monitoring and replacing electrolytes in this 78 y.o. female admitted on 02/18/2019.   Labs:  Sodium (mmol/L)  Date Value  03/04/2019 131 (L)   Potassium (mmol/L)  Date Value  03/04/2019 3.8   Magnesium (mg/dL)  Date Value  02/27/2019 2.2   Phosphorus (mg/dL)  Date Value  03/04/2019 2.1 (L)   Calcium (mg/dL)  Date Value  03/04/2019 8.4 (L)   Albumin (g/dL)  Date Value  02/18/2019 2.9 (L)    Assessment/Plan: K 3.8  Phos 2.1  Scr 0.57  Na 131  -Will order oral potassium phosphate neutral 500 mg q4h x4 today  -Plan to follow-up electrolytes in AM   Pharmacy will continue to monitor and adjust per consult.   [place] Resident 03/04/2019 8:15 AM

## 2019-03-04 NOTE — Progress Notes (Signed)
Notified Dr Jannifer Franklin of patient condition.  New orders given

## 2019-03-04 NOTE — Progress Notes (Signed)
Called by RN Margreta Journey to place pt on Bipap. Found pt to be confused, counting and unaware of place and time. Placed pt on Bipap and pt is tol ok. Pt does pull at mask and is reminded to not mess with it. RN aware of pt state. Will continue to check on pt throughout the night.

## 2019-03-04 NOTE — Progress Notes (Signed)
[place] at [place] NAME: Cathy Buck    MR#:  [id number]  DATE OF BIRTH:  13-Aug-1940  Patient is seen at bedside, denies any complaints.  CHIEF COMPLAINT:   Chief Complaint  Patient presents with  . Altered Mental Status  Poor intake, fatigue.  REVIEW OF SYSTEMS:  Review of Systems  Constitutional: Negative for chills and fever.  HENT: Negative for hearing loss and tinnitus.   Eyes: Negative for blurred vision and double vision.  Respiratory: Negative for cough and shortness of breath.   Cardiovascular: Negative for chest pain and palpitations.  Gastrointestinal: Negative for abdominal pain, heartburn, nausea and vomiting.  Genitourinary: Negative for dysuria and urgency.  Musculoskeletal: Negative for myalgias.  Skin: Negative for itching and rash.  Neurological: Negative for dizziness and headaches.  Psychiatric/Behavioral: Negative for depression and substance abuse.    DRUG ALLERGIES:   Allergies  Allergen Reactions  . Codeine Hives  . Atorvastatin Other (See Comments)  . Citalopram Other (See Comments)    Nausea  . Keflex [Cephalexin] Diarrhea  . Tylenol [Acetaminophen] Hives   VITALS:  Blood pressure (!) 116/57, pulse 95, temperature 98.8 F (37.1 C), resp. rate 20, height 5\' 4"  (1.626 m), weight 105.8 kg, SpO2 95 %. PHYSICAL EXAMINATION:  Physical Exam  GENERAL:  78 y.o.-year-old patient lying in the bed with no acute distress.  EYES: Pupils equal, round, reactive to light . No scleral icterus. Extraocular muscles intact.  HEENT: Head atraumatic, normocephalic. Oropharynx and nasopharynx clear.  NECK:  Supple, no jugular venous distention. No thyroid enlargement, no tenderness.  LUNGS: Moderately diminished breath sounds bilaterally, no wheezing, rales,rhonchi or crepitation. No use of accessory muscles of respiration.  CARDIOVASCULAR: S1, S2 normal. No murmurs, rubs, or gallops.  ABDOMEN: Soft, nontender,  nondistended. Bowel sounds present.  EXTREMITIES: No pedal edema, cyanosis, or clubbing.  NEUROLOGIC: Awake not oriented. PSYCHIATRIC: Patient awake not fully oriented  sKIN: No obvious rash, lesion, or ulcer.   LABORATORY PANEL:  Female CBC Recent Labs  Lab 03/04/19 0509  WBC 11.6*  HGB 9.9*  HCT 31.8*  PLT 333   ------------------------------------------------------------------------------------------------------------------ Chemistries  Recent Labs  Lab 02/27/19 0620  03/04/19 0509  NA 134*   < > 131*  K 3.7   < > 3.8  CL 94*   < > 96*  CO2 33*   < > 29  GLUCOSE 133*   < > 131*  BUN 15   < > 14  CREATININE 0.64   < > 0.57  CALCIUM 8.5*   < > 8.4*  MG 2.2  --   --    < > = values in this interval not displayed.   RADIOLOGY:  No results found. ASSESSMENT AND PLAN:   #Acute metabolic encephalopathy from acute hypoxemic and hypercapnic respiratory failure and , underlying UTI P initially required BiPAP but now weaned off, metabolic encephalopathy improved.  Patient is alert, awake, oriented.  #New onset A. fib with RVR-initially with low BP and now hemodynamically stable Rate controlled.  TSH level normal. -RecentTTEcho7/2020 with LVEF 60-65%  continue apixaban, amiodarone.,  Rate controlled.   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  Oncology Dr. Mike Gip has seen the patient -Patient was on concurrent chemotherapy and immunotherapy prior to this admission -Plan PET scan as outpatient once stable, follow-up with Dr. Tasia Catchings as an  outpatient regarding further treatment options once discharged. Patient had Pleurx catheter placement by CT surgery, patient to have daily drainage of pleural fluid at rehab.  #Proteus bacteremia, Klebsiella in the urine, both are treated.    #.Type 2 diabetes mellitus Patient takes metformin at home, now on sliding scale insulin with coverage, start metformin at discharge. Generalized weakness, poor p.o. intake, feels like she is having diarrhea so we will watch her today, if stable discharge tomorrow.  Patient stool softeners to be used only as needed.  DVT prophylaxis-started on Eliquis,    Patient needs COVID testing Sunday so she can be discharged Monday back to [facility] as per my discussion with Josh from case management today. all the records are reviewed and case discussed with Care Management/Social Worker. Management plans discussed with the patient, family and they are in agreement.  CODE STATUS: DNR  TOTAL TIME TAKING CARE OF THIS PATIENT: 36 minutes.   POSSIBLE D/C tomorrow.Epifanio Lesches M.D on 03/04/2019 at 12:53 PM  Between 7am to 6pm - Pager - [phone]  After 6pm go to [email or web address] - Proofreader  [facility]  Office  [phone]  CC: Primary care physician; Sallee Lange, NP  Note: This dictation was prepared with Dragon dictation along with smaller phrase technology. Any transcriptional errors that result from this process are unintentional.

## 2019-03-05 ENCOUNTER — Inpatient Hospital Stay: Payer: Medicare Other

## 2019-03-05 LAB — GASTROINTESTINAL PANEL BY PCR, STOOL (REPLACES STOOL CULTURE)

## 2019-03-05 LAB — CULTURE, BLOOD (ROUTINE X 2)
Culture: NO GROWTH
Culture: NO GROWTH
Special Requests: ADEQUATE
Special Requests: ADEQUATE

## 2019-03-05 LAB — BASIC METABOLIC PANEL
Anion gap: 9 (ref 5–15)
BUN: 19 mg/dL (ref 8–23)
CO2: 27 mmol/L (ref 22–32)
Calcium: 8 mg/dL — ABNORMAL LOW (ref 8.9–10.3)
Chloride: 99 mmol/L (ref 98–111)
Creatinine, Ser: 0.59 mg/dL (ref 0.44–1.00)
GFR calc Af Amer: >60 mL/min (ref >60)
GFR calc non Af Amer: >60 mL/min (ref >60)
Glucose, Bld: 111 mg/dL — ABNORMAL HIGH (ref 70–99)
Potassium: 4 mmol/L (ref 3.5–5.1)
Sodium: 135 mmol/L (ref 135–145)

## 2019-03-05 LAB — URINALYSIS, COMPLETE (UACMP) WITH MICROSCOPIC
Bacteria, UA: NONE SEEN
Bilirubin Urine: NEGATIVE
Glucose, UA: 50 mg/dL — AB
Hgb urine dipstick: NEGATIVE
Ketones, ur: NEGATIVE mg/dL
Leukocytes,Ua: NEGATIVE
Nitrite: NEGATIVE
Protein, ur: 30 mg/dL — AB
Specific Gravity, Urine: 1.02 (ref 1.005–1.030)
pH: 5 (ref 5.0–8.0)

## 2019-03-05 LAB — C DIFFICILE QUICK SCREEN W PCR REFLEX
C Diff antigen: POSITIVE — AB
C Diff interpretation: DETECTED
C Diff toxin: POSITIVE — AB

## 2019-03-05 LAB — GLUCOSE, CAPILLARY
Glucose-Capillary: 111 mg/dL — ABNORMAL HIGH (ref 70–99)
Glucose-Capillary: 176 mg/dL — ABNORMAL HIGH (ref 70–99)
Glucose-Capillary: 172 mg/dL — ABNORMAL HIGH (ref 70–99)
Glucose-Capillary: 164 mg/dL — ABNORMAL HIGH (ref 70–99)

## 2019-03-05 LAB — PHOSPHORUS: Phosphorus: 3.5 mg/dL (ref 2.5–4.6)

## 2019-03-05 MED ORDER — SODIUM CHLORIDE 0.9 % IV SOLN
INTRAVENOUS | Status: DC
  Administered 2019-03-05 – 2019-03-06 (×2): via INTRAVENOUS

## 2019-03-05 MED ORDER — SODIUM CHLORIDE 0.9% FLUSH: 3.0000 mL | INTRAVENOUS | Status: DC | PRN

## 2019-03-05 MED ORDER — VANCOMYCIN 50 MG/ML ORAL SOLUTION
125.0000 mg | Freq: Four times a day (QID) | ORAL | Status: DC
  Administered 2019-03-05 – 2019-03-09 (×15): 125 mg via ORAL
  Filled 2019-03-05 (×18): qty 2.5

## 2019-03-05 MED ORDER — SODIUM CHLORIDE 0.9% FLUSH
3.0000 mL | Freq: Two times a day (BID) | INTRAVENOUS | Status: DC
  Administered 2019-03-05 – 2019-03-09 (×3): 3 mL via INTRAVENOUS

## 2019-03-05 NOTE — Progress Notes (Signed)
Continues to have diarrhea. MD notified with c diff and Gi panel ordered. Enteric contact isolation started.  Monitoring BP with pt asymptomatic. Stool specimen postive for cdiff and e.coli per lab with MD updated and orders obtained. Family updated via tc.

## 2019-03-05 NOTE — Progress Notes (Signed)
[place] at [place] NAME: Cathy Buck    MR#:  [id number]  DATE OF BIRTH:  06-22-1941  Patient is seen at bedside, denies any complaints.  CHIEF COMPLAINT:   Chief Complaint  Patient presents with  . Altered Mental Status  Patient seen and evaluated today Had 3 episodes of diarrhea Watery stool Mild abdominal discomfort Low-grade fever  REVIEW OF SYSTEMS:  Review of Systems  Constitutional: Generalized weakness Low-grade fever HENT: Negative.   Eyes: Negative.   Respiratory: Negative.   Cardiovascular: Negative.   Gastrointestinal: Has diarrhea Genitourinary: Negative.   Musculoskeletal: Negative.   Skin: Negative.   Neurological: Negative.   Endo/Heme/Allergies: Negative.   Psychiatric/Behavioral: Negative.   All other systems reviewed and are negative.  DRUG ALLERGIES:   Allergies  Allergen Reactions  . Codeine Hives  . Atorvastatin Other (See Comments)  . Citalopram Other (See Comments)    Nausea  . Keflex [Cephalexin] Diarrhea  . Tylenol [Acetaminophen] Hives   VITALS:  Blood pressure (!) 153/100, pulse (!) 110, temperature 99.5 F (37.5 C), temperature source Oral, resp. rate 16, height 5\' 4"  (1.626 m), weight 105.8 kg, SpO2 99 %. PHYSICAL EXAMINATION:  Physical Exam  GENERAL:  78 y.o.-year-old patient lying in the bed with no acute distress.  EYES: Pupils equal, round, reactive to light . No scleral icterus. Extraocular muscles intact.  HEENT: Head atraumatic, normocephalic. Oropharynx dry and nasopharynx clear.  NECK:  Supple, no jugular venous distention. No thyroid enlargement, no tenderness.  LUNGS: Moderately diminished breath sounds bilaterally, no wheezing, rales,rhonchi or crepitation. No use of accessory muscles of respiration.  CARDIOVASCULAR: S1, S2 tachycardia noted. No murmurs, rubs, or gallops.  ABDOMEN: Soft, nontender, nondistended. Bowel sounds present.  EXTREMITIES: No pedal edema, cyanosis,  or clubbing.  NEUROLOGIC: Awake not oriented. PSYCHIATRIC: Patient awake not fully oriented  sKIN: No obvious rash, lesion, or ulcer.   LABORATORY PANEL:  Female CBC Recent Labs  Lab 03/04/19 0509  WBC 11.6*  HGB 9.9*  HCT 31.8*  PLT 333   ------------------------------------------------------------------------------------------------------------------ Chemistries  Recent Labs  Lab 02/27/19 0620  03/05/19 0550  NA 134*   < > 135  K 3.7   < > 4.0  CL 94*   < > 99  CO2 33*   < > 27  GLUCOSE 133*   < > 111*  BUN 15   < > 19  CREATININE 0.64   < > 0.59  CALCIUM 8.5*   < > 8.0*  MG 2.2  --   --    < > = values in this interval not displayed.   RADIOLOGY:  Dg Chest Port 1 View  Result Date: 03/05/2019 CLINICAL DATA:  Cough EXAM: PORTABLE CHEST 1 VIEW COMPARISON:  March 01, 2019 FINDINGS: A right-sided PleurX catheter is noted. The last side hole is difficult to visualize on this exam but may may be straddling the chest wall. This is stable from prior study. In there is no definite pneumothorax. There may be a trace right-sided pleural effusion. Appears to be a small to trace left-sided pleural effusion. The heart size is stable. Aortic calcifications are noted. Coarse airspace opacities are noted throughout the right lower lung. IMPRESSION: 1. No pneumothorax. 2. Stable positioning of the right-sided PleurX catheter. 3. Trace to small bilateral pleural effusions. 4. Persistent but stable airspace opacities throughout the right lower lung zone. Electronically Signed   By: Constance Holster M.D.   On: 03/05/2019 01:41  ASSESSMENT AND PLAN:    #Diarrhea Check for stool for C. difficile toxin GI panel Check for norovirus Enteric precautions Once above results are negative we will start PRN Imodium Stop stool softeners if any Stop Ensure  #Acute metabolic encephalopathy from acute hypoxemic and hypercapnic respiratory failure and , underlying UTI Improved Patient initially  required BiPAP but now weaned off, metabolic encephalopathy improved.  Patient is alert, awake, oriented and responds to all verbal commands.  #New onset A. fib with RVR-initially with low BP and now hemodynamically stable Rate controlled.  TSH level normal. -RecentTTEcho7/2020 with LVEF 60-65%  continue apixaban, amiodarone.,  Rate controlled.  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  Oncology Dr. Mike Gip has seen the patient -Patient was on concurrent chemotherapy and immunotherapy prior to this admission -Plan PET scan as outpatient once stable, follow-up with Dr. Tasia Catchings as an outpatient regarding further treatment options once discharged. Patient had Pleurx catheter placement by CT surgery, patient to have daily drainage of pleural fluid at rehab.  #Proteus bacteremia, Klebsiella in the urine, both are treated. Completed antibiotics  #.Type 2 diabetes mellitus Patient takes metformin at home, now on sliding scale insulin with coverage, start metformin at discharge.  # DVT prophylaxis-started on Eliquis,   Patient needs COVID testing Sunday so she can be discharged Monday back to [facility] as per my discussion with case management all the records are reviewed and case discussed with Care Management/Social Worker. Management plans discussed with the patient, family and they are in agreement.  CODE STATUS: DNR  TOTAL TIME TAKING CARE OF THIS PATIENT: 35 minutes.   POSSIBLE D/C tomorrow.Reatha Harps Pyreddy M.D on 03/05/2019 at 10:25 AM  Between 7am to 6pm - Pager - [phone]  After 6pm go to [email or web address] - Proofreader  [facility] [place]  Office  [phone]  CC: Primary care physician; Sallee Lange, NP  Note: This dictation was prepared  with Dragon dictation along with smaller phrase technology. Any transcriptional errors that result from this process are unintentional.

## 2019-03-05 NOTE — Progress Notes (Signed)
Vancomicin po started. Family here and visiting with observation/education on enteric contact precautions.

## 2019-03-05 NOTE — Progress Notes (Signed)
Soft BP noted. MD notified with NS started at 75 ml/hr. Denies co's; generalized weakness noted.

## 2019-03-05 NOTE — Progress Notes (Addendum)
Pharmacy Electrolyte Monitoring Consult:  Pharmacy consulted to assist in monitoring and replacing electrolytes in this 78 y.o. female admitted on 02/18/2019.   Labs:  Sodium (mmol/L)  Date Value  03/05/2019 135   Potassium (mmol/L)  Date Value  03/05/2019 4.0   Magnesium (mg/dL)  Date Value  02/27/2019 2.2   Phosphorus (mg/dL)  Date Value  03/05/2019 3.5   Calcium (mg/dL)  Date Value  03/05/2019 8.0 (L)   Albumin (g/dL)  Date Value  02/18/2019 2.9 (L)    Assessment/Plan: Pharmacy consulted for electrolyte monitoring and replacement.   No replacement warranted at this time.    -Plan to follow-up electrolytes in AM   Pharmacy will continue to monitor and adjust per consult.   Pernell Dupre, PharmD, BCPS Clinical Pharmacist 03/05/2019 6:41 AM

## 2019-03-06 DIAGNOSIS — Z66 Do not resuscitate: Secondary | ICD-10-CM

## 2019-03-06 DIAGNOSIS — A0472 Enterocolitis due to Clostridium difficile, not specified as recurrent: Secondary | ICD-10-CM

## 2019-03-06 DIAGNOSIS — R571 Hypovolemic shock: Secondary | ICD-10-CM

## 2019-03-06 LAB — MAGNESIUM: Magnesium: 2.1 mg/dL (ref 1.7–2.4)

## 2019-03-06 LAB — CBC
HCT: 33.5 % — ABNORMAL LOW (ref 36.0–46.0)
Hemoglobin: 10.9 g/dL — ABNORMAL LOW (ref 12.0–15.0)
MCH: 24.2 pg — ABNORMAL LOW (ref 26.0–34.0)
MCHC: 32.5 g/dL (ref 30.0–36.0)
MCV: 74.4 fL — ABNORMAL LOW (ref 80.0–100.0)
Platelets: 247 10*3/uL (ref 150–400)
RBC: 4.5 MIL/uL (ref 3.87–5.11)
RDW: 18.2 % — ABNORMAL HIGH (ref 11.5–15.5)
WBC: 11.7 10*3/uL — ABNORMAL HIGH (ref 4.0–10.5)
nRBC: 0.2 % (ref 0.0–0.2)

## 2019-03-06 LAB — BASIC METABOLIC PANEL
Anion gap: 11 (ref 5–15)
BUN: 33 mg/dL — ABNORMAL HIGH (ref 8–23)
CO2: 24 mmol/L (ref 22–32)
Calcium: 8.1 mg/dL — ABNORMAL LOW (ref 8.9–10.3)
Chloride: 98 mmol/L (ref 98–111)
Creatinine, Ser: 1.68 mg/dL — ABNORMAL HIGH (ref 0.44–1.00)
GFR calc Af Amer: 33 mL/min — ABNORMAL LOW (ref >60)
GFR calc non Af Amer: 29 mL/min — ABNORMAL LOW (ref >60)
Glucose, Bld: 175 mg/dL — ABNORMAL HIGH (ref 70–99)
Potassium: 4.6 mmol/L (ref 3.5–5.1)
Sodium: 133 mmol/L — ABNORMAL LOW (ref 135–145)

## 2019-03-06 LAB — PHOSPHORUS: Phosphorus: 4.4 mg/dL (ref 2.5–4.6)

## 2019-03-06 LAB — GLUCOSE, CAPILLARY
Glucose-Capillary: 159 mg/dL — ABNORMAL HIGH (ref 70–99)
Glucose-Capillary: 150 mg/dL — ABNORMAL HIGH (ref 70–99)
Glucose-Capillary: 173 mg/dL — ABNORMAL HIGH (ref 70–99)
Glucose-Capillary: 185 mg/dL — ABNORMAL HIGH (ref 70–99)
Glucose-Capillary: 169 mg/dL — ABNORMAL HIGH (ref 70–99)

## 2019-03-06 MED ORDER — SODIUM CHLORIDE 0.9 % IV SOLN
INTRAVENOUS | Status: DC
  Administered 2019-03-06 – 2019-03-07 (×2): via INTRAVENOUS

## 2019-03-06 MED ORDER — AMIODARONE HCL IN DEXTROSE 360-4.14 MG/200ML-% IV SOLN
60.0000 mg/h | INTRAVENOUS | Status: AC
  Administered 2019-03-06: 11:00:00 60 mg/h via INTRAVENOUS
  Filled 2019-03-06: qty 200

## 2019-03-06 MED ORDER — SODIUM CHLORIDE 0.9 % IV BOLUS
1000.0000 mL | Freq: Once | INTRAVENOUS | Status: AC
  Administered 2019-03-06: 09:00:00 1000 mL via INTRAVENOUS

## 2019-03-06 MED ORDER — METOPROLOL TARTRATE 5 MG/5ML IV SOLN: 2.5000 mg | Freq: Once | INTRAVENOUS | Status: DC | PRN

## 2019-03-06 MED ORDER — SODIUM CHLORIDE 0.9 % IV BOLUS: 1000.0000 mL | Freq: Once | INTRAVENOUS | Status: AC

## 2019-03-06 MED ORDER — NOREPINEPHRINE 4 MG/250ML-% IV SOLN: 0.0000 ug/min | INTRAVENOUS | Status: DC

## 2019-03-06 MED ORDER — SODIUM CHLORIDE 0.9 % IV BOLUS
1000.0000 mL | Freq: Once | INTRAVENOUS | Status: AC
  Administered 2019-03-06: 17:00:00 1000 mL via INTRAVENOUS

## 2019-03-06 MED ORDER — SODIUM CHLORIDE 0.9 % IV SOLN: Freq: Once | INTRAVENOUS | Status: DC

## 2019-03-06 MED ORDER — AMIODARONE HCL IN DEXTROSE 360-4.14 MG/200ML-% IV SOLN
30.0000 mg/h | INTRAVENOUS | Status: DC
  Administered 2019-03-06 – 2019-03-07 (×3): 30 mg/h via INTRAVENOUS
  Filled 2019-03-06 (×3): qty 200

## 2019-03-06 MED ORDER — SODIUM CHLORIDE 0.9 % IV BOLUS
500.0000 mL | Freq: Once | INTRAVENOUS | Status: AC
  Administered 2019-03-06: 500 mL via INTRAVENOUS

## 2019-03-06 NOTE — Progress Notes (Signed)
[place] at [place] NAME: Cathy Buck    MR#:  [id number]  DATE OF BIRTH:  02-Jun-1941  Patient is seen at bedside, denies any complaints.  CHIEF COMPLAINT:   Chief Complaint  Patient presents with  . Altered Mental Status  Patient seen and evaluated today Has watery diarrhea Patient has been hypotensive Blood pressure around 58 / 40 millimeters of hg Comfortable on oxygen via nasal cannula IV fluid bolus ordered Watery stool Mild abdominal discomfort Low-grade fever  REVIEW OF SYSTEMS:  Review of Systems  Constitutional: Generalized weakness Low-grade fever HENT: Negative.   Eyes: Negative.   Respiratory: Negative.   Cardiovascular: Negative.   Gastrointestinal: Has diarrhea Genitourinary: Negative.   Musculoskeletal: Negative.   Skin: Negative.   Neurological: Negative.   Endo/Heme/Allergies: Negative.   Psychiatric/Behavioral: Negative.   All other systems reviewed and are negative.  DRUG ALLERGIES:   Allergies  Allergen Reactions  . Codeine Hives  . Atorvastatin Other (See Comments)  . Citalopram Other (See Comments)    Nausea  . Keflex [Cephalexin] Diarrhea  . Tylenol [Acetaminophen] Hives   VITALS:  Blood pressure (!) 118/92, pulse 77, temperature 97.8 F (36.6 C), temperature source Oral, resp. rate (!) 25, height 5\' 4"  (1.626 m), weight 107.3 kg, SpO2 98 %. PHYSICAL EXAMINATION:  Physical Exam  GENERAL:  78 y.o.-year-old patient lying in the bed with no acute distress.  EYES: Pupils equal, round, reactive to light . No scleral icterus. Extraocular muscles intact.  HEENT: Head atraumatic, normocephalic. Oropharynx dry and nasopharynx clear.  NECK:  Supple, no jugular venous distention. No thyroid enlargement, no tenderness.  LUNGS: Moderately diminished breath sounds bilaterally, no wheezing, rales,rhonchi or crepitation. No use of accessory muscles of respiration.  CARDIOVASCULAR: S1, S2 tachycardia noted.  No murmurs, rubs, or gallops.  ABDOMEN: Soft, nontender, nondistended. Bowel sounds present.  EXTREMITIES: No pedal edema, cyanosis, or clubbing.  NEUROLOGIC: Awake not oriented. PSYCHIATRIC: Patient awake not fully oriented  sKIN: No obvious rash, lesion, or ulcer.   LABORATORY PANEL:  Female CBC Recent Labs  Lab 03/06/19 0742  WBC 11.7*  HGB 10.9*  HCT 33.5*  PLT 247   ------------------------------------------------------------------------------------------------------------------ Chemistries  Recent Labs  Lab 03/06/19 0633  NA 133*  K 4.6  CL 98  CO2 24  GLUCOSE 175*  BUN 33*  CREATININE 1.68*  CALCIUM 8.1*  MG 2.1   RADIOLOGY:  No results found. ASSESSMENT AND PLAN:    #Acute C. difficile colitis Oral vancomycin antibiotic GI panel Check for norovirus Enteric precautions Stop stool softeners if any Stop Ensure  #Sepsis secondary to C. difficile colitis IV fluids Antibiotics and pressors if needed  #Severe hypotension Secondary to sepsis IV fluid bolus Transferred to stepdown start patient on IV Levophed drip if needed for support of blood pressure Intensivist consult  #Acute metabolic encephalopathy from acute hypoxemic and hypercapnic respiratory failure and , underlying UTI Improved Patient initially required BiPAP but now weaned off, metabolic encephalopathy improved.  Patient is alert, awake, oriented and responds to all verbal commands.  #New onset A. fib with RVR-initially with low BP and now hemodynamically stable Rate controlled.  TSH level normal. -RecentTTEcho7/2020 with LVEF 60-65%  continue apixaban, amiodarone.,  Rate controlled.  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  Oncology Dr. Mike Gip has seen the patient -Patient was on  concurrent chemotherapy  and immunotherapy prior to this admission -Plan PET scan as outpatient once stable, follow-up with Dr. Tasia Catchings as an outpatient regarding further treatment options once discharged. Patient had Pleurx catheter placement by CT surgery, patient to have daily drainage of pleural fluid at rehab.  #Proteus bacteremia, Klebsiella in the urine, both are treated. Completed antibiotics  #.Type 2 diabetes mellitus Patient takes metformin at home, now on sliding scale insulin with coverage, start metformin at discharge.  # DVT prophylaxis-started on Eliquis,   #Family updated  Patient needs COVID testing Sunday so she can be [facility] as per my discussion with case management all the records are reviewed and case discussed with Care Management/Social Worker. Management plans discussed with the patient, family and they are in agreement.  CODE STATUS: DNR  TOTAL CRITICAL CARE TIME TAKING CARE OF THIS PATIENT: 52 minutes.   POSSIBLE D/C tomorrow.Reatha Harps Darrek Leasure M.D on 03/06/2019 at 10:26 AM  Between 7am to 6pm - Pager - [phone]  After 6pm go to [email or web address] - Proofreader  [facility]  Office  [phone]  CC: Primary care physician; Sallee Lange, NP  Note: This dictation was prepared with Dragon dictation along with smaller phrase technology. Any transcriptional errors that result from this process are unintentional.

## 2019-03-06 NOTE — Progress Notes (Signed)
Bipap refused 

## 2019-03-06 NOTE — Progress Notes (Addendum)
Pt alert; denies pain/SOB. Generalized weakness. Hypotension/tachycardia with MD notified and in room. NS bolus started. Meds given as ordered. Telemetry applied. Dr. Estanislado Pandy has contacted husband. Awaiting bed for transfer to stepdown. Pt is DNR.

## 2019-03-06 NOTE — Progress Notes (Signed)
Pharmacy Electrolyte Monitoring Consult:  Pharmacy consulted to assist in monitoring and replacing electrolytes in this 78 y.o. female admitted on 02/18/2019.   Labs:  Sodium (mmol/L)  Date Value  03/06/2019 133 (L)   Potassium (mmol/L)  Date Value  03/06/2019 4.6   Magnesium (mg/dL)  Date Value  03/06/2019 2.1   Phosphorus (mg/dL)  Date Value  03/06/2019 4.4   Calcium (mg/dL)  Date Value  03/06/2019 8.1 (L)   Albumin (g/dL)  Date Value  02/18/2019 2.9 (L)    Assessment/Plan: Pharmacy consulted for electrolyte monitoring and replacement.   No replacement warranted at this time.    Plan to follow-up electrolytes in AM   Pharmacy will continue to monitor and adjust per consult.   Olivia Canter [facility] Clinical Pharmacist 03/06/2019 9:48 AM

## 2019-03-06 NOTE — Progress Notes (Signed)
Just returned from transporting pt  to ICU bed 15. Pt ate some breakfast. Transfer completed.

## 2019-03-06 NOTE — Progress Notes (Addendum)
Interim events: The patient was about to be discharged when he was noted to have severe diarrhea and developed some transient hypotension.  She is well-known to our service.  She has been in the hospital for 16 days and has had several admissions to ICU/stepdown.  He has stage IV carcinoma of the lung.  She was transferred back to stepdown due to hypotension not responding to 1-1/2 L of normal saline.  She has C. difficile colitis.  Subjective: Comfortable on [place] O2, no respiratory distress.  States "I just want to go back to my regular room" also states "I do not want to be here in ICU".No other complaints.   Vitals reviewed: Blood pressure is responding to volume challenge.  Heart rate was in the 150s.  She has known A. fib RVR and is on p.o. amiodarone which she may not be absorbing.   Vitals:   03/06/19 0900 03/06/19 1000 03/06/19 1100 03/06/19 1200  BP: (!) 118/92 (!) 96/54 104/65   Pulse:      Resp: (!) 25 (!) 23 18 (!) 21  Temp: 97.8 F (36.6 C)   98 F (36.7 C)  TempSrc: Oral   Oral  SpO2:    97%  Weight: 107.3 kg     Height: 5\' 4"  (1.626 m)      Gen: NAD, laying flat in bed comfortable without respiratory distress HEENT: NCAT, sclerae icteric Neck: No JVD Lungs: breath distant bilaterally, no wheezes or other adventitious sounds.  Pleurx catheter on the right Cardiovascular: Tachycardic,  irregularly irregular, no murmurs Abdomen: Soft, nontender, hyperactive BS Ext: without clubbing, cyanosis, edema Neuro: grossly intact Skin: No obvious lesions noted, exam limited  BMP Latest Ref Rng & Units 03/06/2019 03/05/2019 03/04/2019  Glucose 70 - 99 mg/dL 175(H) 111(H) 131(H)  BUN 8 - 23 mg/dL 33(H) 19 14  Creatinine 0.44 - 1.00 mg/dL 1.68(H) 0.59 0.57  Sodium 135 - 145 mmol/L 133(L) 135 131(L)  Potassium 3.5 - 5.1 mmol/L 4.6 4.0 3.8  Chloride 98 - 111 mmol/L 98 99 96(L)  CO2 22 - 32 mmol/L 24 27 29   Calcium 8.9 - 10.3 mg/dL 8.1(L) 8.0(L) 8.4(L)   CBC Latest Ref Rng & Units  03/06/2019 03/04/2019 03/01/2019  WBC 4.0 - 10.5 K/uL 11.7(H) 11.6(H) 17.6(H)  Hemoglobin 12.0 - 15.0 g/dL 10.9(L) 9.9(L) 9.4(L)  Hematocrit 36.0 - 46.0 % 33.5(L) 31.8(L) 29.3(L)  Platelets 150 - 400 K/uL 247 333 324   CXR: 8/22 film: R pleural catheter in place.  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 difficile colitis with diarrhea  PLAN/REC: Continue supplemental oxygen as needed Continue Pleurx catheter drainage Continue amiodarone, switch to IV as she may not be absorbing it well due to diarrhea, resume p.o. once able Volume repletion with normal saline as needed Transfer to telemetry unit Vancomycin for C. Difficile Continue DNR/DNI status, consider transition to hospice care.   Discussion: This patient has stage IV carcinoma of the lung, her prognosis overall is very poor.  She does not wish to continue to be admitted to ICU/stepdown.  Recommend discussion with palliative care to transition the patient to hospice care which is what she wishes.  Discussed with Dr. Estanislado Pandy.  Update 16:26 HRS: Patient's daughter is visiting.  Patient is very adamant that she wants to go home she does not want any more aggressive interventions,  she just wants "peace and quiet".  Patient will be transferred to telemetry floor.  Managed to get better control of symptoms of C. difficile and proceed with evaluation for hospice either at the hospice house or home hospice.  Patient specifically does not want any pressors, CPR, intubation, mechanical ventilation or further escalation of care.  We will transition to comfort as appropriate.   After transfer, PCCM will sign off. Please call if we can be of further assistance    C. Derrill Kay, MD [place] PCCM 03/06/2019 3:27 PM

## 2019-03-06 NOTE — Progress Notes (Signed)
Patient with low sats per RN.  Patient was on 4 liters.  Hard to pu sat. BP low, HR irregular however breathing well and in no distress.  Patient has order for bipap.  She was able to tell me she is breathing good and does not want to wear the mask at night because it makes her feel like she can't catch her breath.  BBS clear.  Placed her on a high flow bubble o2 at 7 liters. Sat flashed to 100% so I turned her back down to regular cannula at 4 liters.

## 2019-03-07 DIAGNOSIS — R627 Adult failure to thrive: Secondary | ICD-10-CM

## 2019-03-07 LAB — BASIC METABOLIC PANEL
Anion gap: 11 (ref 5–15)
BUN: 40 mg/dL — ABNORMAL HIGH (ref 8–23)
CO2: 22 mmol/L (ref 22–32)
Calcium: 7.7 mg/dL — ABNORMAL LOW (ref 8.9–10.3)
Chloride: 97 mmol/L — ABNORMAL LOW (ref 98–111)
Creatinine, Ser: 1.81 mg/dL — ABNORMAL HIGH (ref 0.44–1.00)
GFR calc Af Amer: 30 mL/min — ABNORMAL LOW (ref >60)
GFR calc non Af Amer: 26 mL/min — ABNORMAL LOW (ref >60)
Glucose, Bld: 162 mg/dL — ABNORMAL HIGH (ref 70–99)
Potassium: 4.9 mmol/L (ref 3.5–5.1)
Sodium: 130 mmol/L — ABNORMAL LOW (ref 135–145)

## 2019-03-07 LAB — MAGNESIUM: Magnesium: 2.2 mg/dL (ref 1.7–2.4)

## 2019-03-07 LAB — GLUCOSE, CAPILLARY
Glucose-Capillary: 134 mg/dL — ABNORMAL HIGH (ref 70–99)
Glucose-Capillary: 141 mg/dL — ABNORMAL HIGH (ref 70–99)

## 2019-03-07 LAB — PHOSPHORUS: Phosphorus: 4.6 mg/dL (ref 2.5–4.6)

## 2019-03-07 MED ORDER — AMIODARONE HCL 200 MG PO TABS
200.0000 mg | ORAL_TABLET | Freq: Two times a day (BID) | ORAL | Status: DC
  Administered 2019-03-08 – 2019-03-09 (×4): 200 mg via ORAL
  Filled 2019-03-07 (×4): qty 1

## 2019-03-07 MED ORDER — CHLORHEXIDINE GLUCONATE CLOTH 2 % EX PADS
6.0000 | MEDICATED_PAD | Freq: Every day | CUTANEOUS | Status: DC
  Administered 2019-03-08: 10:00:00 6 via TOPICAL

## 2019-03-07 NOTE — TOC Progression Note (Addendum)
Transition of Care [facility]) - Progression Note    Patient Details  Name: LASHA ECHEVERRIA MRN: [id number] Date of Birth: [birth year]  Transition of Care [facility]) CM/SW Contact  Ross Ludwig, [place] Phone Number: 03/07/2019, 6:12 PM  Clinical Narrative:     CSW was informed that patient's daughter is interested in having patient return home with caregivers and possibly hospice services.  Patient's family will make final decision tomorrow.  CSW talked to daughter Roland Rack and informed her that CSW can not provide a list of private care agencies, CSW gave emailed patient's daughter two links at where she can research home agencies.  Expected Discharge Plan: [place] Barriers to Discharge: Continued Medical Work up  Expected Discharge Plan and Services Expected Discharge Plan: [place]   Discharge Planning Services: CM Consult   Living arrangements for the past 2 months: [place]                                       Social Determinants of Health (SDOH) Interventions    Readmission Risk Interventions Readmission Risk Prevention Plan 02/19/2019  Transportation Screening Complete  PCP or Specialist Appt within 5-7 Days Complete  Home Care Screening Complete  Medication Review (RN CM) Complete  Some recent data might be hidden

## 2019-03-07 NOTE — Progress Notes (Signed)
Palliative Medicine RN Note: Rec'd request from PMT NP Wadie Lessen to ask RNCM to provide list of home sitters/CNA services, as family is coming today to pick it up. I called the unit; they do not know who the RNCM is today and gave me the man number for CM; there was no answer, so I left a message. Placed Epic order. No information available on AMION.  Marjie Skiff Ned Kakar, RN, BSN, [facility] Palliative Medicine Team 03/07/2019 11:15 AM Office [phone]

## 2019-03-07 NOTE — Progress Notes (Signed)
Patient ID: Cathy Buck, female   DOB: Aug 15, 1940, 78 y.o.   MRN: [id number]  This NP spoke to daughter Cathy Buck by telephone for ongoing palliative medcine needs and emotional support.  Cathy Buck called me this morning to discuss the patient's current medical situation.  Today is day 18 of the patient's hospitalization and family have come to an understanding that it is time to shift to a full comfort path, focusing on comfort and dignity, and allowing for a natural death.   Plan of Care: -DNR/DNI -Focus of care is comfort, quality and dignity -No artificial feeding or hydration now or in the future -No escalation of care -No further diagnostics, no  lab draws -Begin to minimize medical interventions in preparation for transition of care whether that be home with hospice versus residential hospice -All medications by oral route/discussed with Dr. Estanislado Pandy converting amiodarone to oral -family  is requesting assistance with securing home health agency for 24/7 care--please provide the list of agentcies   We discussed hospice benefit at home versus residential.  Prognosis is likely less than a few weeks.  Family is working to put logistics in place for transition of care.  Discussed again with daughter the seriousness of the current medical situation and the high risk for decompensation.  She verbalizes understanding.   Questions and concerns addressed    Family encouraged to call with questions or concerns.  Total time spent was 60  Minutes   Discussed with bedside nurse and Dr. Estanislado Pandy Greater than 50% of the time was spent in counseling and coordination of care  Wadie Lessen NP  Palliative Medicine Team Team Phone # [phone] Pager [phone]

## 2019-03-07 NOTE — Progress Notes (Signed)
[place] at [place] NAME: Cathy Buck    MR#:  [id number]  DATE OF BIRTH:  10-22-1940  Patient is seen at bedside, denies any complaints.  CHIEF COMPLAINT:   Chief Complaint  Patient presents with  . Altered Mental Status  Patient seen and evaluated today Transferred back to telemetry Abdominal discomfort better Diarrhea improving Has borderline blood pressure  REVIEW OF SYSTEMS:  Review of Systems  Constitutional: Generalized weakness Low-grade fever HENT: Negative.   Eyes: Negative.   Respiratory: Negative.   Cardiovascular: Negative.   Gastrointestinal: Has decreased diarrhea Genitourinary: Negative.   Musculoskeletal: Negative.   Skin: Negative.   Neurological: Negative.   Endo/Heme/Allergies: Negative.   Psychiatric/Behavioral: Negative.   All other systems reviewed and are negative.  DRUG ALLERGIES:   Allergies  Allergen Reactions  . Codeine Hives  . Atorvastatin Other (See Comments)  . Citalopram Other (See Comments)    Nausea  . Keflex [Cephalexin] Diarrhea  . Tylenol [Acetaminophen] Hives   VITALS:  Blood pressure (!) 84/49, pulse (!) 135, temperature 97.9 F (36.6 C), resp. rate 20, height 5\' 4"  (1.626 m), weight 111.9 kg, SpO2 (!) 80 %. PHYSICAL EXAMINATION:  Physical Exam  GENERAL:  78 y.o.-year-old patient lying in the bed with no acute distress.  EYES: Pupils equal, round, reactive to light . No scleral icterus. Extraocular muscles intact.  HEENT: Head atraumatic, normocephalic. Oropharynx dry and nasopharynx clear.  NECK:  Supple, no jugular venous distention. No thyroid enlargement, no tenderness.  LUNGS: Moderately diminished breath sounds bilaterally, no wheezing, rales,rhonchi or crepitation. No use of accessory muscles of respiration.  CARDIOVASCULAR: S1, S2 irregular. No murmurs, rubs, or gallops.  ABDOMEN: Soft, nontender, nondistended. Bowel sounds present.  EXTREMITIES: No pedal edema,  cyanosis, or clubbing.  NEUROLOGIC: Awake not oriented. PSYCHIATRIC: Patient awake not fully oriented  sKIN: No obvious rash, lesion, or ulcer.   LABORATORY PANEL:  Female CBC Recent Labs  Lab 03/06/19 0742  WBC 11.7*  HGB 10.9*  HCT 33.5*  PLT 247   ------------------------------------------------------------------------------------------------------------------ Chemistries  Recent Labs  Lab 03/07/19 0456  NA 130*  K 4.9  CL 97*  CO2 22  GLUCOSE 162*  BUN 40*  CREATININE 1.81*  CALCIUM 7.7*  MG 2.2   RADIOLOGY:  No results found. ASSESSMENT AND PLAN:    #Acute C. difficile colitis Oral vancomycin antibiotic to continue  #Sepsis secondary to C. difficile colitis IV fluids as permitted to avoid overload with fluid Antibiotics to continue  #Hypotension Improving With IV fluids to avoid fluid overload Secondary to sepsis  #Acute metabolic encephalopathy from acute hypoxemic and hypercapnic respiratory failure and sepsis Improved  #New onset A. fib with RVR Rate controlled.  TSH level normal. 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  Oncology Dr. Mike Gip has seen the patient -Patient was on concurrent chemotherapy and immunotherapy prior to this admission -Plan PET scan as outpatient once stable, follow-up with Dr. Tasia Catchings as an outpatient regarding further treatment options once discharged. Patient had Pleurx catheter placement by CT surgery, patient to have daily drainage of pleural fluid at rehab.  #Proteus bacteremia, Klebsiella in the urine, both are treated. Completed antibiotics  #.Type 2 diabetes mellitus Patient takes metformin at home, now on  sliding scale insulin with coverage,  start metformin at discharge.  # DVT prophylaxis-started on Eliquis,   #Family updated Palliative care follow-up  all the records are reviewed and case discussed with Care Management/Social Worker. Management plans discussed with the patient, family and they are in agreement.  CODE STATUS: DNR  TOTAL CRITICAL CARE TIME TAKING CARE OF THIS PATIENT: 38 minutes.   POSSIBLE D/C tomorrow.Reatha Harps Pyreddy M.D on 03/07/2019 at 10:22 AM  Between 7am to 6pm - Pager - [phone]  After 6pm go to [email or web address] - Proofreader  [facility] [place] [facility]  Office  [phone]  CC: Primary care physician; Sallee Lange, NP  Note: This dictation was prepared with Dragon dictation along with smaller phrase technology. Any transcriptional errors that result from this process are unintentional.

## 2019-03-07 NOTE — Progress Notes (Signed)
Palliative care had extensive discussion with family Family opted de escalation of aggressive IV therpies Do not want ICU transfers and aggressive therapies Amiodarone infusion stopped Oral amiodarone started Famliy leaning towards home with hospice services and support services Social worker follow up.

## 2019-03-07 NOTE — Progress Notes (Signed)
Pharmacy Electrolyte Monitoring Consult:  Pharmacy consulted to assist in monitoring and replacing electrolytes in this 78 y.o. female admitted on 02/18/2019.   Labs:  Sodium (mmol/L)  Date Value  03/07/2019 130 (L)   Potassium (mmol/L)  Date Value  03/07/2019 4.9   Magnesium (mg/dL)  Date Value  03/07/2019 2.2   Phosphorus (mg/dL)  Date Value  03/07/2019 4.6   Calcium (mg/dL)  Date Value  03/07/2019 7.7 (L)   Albumin (g/dL)  Date Value  02/18/2019 2.9 (L)    Assessment/Plan: Pharmacy consulted for electrolyte monitoring and replacement.   No replacement warranted at this time.    Plan to follow-up electrolytes in AM   Pharmacy will continue to monitor and adjust per consult.   Oswald Hillock, [facility] Clinical Pharmacist 03/07/2019 8:48 AM

## 2019-03-07 NOTE — Progress Notes (Signed)
PT Cancellation Note  Patient Details Name: Cathy Buck MRN: [id number] DOB: Sep 01, 1940   Cancelled Treatment:    Reason Eval/Treat Not Completed: Other (comment).  Chart reviewed.  Discussed pt's status with pt's nurse and SW.  Plan of care pending with Palliative Care per pt's nurse and SW (although purple MEWS score indicating comfort care noted in chart); will hold PT until POC determined.  Leitha Bleak, PT 03/07/19, 2:14 PM [phone]

## 2019-03-08 ENCOUNTER — Telehealth: Payer: Self-pay | Admitting: *Deleted

## 2019-03-08 DIAGNOSIS — L8990 Pressure ulcer of unspecified site, unspecified stage: Secondary | ICD-10-CM | POA: Insufficient documentation

## 2019-03-08 MED ORDER — ZINC OXIDE 40 % EX OINT
TOPICAL_OINTMENT | Freq: Four times a day (QID) | CUTANEOUS | Status: DC
  Administered 2019-03-08: 1 via TOPICAL
  Administered 2019-03-08 – 2019-03-09 (×3): via TOPICAL
  Filled 2019-03-08 (×2): qty 113

## 2019-03-08 NOTE — Consult Note (Signed)
[facility] Nurse wound consult note Patient receiving care in [facility]. Reason for Consult: Incontinence related tissue injury in presence of C-Diff and use of a rectal tube Wound type: enzymatic injury Dressing procedure/placement/frequency: Apply 40% Desitin cream to area affected by C-Diff incontinence.  Apply at minimum 4 times daily and PRN. Monitor the wound area(s) for worsening of condition such as: Signs/symptoms of infection,  Increase in size,  Development of or worsening of odor, Development of pain, or increased pain at the affected locations.  Notify the medical team if any of these develop.  Thank you for the consult.  [facility] nurse will not follow at this time.  Please re-consult the [place] team if needed.  Val Riles, RN, MSN, CWOCN, CNS-BC, pager [phone]

## 2019-03-08 NOTE — Progress Notes (Signed)
PT Cancellation Note  Patient Details Name: Cathy Buck MRN: [id number] DOB: [birth year]   Cancelled Treatment:    Reason Eval/Treat Not Completed: Other (comment).  Chart reviewed.  Per discussion with Palliative Care NP Wadie Lessen, will discontinue current PT order (plan for pt to discharge home with hospice).  Leitha Bleak, PT 03/08/19, 2:31 PM [phone]

## 2019-03-08 NOTE — Telephone Encounter (Signed)
ok 

## 2019-03-08 NOTE — TOC Progression Note (Addendum)
Transition of Care [facility]) - Progression Note    Patient Details  Name: Cathy Buck MRN: [id number] Date of Birth: 01-25-1941  Transition [facility] CM/SW Contact  Katrina Stack, RN Phone Number: 03/08/2019, 12:16 PM  Clinical Narrative:   Spoke with patient's daughter Roland Rack.  She confirms that wish is for patient to discharge home with hospice.  Agency preference is [facility]. Will need oxygen, possibly a wheelchair, hospital bed. Will anticipate need for ems transport home. Patient has not been out of bed this admission.  Updated Craige Cotta with [facility] Attempted to provide [facility] with list of in home caregivers but says she has hired caregivers.    Expected Discharge Plan: [place] Barriers to Discharge: Continued Medical Work up  Expected Discharge Plan and Services Expected Discharge Plan: [place]   Discharge Planning Services: CM Consult   Living arrangements for the past 2 months: [place]                                       Social Determinants of Health (SDOH) Interventions    Readmission Risk Interventions Readmission Risk Prevention Plan 02/19/2019  Transportation Screening Complete  PCP or Specialist Appt within 5-7 Days Complete  Home Care Screening Complete  Medication Review (RN CM) Complete  Some recent data might be hidden

## 2019-03-08 NOTE — Progress Notes (Signed)
Hematology/Oncology Progress Note [facility] [phone] [phone]  Patient Care Team: Sallee Lange, NP as PCP - General (Internal Medicine) Telford Nab, RN as Registered Nurse   Name of the patient: Cathy Buck  [id number]  [birth year]  Date of visit: 03/08/19   INTERVAL HISTORY-  Patient is lying in the bed comfortably.  Appears confused, not able to have meaningful conversation with me. She was transferred to monitor bed due to development of C. difficile colitis.  Review of systems- Review of Systems - Oncology  Allergies  Allergen Reactions   Codeine Hives   Atorvastatin Other (See Comments)   Citalopram Other (See Comments)    Nausea   Keflex [Cephalexin] Diarrhea   Tylenol [Acetaminophen] Hives    Patient Active Problem List   Diagnosis Date Noted   Pressure injury of skin 03/08/2019   Adult failure to thrive    Cough    Status post thoracentesis    Palliative care by specialist    DNR (do not resuscitate)    Goals of care, counseling/discussion    Absolute anemia    Altered mental status 02/18/2019   Primary malignant neoplasm of lung metastatic to other site [facility])    Pleural effusion    Weakness generalized    Hyponatremia 02/03/2019   HTN (hypertension) 02/03/2019   Diabetes ([place]) 02/03/2019     Past Medical History:  Diagnosis Date   Diabetes mellitus without complication ([place])    Hypertension      Past Surgical History:  Procedure Laterality Date   ABDOMINAL HYSTERECTOMY     CHEST TUBE INSERTION N/A 02/28/2019   Procedure: INSERTION PLEURAL DRAINAGE CATHETER;  Surgeon: [name], MD;  Location: [facility] ORS;  Service: Thoracic;  Laterality: N/A;   CHOLECYSTECTOMY      Social History   Socioeconomic History   Marital status: Married    Spouse name: Not on file   Number of children: Not on file   Years of education: Not on file   Highest education level:  Not on file  Occupational History   Not on file  Social Needs   Financial resource strain: Not hard at all   Food insecurity    Worry: Never true    Inability: Never true   Transportation needs    Medical: No    Non-medical: No  Tobacco Use   Smoking status: Former Smoker   Smokeless tobacco: Never Used  Substance and Sexual Activity   Alcohol use: Not Currently   Drug use: Not Currently   Sexual activity: Not Currently  Lifestyle   Physical activity    Days per week: 0 days    Minutes per session: 0 min   Stress: Not at all  Relationships   Social connections    Talks on phone: Once a week    Gets together: Once a week    Attends religious service: 1 to 4 times per year    Active member of club or organization: No    Attends meetings of clubs or organizations: Never    Relationship status: Married   Intimate partner violence    Fear of current or ex partner: No    Emotionally abused: No    Physically abused: No    Forced sexual activity: No  Other Topics Concern   Not on file  Social History Narrative   Lives at home with husband.  Uses cane, walker and rollator a times     History reviewed. No  pertinent family history.   Current Facility-Administered Medications:    0.9 %  sodium chloride infusion, , Intravenous, Continuous, Knox Royalty, NP, Last Rate: 999 mL/hr at 03/06/19 0801   0.9 %  sodium chloride infusion, , Intravenous, Once, Pyreddy, Reatha Harps, MD, Stopped at 03/06/19 0853   0.9 %  sodium chloride infusion, , Intravenous, Continuous, Tyler Pita, MD, Stopped at 03/08/19 1038   amiodarone (PACERONE) tablet 200 mg, 200 mg, Oral, BID, Lance Coon, MD, 200 mg at 03/08/19 1000   apixaban (ELIQUIS) tablet 5 mg, 5 mg, Oral, BID, Charlett Nose, RPH, 5 mg at 03/08/19 1000   Chlorhexidine Gluconate Cloth 2 % PADS 6 each, 6 each, Topical, Daily, Pyreddy, Pavan, MD, 6 each at 03/08/19 1002   fluticasone furoate-vilanterol (BREO  ELLIPTA) 100-25 MCG/INH 1 puff, 1 puff, Inhalation, Daily, Wilhelmina Mcardle, MD, 1 puff at 03/06/19 0744   ipratropium-albuterol (DUONEB) 0.5-2.5 (3) MG/3ML nebulizer solution 3 mL, 3 mL, Nebulization, Q6H PRN, Wilhelmina Mcardle, MD   liver oil-zinc oxide (DESITIN) 40 % ointment, , Topical, QID, Pyreddy, Pavan, MD   metoprolol tartrate (LOPRESSOR) injection 2.5 mg, 2.5 mg, Intravenous, Once PRN, Tyler Pita, MD   ondansetron [facility]) injection 4 mg, 4 mg, Intravenous, Q6H PRN, Epifanio Lesches, MD, 4 mg at 03/04/19 1203   oxyCODONE (Oxy IR/ROXICODONE) immediate release tablet 5 mg, 5 mg, Oral, Q4H PRN, Mayo, Pete Pelt, MD, 5 mg at 03/04/19 1657   sodium chloride flush (NS) 0.9 % injection 3 mL, 3 mL, Intravenous, Q12H, Pyreddy, Pavan, MD, 3 mL at 03/05/19 1104   sodium chloride flush (NS) 0.9 % injection 3 mL, 3 mL, Intravenous, PRN, Pyreddy, Pavan, MD   vancomycin (VANCOCIN) 50 mg/mL oral solution 125 mg, 125 mg, Oral, QID, Pyreddy, Reatha Harps, MD, 125 mg at 03/08/19 1000   Physical exam:  Vitals:   03/07/19 1657 03/07/19 2052 03/08/19 0306 03/08/19 0500  BP: 100/68 (!) 99/48 (!) 88/53   Pulse: 99 64 86   Resp: 19 (!) 21 (!) 24   Temp: 98 F (36.7 C) 98.5 F (36.9 C) 97.6 F (36.4 C)   TempSrc:  Oral Oral   SpO2:   (!) 72%   Weight:    192 lb 3.2 oz (87.2 kg)  Height:       Physical Exam  Constitutional: No distress.  HENT:  Head: Normocephalic.  Mouth/Throat: No oropharyngeal exudate.  Eyes: Pupils are equal, round, and reactive to light. EOM are normal. No scleral icterus.  Neck: Normal range of motion. Neck supple.  Cardiovascular: Normal rate and regular rhythm.  No murmur heard. Pulmonary/Chest: Effort normal. No respiratory distress.  Decreased breath sound bilaterally. Breathing via 4 L of nasal cannula oxygen. + pleurx catheter  Abdominal: Soft. Bowel sounds are normal. She exhibits no distension. There is no abdominal tenderness.  Musculoskeletal: Normal  range of motion.        General: No edema.  Neurological: She is alert.   Confused  Skin: Skin is warm and dry. She is not diaphoretic. No erythema.  Psychiatric: Affect normal.       CMP Latest Ref Rng & Units 03/07/2019  Glucose 70 - 99 mg/dL 162(H)  BUN 8 - 23 mg/dL 40(H)  Creatinine 0.44 - 1.00 mg/dL 1.81(H)  Sodium 135 - 145 mmol/L 130(L)  Potassium 3.5 - 5.1 mmol/L 4.9  Chloride 98 - 111 mmol/L 97(L)  CO2 22 - 32 mmol/L 22  Calcium 8.9 - 10.3 mg/dL 7.7(L)  Total Protein  6.5 - 8.1 g/dL -  Total Bilirubin 0.3 - 1.2 mg/dL -  Alkaline Phos 38 - 126 U/L -  AST 15 - 41 U/L -  ALT 0 - 44 U/L -   CBC Latest Ref Rng & Units 03/06/2019  WBC 4.0 - 10.5 K/uL 11.7(H)  Hemoglobin 12.0 - 15.0 g/dL 10.9(L)  Hematocrit 36.0 - 46.0 % 33.5(L)  Platelets 150 - 400 K/uL 247   RADIOGRAPHIC STUDIES: I have personally reviewed the radiological images as listed and agreed with the findings in the report.  Dg Chest 2 View  Result Date: 02/10/2019 CLINICAL DATA:  Pleural effusion EXAM: CHEST - 2 VIEW COMPARISON:  Two days ago FINDINGS: Right PICC with tip at the right atrium. Haziness in the lower right chest. Hyperinflation with diaphragm flattening. Thickened right hilum. There was adenopathy on preceding chest CT. Normal heart size. IMPRESSION: Unchanged right pleural effusion and lower lobe opacification. Electronically Signed   By: Monte Fantasia M.D.   On: 02/10/2019 10:39   Ct Head Wo Contrast  Result Date: 02/18/2019 CLINICAL DATA:  Altered level of consciousness, unexplained, lethargy, hypotension, diabetes mellitus, hypertension, lung cancer EXAM: CT HEAD WITHOUT CONTRAST TECHNIQUE: Contiguous axial images were obtained from the base of the skull through the vertex without intravenous contrast. Sagittal and coronal MPR images reconstructed from axial data set. COMPARISON:  MR brain 02/07/2019 FINDINGS: Brain: Mild generalized atrophy. Normal ventricular morphology. No midline shift or mass  effect.2 dense calcification of falx. No intracranial hemorrhage, mass lesion or evidence of acute infarction. No extra-axial fluid collections. Vascular: No hyperdense vessels Skull: Intact Sinuses/Orbits: Clear Other: N/A IMPRESSION: No acute intracranial abnormalities. Electronically Signed   By: Lavonia Dana M.D.   On: 02/18/2019 11:14   Mr Brain Wo Contrast  Result Date: 02/18/2019 CLINICAL DATA:  Altered level of consciousness with change in speech. History of hypertension diabetes and lung cancer. EXAM: MRI HEAD WITHOUT CONTRAST TECHNIQUE: Multiplanar, multiecho pulse sequences of the brain and surrounding structures were obtained without intravenous contrast. COMPARISON:  CT head 02/18/2019 FINDINGS: Brain: Motion degraded study. Rapid scanning utilized due to motion. Negative for acute infarct. Negative for mass or edema. Ventricle size normal. No hemorrhage or fluid collection. Scattered small white matter hyperintensities bilaterally. Vascular: Normal arterial flow voids Skull and upper cervical spine: Negative Sinuses/Orbits: Negative Other: None IMPRESSION: Motion degraded study No acute abnormality. Minimal white matter disease which appears chronic and unchanged from prior MRI Electronically Signed   By: Franchot Gallo M.D.   On: 02/18/2019 22:07   Mr Brain Wo Contrast  Result Date: 02/07/2019 CLINICAL DATA:  Initial evaluation for acute confusion, encephalopathy. EXAM: MRI HEAD WITHOUT CONTRAST TECHNIQUE: Multiplanar, multiecho pulse sequences of the brain and surrounding structures were obtained without intravenous contrast. COMPARISON:  None available. FINDINGS: Brain: Examination mildly degraded by motion artifact. Diffuse prominence of the CSF containing spaces compatible with generalized age-related cerebral atrophy. Mild scattered patchy T2/FLAIR hyperintensity within the periventricular deep white matter both cerebral hemispheres, most consistent with chronic microvascular ischemic  disease, mild for age. No abnormal foci of restricted diffusion to suggest acute or subacute ischemia. Gray-white matter differentiation maintained. No encephalomalacia to suggest chronic cortical infarction. No foci of susceptibility artifact to suggest acute or chronic intracranial hemorrhage. No mass lesion, midline shift or mass effect. No hydrocephalus. The extra-axial fluid collection. Pituitary gland within normal limits. Midline structures intact. Vascular: Major intracranial vascular flow voids are maintained Skull and upper cervical spine: Craniocervical junction within normal limits. Multilevel degenerative  spondylolysis noted within the visualized upper cervical spine without significant stenosis. Bone marrow signal intensity normal. No scalp soft tissue abnormality. Sinuses/Orbits: Globes and orbital soft tissues within normal limits. Paranasal sinuses are clear. Left mastoid effusion noted. Inner ear structures grossly normal. Other: None. IMPRESSION: 1. No acute intracranial abnormality. 2. Mild age-related cerebral atrophy with chronic microvascular ischemic disease. 3. Left mastoid effusion, of uncertain significance. Correlation with physical exam and symptomatology for possible otomastoiditis recommended. Electronically Signed   By: Jeannine Boga M.D.   On: 02/07/2019 20:33   Dg Chest Right Decubitus  Result Date: 02/14/2019 CLINICAL DATA:  Pleural effusion. EXAM: CHEST - RIGHT DECUBITUS COMPARISON:  02/10/2019. FINDINGS: Right central line in stable position. Layering moderate sized right pleural effusion is noted. Underlying atelectatic changes are present. No pneumothorax noted. IMPRESSION: Layering moderate sized right pleural effusion. Electronically Signed   By: Marcello Moores  Register   On: 02/14/2019 06:32   Dg Chest Port 1 View  Result Date: 03/05/2019 CLINICAL DATA:  Cough EXAM: PORTABLE CHEST 1 VIEW COMPARISON:  March 01, 2019 FINDINGS: A right-sided PleurX catheter is noted.  The last side hole is difficult to visualize on this exam but may may be straddling the chest wall. This is stable from prior study. In there is no definite pneumothorax. There may be a trace right-sided pleural effusion. Appears to be a small to trace left-sided pleural effusion. The heart size is stable. Aortic calcifications are noted. Coarse airspace opacities are noted throughout the right lower lung. IMPRESSION: 1. No pneumothorax. 2. Stable positioning of the right-sided PleurX catheter. 3. Trace to small bilateral pleural effusions. 4. Persistent but stable airspace opacities throughout the right lower lung zone. Electronically Signed   By: Constance Holster M.D.   On: 03/05/2019 01:41   Dg Chest Port 1 View  Result Date: 03/01/2019 CLINICAL DATA:  Acute respiratory failure. EXAM: PORTABLE CHEST 1 VIEW COMPARISON:  Radiograph of February 28, 2019. FINDINGS: Stable cardiomediastinal silhouette. Atherosclerosis of thoracic aorta is noted. Right-sided chest tube is unchanged in position. No pneumothorax is noted. Minimal bilateral pleural effusions are noted. Stable bibasilar atelectasis is noted. Bony thorax is unremarkable. IMPRESSION: Stable position of right-sided chest tube without definite pneumothorax. Stable bibasilar subsegmental atelectasis is noted with minimal pleural effusions. Aortic Atherosclerosis (ICD10-I70.0). Electronically Signed   By: Marijo Conception M.D.   On: 03/01/2019 09:12   Dg Chest Port 1 View  Result Date: 02/28/2019 CLINICAL DATA:  Chest tube placement EXAM: PORTABLE CHEST 1 VIEW COMPARISON:  Chest x-ray dated 02/21/2019. FINDINGS: RIGHT-sided chest tube in place with tip directed towards the medial aspects of the RIGHT upper lung. Stable opacities at the RIGHT lung base, likely residual atelectasis. Probable mild atelectasis at the LEFT lung base. No pneumothorax seen. Heart size and mediastinal contours appear stable. IMPRESSION: RIGHT-sided chest tube in place with tip  directed towards the medial aspects of the RIGHT upper lung. No pneumothorax seen. Probable residual atelectasis at the RIGHT lung base. Electronically Signed   By: Franki Cabot M.D.   On: 02/28/2019 15:21   Dg Chest Port 1 View  Result Date: 02/21/2019 CLINICAL DATA:  Status post right thoracentesis. EXAM: PORTABLE CHEST 1 VIEW COMPARISON:  Radiographs of February 19, 2019. FINDINGS: Stable cardiomediastinal silhouette. Atherosclerosis of thoracic aorta is noted. No pneumothorax or significant pleural effusion is noted. Left lung is clear. Improved right basilar opacity is noted, with residual atelectasis or infiltrate. Bony thorax is unremarkable. IMPRESSION: No pneumothorax is noted. No significant pleural  effusion is noted. Mild right basilar opacity is noted as described above. Electronically Signed   By: Marijo Conception M.D.   On: 02/21/2019 12:57   Dg Chest Port 1 View  Result Date: 02/19/2019 CLINICAL DATA:  Shortness of breath. EXAM: PORTABLE CHEST 1 VIEW COMPARISON:  02/18/2019 FINDINGS: Stable heart size and aortic tortuosity. Increased atelectasis of the right lower lung with probable component of moderate right pleural effusion. No overt pulmonary edema. No pneumothorax. IMPRESSION: Increased atelectasis of the right lung with probable component of moderate right pleural effusion. Electronically Signed   By: Aletta Edouard M.D.   On: 02/19/2019 12:56   Dg Chest Portable 1 View  Result Date: 02/18/2019 CLINICAL DATA:  Weakness.  Confusion. EXAM: PORTABLE CHEST 1 VIEW COMPARISON:  Weakness and confusion. FINDINGS: Interval removal of right PICC line. Heart size normal. Pulmonary venous congestion. Progressive right base atelectasis/infiltrate and right-sided pleural effusion. Left base subsegmental atelectasis again noted. No pneumothorax. IMPRESSION: 1.  Interim removal of right PICC line. 2. Progressive right base atelectasis/infiltrate and right-sided pleural effusion. Mild left base  subsegmental atelectasis again noted. Electronically Signed   By: Marcello Moores  Register   On: 02/18/2019 09:54   Dg Chest Port 1 View  Result Date: 02/14/2019 CLINICAL DATA:  History of lung cancer, post right-sided thoracentesis. EXAM: PORTABLE CHEST 1 VIEW COMPARISON:  Ultrasound-guided thoracentesis-02/14/2019; chest radiograph-earlier same day; 01/11/2019; chest CT-02/04/2019 FINDINGS: Grossly unchanged enlarged cardiac silhouette and mediastinal contours with atherosclerotic plaque within thoracic aorta. Stable position of support apparatus. Interval reduction/resolution of right-sided pleural effusion post thoracentesis. No pneumothorax. Improved aeration of the right lung base with persistent right basilar heterogeneous opacities, likely atelectasis. No new focal airspace opacities. The left hemithorax remains well aerated. No evidence of edema. No acute osseous abnormalities. IMPRESSION: Interval reduction/resolution of right-sided pleural effusion post thoracentesis. No pneumothorax. Electronically Signed   By: Sandi Mariscal M.D.   On: 02/14/2019 10:58   Dg Chest Port 1 View  Result Date: 02/08/2019 CLINICAL DATA:  Followup pleural effusion. EXAM: PORTABLE CHEST 1 VIEW COMPARISON:  02/07/2019 and earlier exams. FINDINGS: Opacity at the right lung base mostly obscures hemidiaphragm consistent with a small effusion and atelectasis. Opacity at the left lung base is less prominent than on the previous day's study, the difference likely due to differences in patient positioning only. There is hazy opacity consistent with a smaller left pleural effusion. No new lung abnormalities. No evidence of pulmonary edema. No pneumothorax. Right-sided PICC is stable. IMPRESSION: 1. No significant change from the most recent prior study allowing for differences in patient positioning. 2. Small residual right pleural effusion with associated atelectasis. Smaller left pleural effusion with atelectasis. No convincing pulmonary  edema. No pneumothorax. Electronically Signed   By: Lajean Manes M.D.   On: 02/08/2019 13:25   Dg Chest Port 1 View  Result Date: 02/07/2019 CLINICAL DATA:  Status post right-sided thoracentesis. EXAM: PORTABLE CHEST 1 VIEW COMPARISON:  02/05/2019 FINDINGS: The heart is enlarged but stable. Stable tortuosity and calcification of the thoracic aorta. The right PICC line is stable. Status post right-sided thoracentesis with interval decrease in size of the right pleural effusion. No postprocedural pneumothorax is identified. Small left pleural effusion and bibasilar atelectasis. IMPRESSION: Status post right-sided thoracentesis with near complete evacuation of the right-sided pleural effusion. No postprocedural pneumothorax. Persistent small left effusion and moderate bibasilar atelectasis. Electronically Signed   By: Marijo Sanes M.D.   On: 02/07/2019 10:33   Dg Chest [facility] 1 View  Result  Date: 02/07/2019 CLINICAL DATA:  Shortness of breath. Patient experiencing increased shortness of breath. EXAM: PORTABLE CHEST 1 VIEW COMPARISON:  Portable chest radiograph 02/05/2019 FINDINGS: Unchanged position of a right-sided PICC with tip projecting over the upper right atrium. May consider withdrawing catheter 2.5-3 cm to place tip cavoatrial junction. The cardiomediastinal silhouette is unchanged. Interval increase in size of a right pleural effusion with increasing right basilar atelectasis. Underlying right basilar pneumonia cannot be excluded. Ill-defined opacity throughout the mid to lower right lung has also increased and may reflect incomplete atelectasis and/or developing pneumonia. Mild left basilar atelectasis is unchanged. No evidence of pneumothorax. Degenerative changes of the spine. IMPRESSION: Unchanged position of a right PICC with tip projecting over the right atrium. Consider withdrawing catheter 2.5-3 cm to place tip at cavoatrial junction. Interval increase in size of a right pleural effusion with  increasing right basilar atelectasis. Underlying right basilar pneumonia cannot be excluded. Interval increase in ill-defined opacity throughout the mid to lower right lung may reflect incomplete atelectasis and/or developing pneumonia. Electronically Signed   By: Kellie Simmering   On: 02/07/2019 08:14   US Thoracentesis Asp Pleural Space W/img Guide  Result Date: 02/21/2019 INDICATION: Pleural effusion. EXAM: ULTRASOUND GUIDED RIGHT THORACENTESIS MEDICATIONS: None. COMPLICATIONS: None immediate. PROCEDURE: An ultrasound guided thoracentesis was thoroughly discussed with the patient and questions answered. The benefits, risks, alternatives and complications were also discussed. The patient understands and wishes to proceed with the procedure. Written consent was obtained. Ultrasound was performed to localize and mark an adequate pocket of fluid in the right chest. The area was then prepped and draped in the normal sterile fashion. 1% Lidocaine was used for local anesthesia. Under ultrasound guidance a 6 French catheter was introduced. Thoracentesis was performed. The catheter was removed and a dressing applied. FINDINGS: A total of approximately 800 cc of bloody fluid was removed. Samples were sent to the laboratory as requested by the clinical team. IMPRESSION: Successful ultrasound guided right thoracentesis yielding 800 cc of bloody pleural fluid. 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 Please from ultrasound-guided thoracentesis for diagnostic and therapeutic purposes. EXAM: US THORACENTESIS ASP PLEURAL SPACE W/IMG GUIDE COMPARISON:  Ultrasound-guided thoracentesis-02/07/2019 yielding 1.2 L fluid. Chest radiograph-earlier same day; 02/10/2019; chest CT-02/04/2019 MEDICATIONS: None. COMPLICATIONS: None immediate.  TECHNIQUE: Informed written consent was obtained from the the patient's daughter after a discussion of the risks, benefits and alternatives to treatment. A timeout was performed prior to the initiation of the procedure. Patient was positioned left lateral decubitus in her hospital bed with initial ultrasound scanning demonstrating a recurrent moderate to large sized anechoic right-sided pleural effusion. The inferolateral aspect of the right lower chest was prepped and draped in the usual sterile fashion. 1% lidocaine was used for local anesthesia. An ultrasound image was saved for documentation purposes. An 8 Fr Safe-T-Centesis catheter was introduced. The thoracentesis was performed. The catheter was removed and a dressing was applied. The patient tolerated the procedure well without immediate post procedural complication. The patient was escorted to have an upright chest radiograph. FINDINGS: A total of approximately 1.4 liters of serous fluid was removed. Requested samples were sent to the laboratory. IMPRESSION: Successful ultrasound-guided right sided thoracentesis yielding 1.4 liters of pleural fluid. Electronically Signed   By: Sandi Mariscal M.D.   On: 02/14/2019 11:00  US Thoracentesis Asp Pleural Space W/img Guide  Result Date: 02/07/2019 INDICATION: Right pleural effusion, hypertension, diabetes EXAM: ULTRASOUND GUIDED RIGHT THORACENTESIS MEDICATIONS: 1% lidocaine local COMPLICATIONS: None immediate. PROCEDURE: An ultrasound guided thoracentesis was thoroughly discussed with the patient and questions answered. The benefits, risks, alternatives and complications were also discussed. The patient understands and wishes to proceed with the procedure. Written consent was obtained. Ultrasound was performed to localize and mark an adequate pocket of fluid in the right chest. The area was then prepped and draped in the normal sterile fashion. 1% Lidocaine was used for local anesthesia. Under ultrasound  guidance a 6 Fr Safe-T-Centesis catheter was introduced. Thoracentesis was performed. The catheter was removed and a dressing applied. FINDINGS: A total of approximately 1.2 L of amber colored pleural fluid was removed. Samples were sent to the laboratory as requested by the clinical team. IMPRESSION: Successful ultrasound guided right thoracentesis yielding 1.2 L of pleural fluid. Electronically Signed   By: Jerilynn Mages.  Shick M.D.   On: 02/07/2019 10:10    Assessment and plan-  Patient is a 78 y.o. female with newly diagnosed stage IV lung adenocarcinoma currently admitted due to altered mental status.  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 Genevive Bi.   Looks like her condition has declined.  Patient's daughter has spoken with palliative care and decided to go for hospice.  I think it is reasonable given her diagnosis of incurable stage IV lung cancer, poor performance status, multiple comorbidities. DNR/DNI.  Plan was to discharge to hospice.   Thank you for allowing me to participate in the care of this patient.    Earlie Server, MD, PhD Hematology Oncology [facility] at [facility] Pager- [phone] 03/08/2019

## 2019-03-08 NOTE — Progress Notes (Signed)
[place] at [place] NAME: Molleigh Huot    MR#:  [id number]  DATE OF BIRTH:  May 21, 1941  Patient is seen at bedside, denies any complaints.  CHIEF COMPLAINT:   Chief Complaint  Patient presents with  . Altered Mental Status  Patient seen and evaluated today More formed stool Abdominal discomfort better Heart rate better controlled Has borderline blood pressure  REVIEW OF SYSTEMS:  Review of Systems  Constitutional: Generalized weakness Low-grade fever HENT: Negative.   Eyes: Negative.   Respiratory: Negative.   Cardiovascular: Negative.   Gastrointestinal: Has decreased diarrhea Genitourinary: Negative.   Musculoskeletal: Negative.   Skin: Negative.   Neurological: Negative.   Endo/Heme/Allergies: Negative.   Psychiatric/Behavioral: Negative.   All other systems reviewed and are negative.  DRUG ALLERGIES:   Allergies  Allergen Reactions  . Codeine Hives  . Atorvastatin Other (See Comments)  . Citalopram Other (See Comments)    Nausea  . Keflex [Cephalexin] Diarrhea  . Tylenol [Acetaminophen] Hives   VITALS:  Blood pressure (!) 88/53, pulse 86, temperature 97.6 F (36.4 C), temperature source Oral, resp. rate (!) 24, height 5\' 4"  (1.626 m), weight 87.2 kg, SpO2 (!) 72 %. PHYSICAL EXAMINATION:  Physical Exam  GENERAL:  78 y.o.-year-old patient lying in the bed with no acute distress.  EYES: Pupils equal, round, reactive to light . No scleral icterus. Extraocular muscles intact.  HEENT: Head atraumatic, normocephalic. Oropharynx dry and nasopharynx clear.  NECK:  Supple, no jugular venous distention. No thyroid enlargement, no tenderness.  LUNGS: Moderately diminished breath sounds bilaterally, no wheezing, rales,rhonchi or crepitation. No use of accessory muscles of respiration.  CARDIOVASCULAR: S1, S2 irregular. No murmurs, rubs, or gallops.  ABDOMEN: Soft, nontender, nondistended. Bowel sounds present.  EXTREMITIES:  No pedal edema, cyanosis, or clubbing.  NEUROLOGIC: Awake not oriented. PSYCHIATRIC: Patient awake not fully oriented  sKIN: No obvious rash, lesion, or ulcer.   LABORATORY PANEL:  Female CBC Recent Labs  Lab 03/06/19 0742  WBC 11.7*  HGB 10.9*  HCT 33.5*  PLT 247   ------------------------------------------------------------------------------------------------------------------ Chemistries  Recent Labs  Lab 03/07/19 0456  NA 130*  K 4.9  CL 97*  CO2 22  GLUCOSE 162*  BUN 40*  CREATININE 1.81*  CALCIUM 7.7*  MG 2.2   RADIOLOGY:  No results found. ASSESSMENT AND PLAN:    #Acute C. difficile colitis Improving Oral vancomycin antibiotic to continue  #Sepsis secondary to C. difficile colitis Improved Antibiotics to continue Family does not want any aggressive therapies  #Hypotension Improved Supportive car  #Acute metabolic encephalopathy from acute hypoxemic and hypercapnic respiratory failure and sepsis Improved  #New onset A. fib with RVR Rate controlled.  TSH level normal. 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  Oncology Dr. Mike Gip has seen the patient -Patient was on concurrent chemotherapy and immunotherapy prior to this admission -Plan PET scan as outpatient once stable, follow-up with Dr. Tasia Catchings as an outpatient regarding further treatment options once discharged. Patient had Pleurx catheter placement by CT surgery, patient to have daily drainage of pleural fluid at rehab.  #Proteus bacteremia, Klebsiella in the urine, both are treated. Completed antibiotics  #.Type 2 diabetes mellitus Patient takes metformin at home, now on  sliding scale insulin with coverage, start metformin  at discharge.  # DVT prophylaxis-started on Eliquis,   #Family updated Palliative care follow-up appreciated  #Family leaning towards hospice care at home Possible discharge in a.m. with home hospice services Family does not want any active blood work and aggressive IV therapies  all the records are reviewed and case discussed with Care Management/Social Worker. Management plans discussed with the patient, family and they are in agreement.  CODE STATUS: DNR  TOTAL CRITICAL CARE TIME TAKING CARE OF THIS PATIENT: 35 minutes.   POSSIBLE D/C tomorrow.Reatha Harps  M.D on 03/08/2019 at 10:25 AM  Between 7am to 6pm - Pager - [phone]  After 6pm go to [email or web address] - Proofreader  [facility]  Office  [phone]  CC: Primary care physician; Sallee Lange, NP  Note: This dictation was prepared with Dragon dictation along with smaller phrase technology. Any transcriptional errors that result from this process are unintentional.

## 2019-03-08 NOTE — Progress Notes (Signed)
New referral for [facility] services at home received from [facility]. Patient is a 78 year old woman with a history of HTN, DM II,, GERD, HLD and recent diagnosis of metastatic lung cancer. She as admitted to [facility] on 8/7 from the [facility] for evaluation of lethargy and change in mental status and hypotension. She was admitted for treatment of hyponatremia and new onset A fib. She has had a complicated hospital course and unfortunately continued to decline despite medical interventions. Palliative medicine was consulted and has been following. Her family has chosen to focus on comfort with the support of hospice services at home. Writer spoke on the phone with patient's daughter [name] to initiated eduction regarding hospice services, philosophy and team approach to care with understanding voiced. Writer also notified [name] that at this time due to [place] is unable to provide in person volunteer visits and that virtual visits are available on request. [name] again voiced understanding. DME needs discussed, patient will need oxygen, hospital bed and over bed table, [name] has requested delivery today as plan is or discharge home tomorrow. Patient will require EMS transport with signed out of facility DNR in place. Hospital team updated. Patient information faxed to referral. Contact numbers given to [facility].  Flo Shanks BSN, RN, [facility] [phone]

## 2019-03-08 NOTE — Telephone Encounter (Signed)
Hospice called reporting that patient is discharging from hospital tomorrow and there is a Hospice referral made. Asking if Dr Tasia Catchings will sign orders and serve as attending. Please advise

## 2019-03-08 NOTE — Telephone Encounter (Signed)
[facility]BAL ORDER called to [facility] Triage nurse

## 2019-03-08 NOTE — Progress Notes (Addendum)
Patient ID: Cathy Buck, female   DOB: 1941/01/30, 78 y.o.   MRN: [id number]  This NP spoke to bedside RN;   patient remains weak and with limited oral intake.  Again today spoke to  daughter [name] by telephone for ongoing palliative medcine needs and emotional support.  We spoke to the goals of care being comfort and dignity and allowing for a natural death. Family is planning for patient to transition home with hospice services tomorrow, discussed with Dr Estanislado Pandy.   Plan of Care: -DNR/DNI -Focus of care is comfort, quality and dignity -No artificial feeding or hydration now or in the future -No escalation of care - no desire for OP cancer treatments  -No further diagnostics, no  lab draws -Begin to minimize medical interventions in preparation for transition of care to home with hospice  -All medications by oral route only -family is putting plan together to augment hospice services along with out-of-pocket caregivers and family schedule  Prognosis is likely weeks to months..   Plan is to discharge tomorrow, home with hospice.  [name] is main contact person for logistics of plan  Questions and concerns addressed    Family encouraged to call with questions or concerns.  Total time spent was 35  Minutes   Discussed with bedside nurse and Dr. Estanislado Pandy Greater than 50% of the time was spent in counseling and coordination of care  Wadie Lessen NP  Palliative Medicine Team Team Phone # [phone] Pager [phone]

## 2019-03-08 NOTE — Progress Notes (Signed)
Pharmacy Electrolyte Monitoring Consult:  Pharmacy consulted to assist in monitoring and replacing electrolytes in this 78 y.o. female admitted on 02/18/2019.   Labs:  Sodium (mmol/L)  Date Value  03/07/2019 130 (L)   Potassium (mmol/L)  Date Value  03/07/2019 4.9   Magnesium (mg/dL)  Date Value  03/07/2019 2.2   Phosphorus (mg/dL)  Date Value  03/07/2019 4.6   Calcium (mg/dL)  Date Value  03/07/2019 7.7 (L)   Albumin (g/dL)  Date Value  02/18/2019 2.9 (L)   Corrected Ca 8.6   Assessment/Plan: Pharmacy consulted for electrolyte monitoring and replacement.   No replacement warranted at this time.    Plan to follow-up electrolytes in AM   Pharmacy will continue to monitor and adjust per consult.   Oswald Hillock, [facility] Clinical Pharmacist 03/08/2019 7:27 AM

## 2019-03-08 NOTE — Progress Notes (Signed)
Pharmacy Electrolyte Monitoring Consult:  Pharmacy consulted to assist in monitoring and replacing electrolytes in this 78 y.o. female admitted on 02/18/2019.   Labs:  Sodium (mmol/L)  Date Value  03/07/2019 130 (L)   Potassium (mmol/L)  Date Value  03/07/2019 4.9   Magnesium (mg/dL)  Date Value  03/07/2019 2.2   Phosphorus (mg/dL)  Date Value  03/07/2019 4.6   Calcium (mg/dL)  Date Value  03/07/2019 7.7 (L)   Albumin (g/dL)  Date Value  02/18/2019 2.9 (L)   Corrected Ca 8.6   Assessment/Plan: Pharmacy consulted for electrolyte monitoring and replacement. Pt and family leaning towards hospice services.   No replacement warranted at this time.    Plan to follow-up electrolytes when labs ordered by primary team.    Pharmacy will continue to monitor and adjust per consult.   Oswald Hillock, [facility] Clinical Pharmacist 03/08/2019 7:29 AM

## 2019-03-09 LAB — GLUCOSE, CAPILLARY: Glucose-Capillary: 126 mg/dL — ABNORMAL HIGH (ref 70–99)

## 2019-03-09 MED ORDER — AMIODARONE HCL 200 MG PO TABS: 200.0000 mg | ORAL_TABLET | Freq: Two times a day (BID) | ORAL | 0 refills | Status: AC

## 2019-03-09 MED ORDER — VANCOMYCIN 50 MG/ML ORAL SOLUTION: 125.0000 mg | Freq: Four times a day (QID) | ORAL | 0 refills | Status: AC

## 2019-03-09 NOTE — Progress Notes (Signed)
Patient discharged home today with EMS with Hospice to follow. Daughter called by this RN to review AVS ans care instructions. Instructed daughter to please call if she had any further concerns or questions. No concerns or complaints at this time.

## 2019-03-09 NOTE — Discharge Summary (Signed)
[facility] NAME: Cathy Buck    MR#:  [id number]  DATE OF BIRTH:  1940/08/26  DATE OF ADMISSION:  02/18/2019 ADMITTING PHYSICIAN: Lang Snow, NP  DATE OF DISCHARGE: 03/09/2019  PRIMARY CARE PHYSICIAN: Sallee Lange, NP   ADMISSION DIAGNOSIS:  Pleural effusion [J90] Primary malignant neoplasm of lung metastatic to other site, unspecified laterality ([place]) [C34.90] Altered mental status, unspecified altered mental status type [R41.82]  DISCHARGE DIAGNOSIS:  C. difficile colitis Sepsis Metabolic encephalopathy Hyponatremia Metastatic adenocarcinoma of the lung Proteus bacteremia Pleural effusion New onset atrial fibrillation Acute hypoxic hypercapnic respiratory failure SECONDARY DIAGNOSIS:   Past Medical History:  Diagnosis Date  . Diabetes mellitus without complication ([place])   . 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 Patient was recently discharged on 02/14/2019 following admission for acute hypoxic respiratory failure secondary to right-sided pleural effusion.  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  During her hospital stay she was noted to be encephalopathic secondary to hyponatremia, MRI of the brain at that time was negative for stroke or mets.  She was discharged to follow-up with up with oncology Dr. Tasia Catchings for treatment options of her adenocarcinoma.  Per patient's chart, patient had a follow-up visit with his oncology today and  was noted to be lethargic and incoherent in the office with blood pressure of 87/55 mm Hg, pulse 106, temp 95.7 and resp 24 therefore she was sent to the ED for further evaluation. On arrival to the ED, she was afebrile with blood pressure 133/73 mm Hg and pulse rate 99 beats/min. There were no focal neurological deficits; she was awake but with incomprehensible speech.  Per daughter who is currently at the bedside, patient speech is not at baseline usually she is coherent and speaks clearly without any difficulty.  He has noticed this change in speech with worsening since discharge.  Initial labs revealed sodium 129, potassium 5.2, troponin 53, lactic acid 1.6, WBC 11.3.  UA pending.  Chest x-ray showed progressive right base atelectasis/infiltrate and right-sided pleural effusion.  Noncontrast CT head was obtained and showed no acute intracranial abnormality.  Patient will be admitted for further management.  HOSPITAL COURSE:  Patient was initially admitted to telemetry.  Patient was treated with antibiotics for bacteremia.  Diltiazem was used for rate control for atrial fibrillation along with beta-blocker.  Patient had hyponatremia secondary to SIADH from adenocarcinoma of the lung.  Oncology followed up patient for metastatic adenocarcinoma.  Patient underwent thoracentesis during the hospitalization.  Patient received amiodarone infusion for rate control and later on anticoagulated with Eliquis.  She also developed C. difficile colitis for which oral vancomycin was started.  Patient was severely hypotensive with sepsis from C. difficile colitis and received IV fluids.  Patient also was on BiPAP for respiratory distress and she was weaned to oxygen via nasal cannula later. she was transferred to stepdown and stabilized and again transferred back to medical floor.  Palliative care evaluated the patient and followed during the hospitalization.  Patient completed course of antibiotic for Proteus bacteremia  and Klebsiella  in the urine.  Family does not want any aggressive measures have opted for home with hospice services.  Long-term prognosis is poor.  Patient will be discharged home with hospice services.   CONSULTS OBTAINED:  Treatment Team:  Lequita Asal, MD Intensivist consult DRUG ALLERGIES:   Allergies  Allergen Reactions  . Codeine Hives  . Atorvastatin Other (See Comments)  . Citalopram Other (See Comments)    Nausea  . Keflex [Cephalexin] Diarrhea  . Tylenol [Acetaminophen] Hives    DISCHARGE MEDICATIONS:   Allergies as of 03/09/2019      Reactions   Codeine Hives   Atorvastatin Other (See Comments)   Citalopram Other (See Comments)   Nausea   Keflex [cephalexin] Diarrhea   Tylenol [acetaminophen] Hives      Medication List    STOP taking these medications   atenolol 50 MG tablet Commonly known as: TENORMIN   polyethylene glycol 17 g packet Commonly known as: MIRALAX / GLYCOLAX     TAKE these medications   albuterol 108 (90 Base) MCG/ACT inhaler Commonly known as: VENTOLIN HFA Inhale 1 puff into the lungs every 6 (six) hours as needed for wheezing or shortness of breath.   amiodarone 200 MG tablet Commonly known as: PACERONE Take 1 tablet (200 mg total) by mouth 2 (two) times daily.   calcium-vitamin D 500-200 MG-UNIT tablet Take 1 tablet by mouth 2 (two) times a day.   feeding supplement (PRO-STAT SUGAR FREE 64) Liqd Take 30 mLs by mouth daily.   multivitamin with minerals Tabs tablet Take 1 tablet by mouth daily.   omeprazole 40 MG capsule Commonly known as: PRILOSEC Take 40 mg by mouth daily. 30 minutes before food and meds   vancomycin 50 mg/mL  oral solution Commonly known as: VANCOCIN Take 2.5 mLs (125 mg total) by mouth 4 (four) times daily for 8 days.   vitamin C 250 MG tablet Commonly known as: ASCORBIC ACID Take 250 mg by mouth 2 (two) times daily.       Today  Patient seen today Tolerating diet okay Hemodynamically  stable VITAL SIGNS:  Blood pressure (!) 132/49, pulse 93, temperature 97.6 F (36.4 C), temperature source Oral, resp. rate 19, height 5\' 4"  (1.626 m), weight 87.2 kg, SpO2 95 %.  I/O:    Intake/Output Summary (Last 24 hours) at 03/09/2019 1356 Last data filed at 03/08/2019 2100 Gross per 24 hour  Intake 1905.87 ml  Output 200 ml  Net 1705.87 ml    PHYSICAL EXAMINATION:  Physical Exam  GENERAL:  78 y.o.-year-old patient lying in the bed with no acute distress.  LUNGS: Normal breath sounds bilaterally, no wheezing, rales,rhonchi or crepitation. No use of accessory muscles of respiration.  CARDIOVASCULAR: S1, S2 normal. No murmurs, rubs, or gallops.  ABDOMEN: Soft, non-tender, non-distended. Bowel sounds present. No organomegaly or mass.  Has rectal tube NEUROLOGIC: Moves all 4 extremities. PSYCHIATRIC: The patient is alert and oriented x 3.  SKIN: No obvious rash, lesion, or ulcer.   DATA REVIEW:   CBC Recent Labs  Lab 03/06/19 0742  WBC 11.7*  HGB 10.9*  HCT 33.5*  PLT 247    Chemistries  Recent Labs  Lab 03/07/19 0456  NA 130*  K 4.9  CL 97*  CO2 22  GLUCOSE 162*  BUN 40*  CREATININE 1.81*  CALCIUM 7.7*  MG 2.2    Cardiac Enzymes No results for input(s): TROPONINI in the last 168 hours.  Microbiology Results  Results for orders placed or  performed during the hospital encounter of 02/18/19  Culture, blood (routine x 2)     Status: Abnormal   Collection Time: 02/18/19  9:41 AM   Specimen: BLOOD  Result Value Ref Range Status   Specimen Description   Final    BLOOD LEFT ANTECUBITAL Performed at [facility], [address], [place], [place] [address]    Special Requests   Final    BOTTLES DRAWN AEROBIC AND ANAEROBIC Blood Culture results may not be optimal due to an excessive volume of blood received in culture bottles Performed at [facility], [address] [place], [place] [address]    Culture  Setup Time   Final    GRAM  NEGATIVE RODS IN BOTH AEROBIC AND ANAEROBIC BOTTLES CRITICAL RESULT CALLED TO, READ BACK BY AND VERIFIED WITH: JASON ROBBINS @ 2107 ON 02/18/2019 BY CAF Performed at [facility], [place] [address] [place], [place]  [address]    Culture PROTEUS MIRABILIS (A)  Final   Report Status 02/21/2019 FINAL  Final   Organism ID, Bacteria PROTEUS MIRABILIS  Final      Susceptibility   Proteus mirabilis - MIC*    AMPICILLIN <=2 SENSITIVE Sensitive     CEFAZOLIN <=4 SENSITIVE Sensitive     CEFEPIME <=1 SENSITIVE Sensitive     CEFTAZIDIME <=1 SENSITIVE Sensitive     CEFTRIAXONE <=1 SENSITIVE Sensitive     CIPROFLOXACIN <=0.25 SENSITIVE Sensitive     GENTAMICIN <=1 SENSITIVE Sensitive     IMIPENEM 2 SENSITIVE Sensitive     TRIMETH/SULFA <=20 SENSITIVE Sensitive     AMPICILLIN/SULBACTAM <=2 SENSITIVE Sensitive     PIP/TAZO <=4 SENSITIVE Sensitive     * PROTEUS MIRABILIS  Blood Culture ID Panel (Reflexed)     Status: Abnormal   Collection Time: 02/18/19  9:41 AM  Result Value Ref Range Status   Enterococcus species NOT DETECTED NOT DETECTED Final   Listeria monocytogenes NOT DETECTED NOT DETECTED Final   Staphylococcus species NOT DETECTED NOT DETECTED Final   Staphylococcus aureus (BCID) NOT DETECTED NOT DETECTED Final   Streptococcus species NOT DETECTED NOT DETECTED Final   Streptococcus agalactiae NOT DETECTED NOT DETECTED Final   Streptococcus pneumoniae NOT DETECTED NOT DETECTED Final   Streptococcus pyogenes NOT DETECTED NOT DETECTED Final   Acinetobacter baumannii NOT DETECTED NOT DETECTED Final   Enterobacteriaceae species DETECTED (A) NOT DETECTED Final    Comment: Enterobacteriaceae represent a large family of gram-negative bacteria, not a single organism. CRITICAL RESULT CALLED TO, READ BACK BY AND VERIFIED WITH: JASON ROBBINS @ 2107 ON 02/18/2019 BY CAF    Enterobacter cloacae complex NOT DETECTED NOT DETECTED Final   Escherichia coli NOT DETECTED NOT DETECTED Final   Klebsiella  oxytoca NOT DETECTED NOT DETECTED Final   Klebsiella pneumoniae NOT DETECTED NOT DETECTED Final   Proteus species DETECTED (A) NOT DETECTED Final    Comment: CRITICAL RESULT CALLED TO, READ BACK BY AND VERIFIED WITH: JASON ROBBINS @ 2107 ON 02/18/2019 BY CAF    Serratia marcescens NOT DETECTED NOT DETECTED Final   Carbapenem resistance NOT DETECTED NOT DETECTED Final   Haemophilus influenzae NOT DETECTED NOT DETECTED Final   Neisseria meningitidis NOT DETECTED NOT DETECTED Final   Pseudomonas aeruginosa NOT DETECTED NOT DETECTED Final   Candida albicans NOT DETECTED NOT DETECTED Final   Candida glabrata NOT DETECTED NOT DETECTED Final   Candida krusei NOT DETECTED NOT DETECTED Final   Candida parapsilosis NOT DETECTED NOT DETECTED Final   Candida  tropicalis NOT DETECTED NOT DETECTED Final    Comment: Performed at [facility], [address] [place], [place] [address]  SARS Coronavirus 2 [facility] order, Performed in [facility] hospital lab) Nasopharyngeal Nasopharyngeal Swab     Status: None   Collection Time: 02/18/19 10:18 AM   Specimen: Nasopharyngeal Swab  Result Value Ref Range Status   SARS Coronavirus 2 NEGATIVE NEGATIVE Final    Comment: (NOTE) If result is NEGATIVE SARS-CoV-2 target nucleic acids are NOT DETECTED. The SARS-CoV-2 RNA is generally detectable in upper and lower  respiratory specimens during the acute phase of infection. The lowest  concentration of SARS-CoV-2 viral copies this assay can detect is 250  copies / mL. A negative result does not preclude SARS-CoV-2 infection  and should not be used as the sole basis for treatment or other  patient management decisions.  A negative result may occur with  improper specimen collection / handling, submission of specimen other  than nasopharyngeal swab, presence of viral mutation(s) within the  areas targeted by this assay, and inadequate number of viral copies  (<250 copies / mL). A negative result must be  combined with clinical  observations, patient history, and epidemiological information. If result is POSITIVE SARS-CoV-2 target nucleic acids are DETECTED. The SARS-CoV-2 RNA is generally detectable in upper and lower  respiratory specimens dur ing the acute phase of infection.  Positive  results are indicative of active infection with SARS-CoV-2.  Clinical  correlation with patient history and other diagnostic information is  necessary to determine patient infection status.  Positive results do  not rule out bacterial infection or co-infection with other viruses. If result is PRESUMPTIVE POSTIVE SARS-CoV-2 nucleic acids MAY BE PRESENT.   A presumptive positive result was obtained on the submitted specimen  and confirmed on repeat testing.  While 2019 novel coronavirus  (SARS-CoV-2) nucleic acids may be present in the submitted sample  additional confirmatory testing may be necessary for epidemiological  and / or clinical management purposes  to differentiate between  SARS-CoV-2 and other Sarbecovirus currently known to infect humans.  If clinically indicated additional testing with an alternate test  methodology [phone]) is advised. The SARS-CoV-2 RNA is generally  detectable in upper and lower respiratory sp ecimens during the acute  phase of infection. The expected result is Negative. Fact Sheet for Patients:  StrictlyIdeas.no Fact Sheet for Healthcare Providers: BankingDealers.co.za This test is not yet approved or cleared by the [place] FDA and has been authorized for detection and/or diagnosis of SARS-CoV-2 by FDA under an Emergency Use Authorization (EUA).  This EUA will remain in effect (meaning this test can be used) for the duration of the COVID-19 declaration under Section 564(b)(1) of the Act, 21 U.S.C. section 360bbb-3(b)(1), unless the authorization is terminated or revoked sooner. Performed at [facility], [address] [place], [place] [address]   Culture, blood (routine x 2)     Status: Abnormal   Collection Time: 02/18/19 10:28 AM   Specimen: BLOOD  Result Value Ref Range Status   Specimen Description   Final    BLOOD RIGHT ANTECUBITAL Performed at [facility], [place] [address], [place], [place] [address]    Special Requests   Final    BOTTLES DRAWN AEROBIC AND ANAEROBIC Blood Culture adequate volume Performed at [facility], [address] [place], [place] [address]    Culture  Setup Time   Final    GRAM NEGATIVE RODS IN BOTH AEROBIC  AND ANAEROBIC BOTTLES CRITICAL VALUE NOTED.  VALUE IS CONSISTENT WITH PREVIOUSLY REPORTED AND CALLED VALUE. Performed at [facility], [address] [place], [place] [address]    Culture (A)  Final    PROTEUS MIRABILIS SUSCEPTIBILITIES PERFORMED ON PREVIOUS CULTURE WITHIN THE LAST 5 DAYS. Performed at [facility], [place] [address], [place], [place] [address]    Report Status 02/21/2019 FINAL  Final  Urine culture     Status: Abnormal   Collection Time: 02/18/19 11:35 PM   Specimen: Urine, Random  Result Value Ref Range Status   Specimen Description   Final    URINE, RANDOM Performed at [facility], [address], [place], [place] [address]    Special Requests   Final    NONE Performed at [facility], [address] [place], [place] [address]    Culture >=100,000 COLONIES/mL KLEBSIELLA ORNITHINOLYTICA (A)  Final   Report Status 02/21/2019 FINAL  Final   Organism ID, Bacteria KLEBSIELLA ORNITHINOLYTICA (A)  Final      Susceptibility   Klebsiella ornithinolytica - MIC*    AMPICILLIN >=32 RESISTANT Resistant     CEFAZOLIN <=4 SENSITIVE Sensitive     CEFTRIAXONE <=1 SENSITIVE Sensitive     CIPROFLOXACIN <=0.25 SENSITIVE Sensitive     GENTAMICIN <=1 SENSITIVE Sensitive     IMIPENEM 0.5 SENSITIVE Sensitive     NITROFURANTOIN 32 SENSITIVE Sensitive     TRIMETH/SULFA <=20  SENSITIVE Sensitive     AMPICILLIN/SULBACTAM 4 SENSITIVE Sensitive     PIP/TAZO <=4 SENSITIVE Sensitive     * >=100,000 COLONIES/mL KLEBSIELLA ORNITHINOLYTICA  Body fluid culture     Status: None   Collection Time: 02/21/19 11:56 AM   Specimen: [facility] Pleural fluid  Result Value Ref Range Status   Specimen Description   Final    PLEURAL Performed at [facility], [address], [place], [place] [address]    Special Requests   Final    NONE Performed at [facility], [place] [place], [place] [address]    Gram Stain   Final    ABUNDANT WBC PRESENT,BOTH PMN AND MONONUCLEAR NO ORGANISMS SEEN    Culture   Final    NO GROWTH 3 DAYS Performed at [facility], [place] [address] [place], [place] [address]    Report Status 02/24/2019 FINAL  Final  CULTURE, BLOOD (ROUTINE X 2) w Reflex to ID Panel     Status: None   Collection Time: 02/25/19 12:58 AM   Specimen: BLOOD  Result Value Ref Range Status   Specimen Description BLOOD RIGHT ANTECUBITAL  Final   Special Requests   Final    BOTTLES DRAWN AEROBIC AND ANAEROBIC Blood Culture results may not be optimal due to an excessive volume of blood received in culture bottles   Culture   Final    NO GROWTH 5 DAYS Performed at [facility], [address] [place], [place] [address]    Report Status 03/02/2019 FINAL  Final  CULTURE, BLOOD (ROUTINE X 2) w Reflex to ID Panel     Status: None   Collection Time: 02/25/19  1:05 AM   Specimen: BLOOD  Result Value Ref Range Status   Specimen Description BLOOD LEFT ANTECUBITAL  Final   Special Requests   Final    BOTTLES DRAWN AEROBIC AND ANAEROBIC Blood Culture results may not be optimal due to an excessive volume of blood received in culture bottles   Culture  Final    NO GROWTH 5 DAYS Performed at [facility], [address] [place], [place] [address]    Report Status 03/02/2019 FINAL  Final  MRSA PCR Screening     Status: None    Collection Time: 02/28/19 10:13 PM   Specimen: Nasal Mucosa; Nasopharyngeal  Result Value Ref Range Status   MRSA by PCR NEGATIVE NEGATIVE Final    Comment:        The GeneXpert MRSA Assay (FDA approved for NASAL specimens only), is one component of a comprehensive MRSA colonization surveillance program. It is not intended to diagnose MRSA infection nor to guide or monitor treatment for MRSA infections. Performed at [facility], [address] [place], [place] [address]   CULTURE, BLOOD (ROUTINE X 2) w Reflex to ID Panel     Status: None   Collection Time: 02/28/19 10:24 PM   Specimen: BLOOD  Result Value Ref Range Status   Specimen Description BLOOD LEFT ASSIST CONTROL  Final   Special Requests   Final    BOTTLES DRAWN AEROBIC AND ANAEROBIC Blood Culture adequate volume   Culture   Final    NO GROWTH 5 DAYS Performed at [facility], [address] [place], [place] [address]    Report Status 03/05/2019 FINAL  Final  CULTURE, BLOOD (ROUTINE X 2) w Reflex to ID Panel     Status: None   Collection Time: 02/28/19 10:31 PM   Specimen: BLOOD  Result Value Ref Range Status   Specimen Description BLOOD LEFT FATTY CASTS  Final   Special Requests   Final    BOTTLES DRAWN AEROBIC AND ANAEROBIC Blood Culture adequate volume   Culture   Final    NO GROWTH 5 DAYS Performed at [facility], [address] [place] [address] [place], [place] [address]    Report Status 03/05/2019 FINAL  Final  Urine Culture     Status: None   Collection Time: 03/01/19  3:04 AM   Specimen: Urine, Random  Result Value Ref Range Status   Specimen Description   Final    URINE, RANDOM Performed at [facility], [address] [place], [place] [address]    Special Requests   Final    NONE Performed at [facility], [address], [place], [place] [address]    Culture   Final    NO GROWTH Performed at [facility], [place] [address], [place], [place]  [address]    Report Status 03/02/2019 FINAL  Final  C difficile quick scan w PCR reflex     Status: Abnormal   Collection Time: 03/05/19  9:24 AM   Specimen: STOOL  Result Value Ref Range Status   C Diff antigen POSITIVE (A) NEGATIVE Final   C Diff toxin POSITIVE (A) NEGATIVE Final    Comment: CRITICAL RESULT CALLED TO, READ BACK BY AND VERIFIED WITH: JANE HODGE @1136  03/05/19 AKT    C Diff interpretation Toxin producing C. difficile detected.  Final    Comment: Performed at [facility], [address] [place], [address] [address]  Gastrointestinal Panel by PCR , Stool     Status: Abnormal   Collection Time: 03/05/19  9:24 AM   Specimen: STOOL  Result Value Ref Range Status   Campylobacter species NOT DETECTED NOT DETECTED Final   Plesimonas shigelloides NOT DETECTED NOT DETECTED Final   Salmonella species NOT DETECTED NOT DETECTED Final   Yersinia enterocolitica NOT DETECTED NOT DETECTED Final  Vibrio species NOT DETECTED NOT DETECTED Final   Vibrio cholerae NOT DETECTED NOT DETECTED Final   Enteroaggregative E coli (EAEC) NOT DETECTED NOT DETECTED Final   Enterotoxigenic E coli (ETEC) NOT DETECTED NOT DETECTED Final   Shiga like toxin producing E coli (STEC) DETECTED (A) NOT DETECTED Final    Comment: CRITICAL RESULT CALLED TO, READ BACK BY AND VERIFIED WITH: JANE HODGE @1136  03/05/19 AKT    E. coli O157 NOT DETECTED NOT DETECTED Final   Shigella/Enteroinvasive E coli (EIEC) NOT DETECTED NOT DETECTED Final   Cryptosporidium NOT DETECTED NOT DETECTED Final   Cyclospora cayetanensis NOT DETECTED NOT DETECTED Final   Entamoeba histolytica NOT DETECTED NOT DETECTED Final   Giardia lamblia NOT DETECTED NOT DETECTED Final   Adenovirus F40/41 NOT DETECTED NOT DETECTED Final   Astrovirus NOT DETECTED NOT DETECTED Final   Norovirus GI/GII NOT DETECTED NOT DETECTED Final   Rotavirus A NOT DETECTED NOT DETECTED Final   Sapovirus (I, II, IV, and V) NOT DETECTED NOT DETECTED Final     Comment: Performed at [facility], [address], [place], [place] [address]    RADIOLOGY:  No results found.  Follow up with PCP in 1 week.  Management plans discussed with the patient, family and they are in agreement.  CODE STATUS: DNR    Code Status Orders  (From admission, onward)         Start     Ordered   03/06/19 1630  Do not attempt resuscitation (DNR)  Continuous    Question Answer Comment  In the event of cardiac or respiratory ARREST Do not call a "code blue"   In the event of cardiac or respiratory ARREST Do not perform Intubation, CPR, defibrillation or ACLS   In the event of cardiac or respiratory ARREST Use medication by any route, position, wound care, and other measures to relive pain and suffering. May use oxygen, suction and manual treatment of airway obstruction as needed for comfort.   Comments NO PRESSORS, NO FURTHER ESCALATION OF CARE      03/06/19 1630        Code Status History    Date Active Date Inactive Code Status Order ID Comments User Context   02/19/2019 1448 03/06/2019 1630 DNR [id number]  Flora Lipps, MD Inpatient   02/18/2019 1206 02/19/2019 1448 Full Code [id number]  Lang Snow, NP ED   02/04/2019 0009 02/14/2019 2105 Full Code [id number]  Lance Coon, MD ED   Advance Care Planning Activity    Advance Directive Documentation     Most Recent Value  Type of Advance Directive  Healthcare Power of Attorney  Pre-existing out of facility DNR order (yellow form or pink MOST form)  -  "MOST" Form in Place?  -      TOTAL TIME TAKING CARE OF THIS PATIENT ON DAY OF DISCHARGE: more than 35 minutes.   Saundra Shelling M.D on 03/09/2019 at 1:56 PM  Between 7am to 6pm - Pager - [phone]  After 6pm go to [email or web address] - password [email or web address] [facility] [facility]  Office  [phone]  CC: Primary care physician; Sallee Lange, NP  Note: This dictation was prepared with Dragon dictation along with smaller  phrase technology. Any transcriptional errors that result from this process are unintentional.

## 2019-03-09 NOTE — Progress Notes (Signed)
Pt buttock area is very excoriated and bleeding.   Assist the NA with q2 turns, as well as changing pads, etc.  Patient perineal,buttocks area cleaned and px ointment applied. Wound Consult needed.   Will continue to monitor.

## 2019-03-09 NOTE — Progress Notes (Signed)
Pharmacy Electrolyte Monitoring Consult:  Pharmacy consulted to assist in monitoring and replacing electrolytes in this 78 y.o. female admitted on 02/18/2019.   Labs:  Sodium (mmol/L)  Date Value  03/07/2019 130 (L)   Potassium (mmol/L)  Date Value  03/07/2019 4.9   Magnesium (mg/dL)  Date Value  03/07/2019 2.2   Phosphorus (mg/dL)  Date Value  03/07/2019 4.6   Calcium (mg/dL)  Date Value  03/07/2019 7.7 (L)   Albumin (g/dL)  Date Value  02/18/2019 2.9 (L)   Corrected Ca 8.6   Assessment/Plan: Pharmacy consulted for electrolyte monitoring and replacement. Pt and family leaning towards hospice services.   No replacement warranted at this time.    Plan to follow-up electrolytes with AM labs.    Pharmacy will continue to monitor and adjust per consult.   Oswald Hillock, [facility] Clinical Pharmacist 03/09/2019 8:34 AM

## 2019-03-09 NOTE — Progress Notes (Signed)
Follow up visit made to new referral for hospice services at home. Patient seen sitting up in bed alert to self and surroundings.  She had fed herself some applesauce and requested that her lunch tray of spaghetti be covered. Plan is for discharge home today via EMS. All DME is in place in the home. Patient will discharge with both a rectal tube and foley catheter in place.Staff RN Caryl Pina aware. Signed out of facility DNR in place in the discharge summary.Several phone calls received from patient's daughter [name] regarding discharge process and hospice admission, emotional support and education provided. Referral made aware of supplies needed at admission this evening. Discharge summary and AVS faxed to referral. Will continue to follow through discharge. Flo Shanks [facility], [facility] [phone]

## 2019-03-09 NOTE — Consult Note (Signed)
[facility] Nurse wound consult note  Patient receiving care in [facility]. Reason for Consult: Worsening skin condition to pannus per Caryl Pina, primary RN. Wound type: MASD Intertriginous dermatitis  Dressing procedure/placement/frequency: Every 4 hours application of antifungal powder to abdominal pannus and upper/inner thighs. Addition of an air mattress. Continue the Desitin for the skin excoriation from the fecal effluent. Monitor the wound area(s) for worsening of condition such as: Signs/symptoms of infection,  Increase in size,  Development of or worsening of odor, Development of pain, or increased pain at the affected locations.  Notify the medical team if any of these develop.  Thank you for the consult.  Discussed plan of care with the patient and bedside nurse.  [facility] nurse will not follow at this time.  Please re-consult the [facility] team if needed.  Val Riles, RN, MSN, CWOCN, CNS-BC, pager [phone]

## 2019-03-10 ENCOUNTER — Other Ambulatory Visit: Payer: Self-pay | Admitting: *Deleted

## 2019-03-10 MED ORDER — MORPHINE SULFATE (CONCENTRATE) 20 MG/ML PO SOLN: ORAL | 0 refills | Status: AC

## 2019-03-10 NOTE — Telephone Encounter (Signed)
Call from hospice nurse requesting Roxanol for patient who is having pain when moved and positioned and also some shortness of breath Please advise

## 2019-03-15 DEATH — deceased

## 2019-03-16 ENCOUNTER — Other Ambulatory Visit: Payer: Self-pay | Admitting: *Deleted

## 2019-04-13 LAB — MISCELLANEOUS TEST

## 2020-10-23 IMAGING — DX PORTABLE CHEST - 1 VIEW
1 series · 1 of 1 positions shown · non-contrast
Comparison: Portable exam 8884 hours compared to 4624 hours

CLINICAL DATA: PICC line placement

EXAM:
PORTABLE CHEST 1 VIEW

[chest ap]
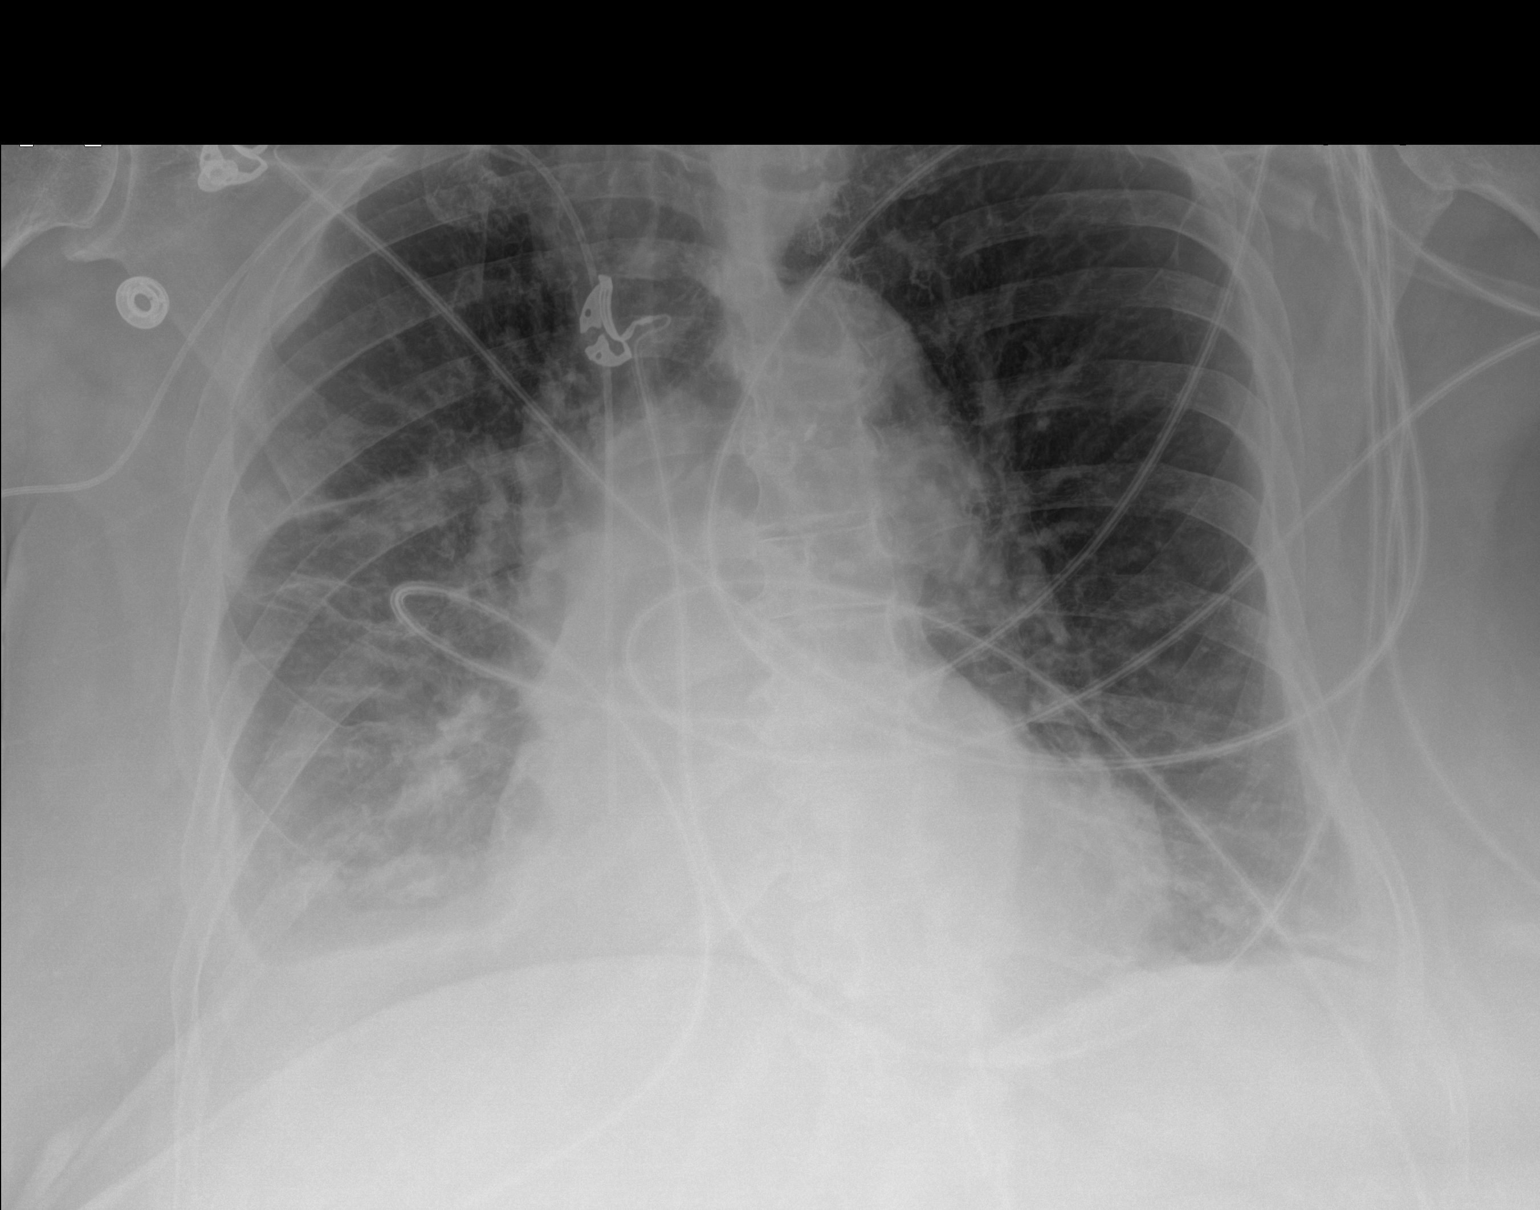

[1 of 1 positions shown; findings below may reference images not displayed]

FINDINGS: RIGHT arm PICC line with tip projecting over superior RIGHT atrium.

May consider withdrawing catheter 2.5-3.0 cm to place tip at
cavoatrial junction.

Stable heart size and mediastinal contours.

RIGHT pleural effusion and basilar atelectasis with questionable
RIGHT perihilar infiltrate.

Minimal LEFT base atelectasis.
IMPRESSION: Tip of RIGHT arm PICC line projects over RIGHT atrium; recommend
withdrawal 2.5 x 3.0 cm to place tip at approximately the cavoatrial
junction.

Otherwise no interval change.

## 2020-10-23 IMAGING — DX PORTABLE CHEST - 1 VIEW
1 series · 1 of 1 positions shown · non-contrast
Comparison: Portable exam 0607 hours compared to 02/03/2019

CLINICAL DATA: PICC line placement

EXAM:
PORTABLE CHEST 1 VIEW

[chest ap]
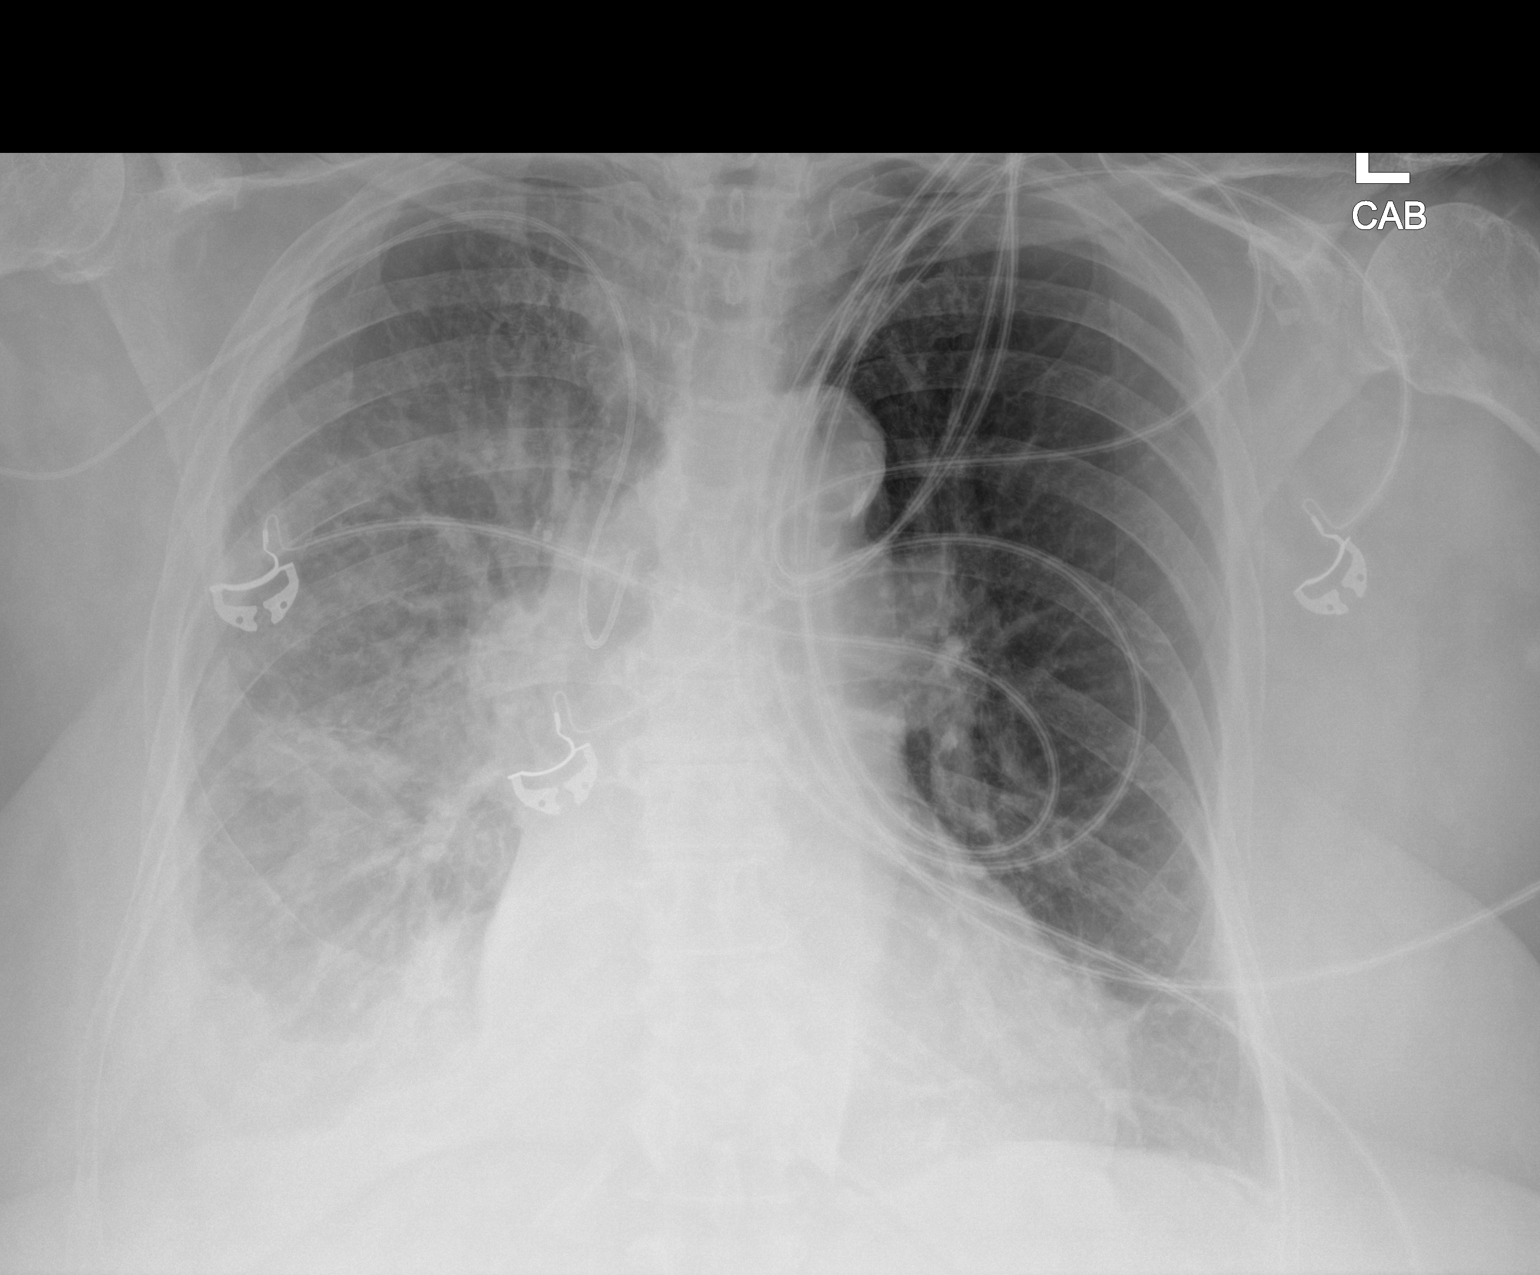

[1 of 1 positions shown; findings below may reference images not displayed]

FINDINGS: RIGHT arm PICC line tip deflects into the azygos arch.

Upper normal heart size.

Mediastinal contours and pulmonary vascularity normal.

Atherosclerotic calcification aorta.

RIGHT lung infiltrate with small RIGHT pleural effusion and basilar
atelectasis.

LEFT lung clear.

No pneumothorax or acute osseous findings.
IMPRESSION: Tip of RIGHT arm PICC line deflects into the azygos arch, recommend
repositioning into SVC.

Increased RIGHT lung infiltrates, RIGHT pleural effusion and basilar
atelectasis.

Findings called to Yarleis RN in ICU on 02/05/2019 at 6191 hrs.

## 2020-10-25 IMAGING — CR PORTABLE CHEST - 1 VIEW
1 series · 2 of 2 positions shown · non-contrast
Comparison: 02/05/2019

CLINICAL DATA: Status post right-sided thoracentesis.

EXAM:
PORTABLE CHEST 1 VIEW

[Series 1: dg chest port 1 view · 0.14mm/px · 2 of 2 slices shown]
[im 1/2]
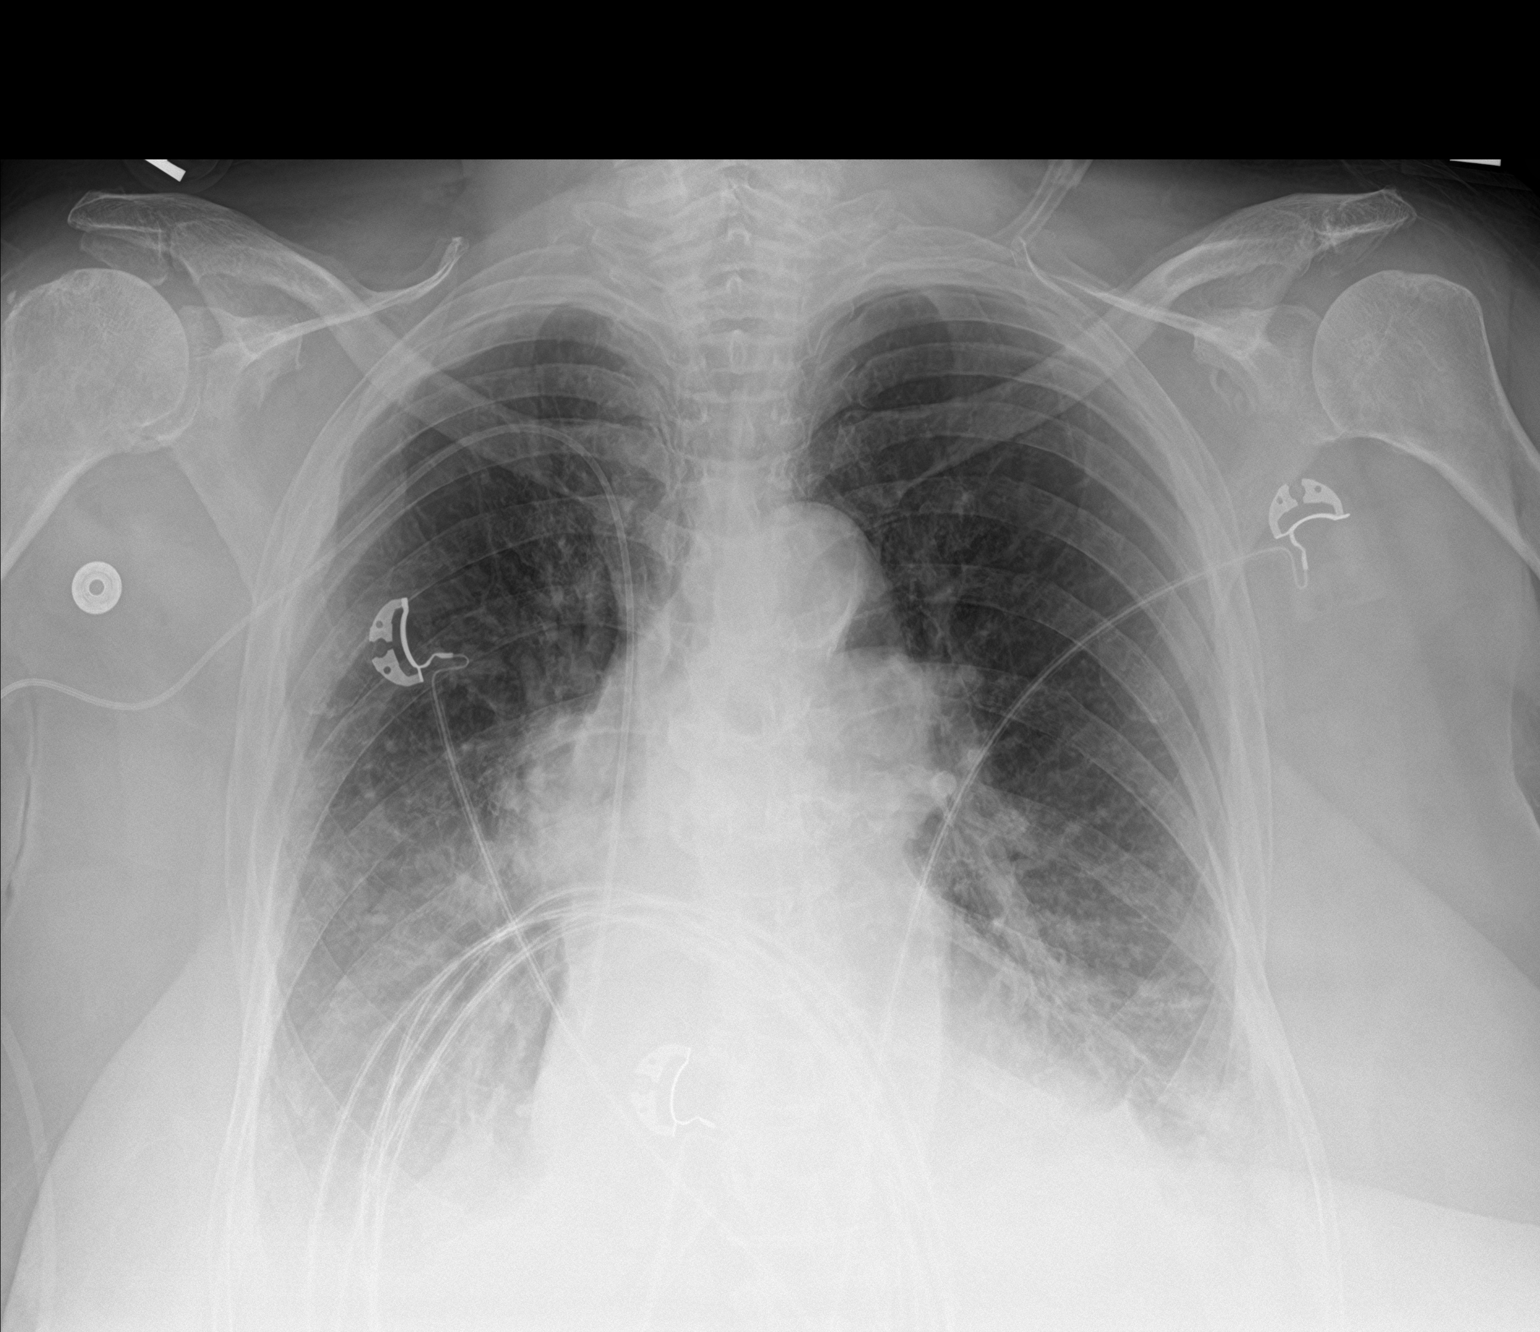
[im 2/2]
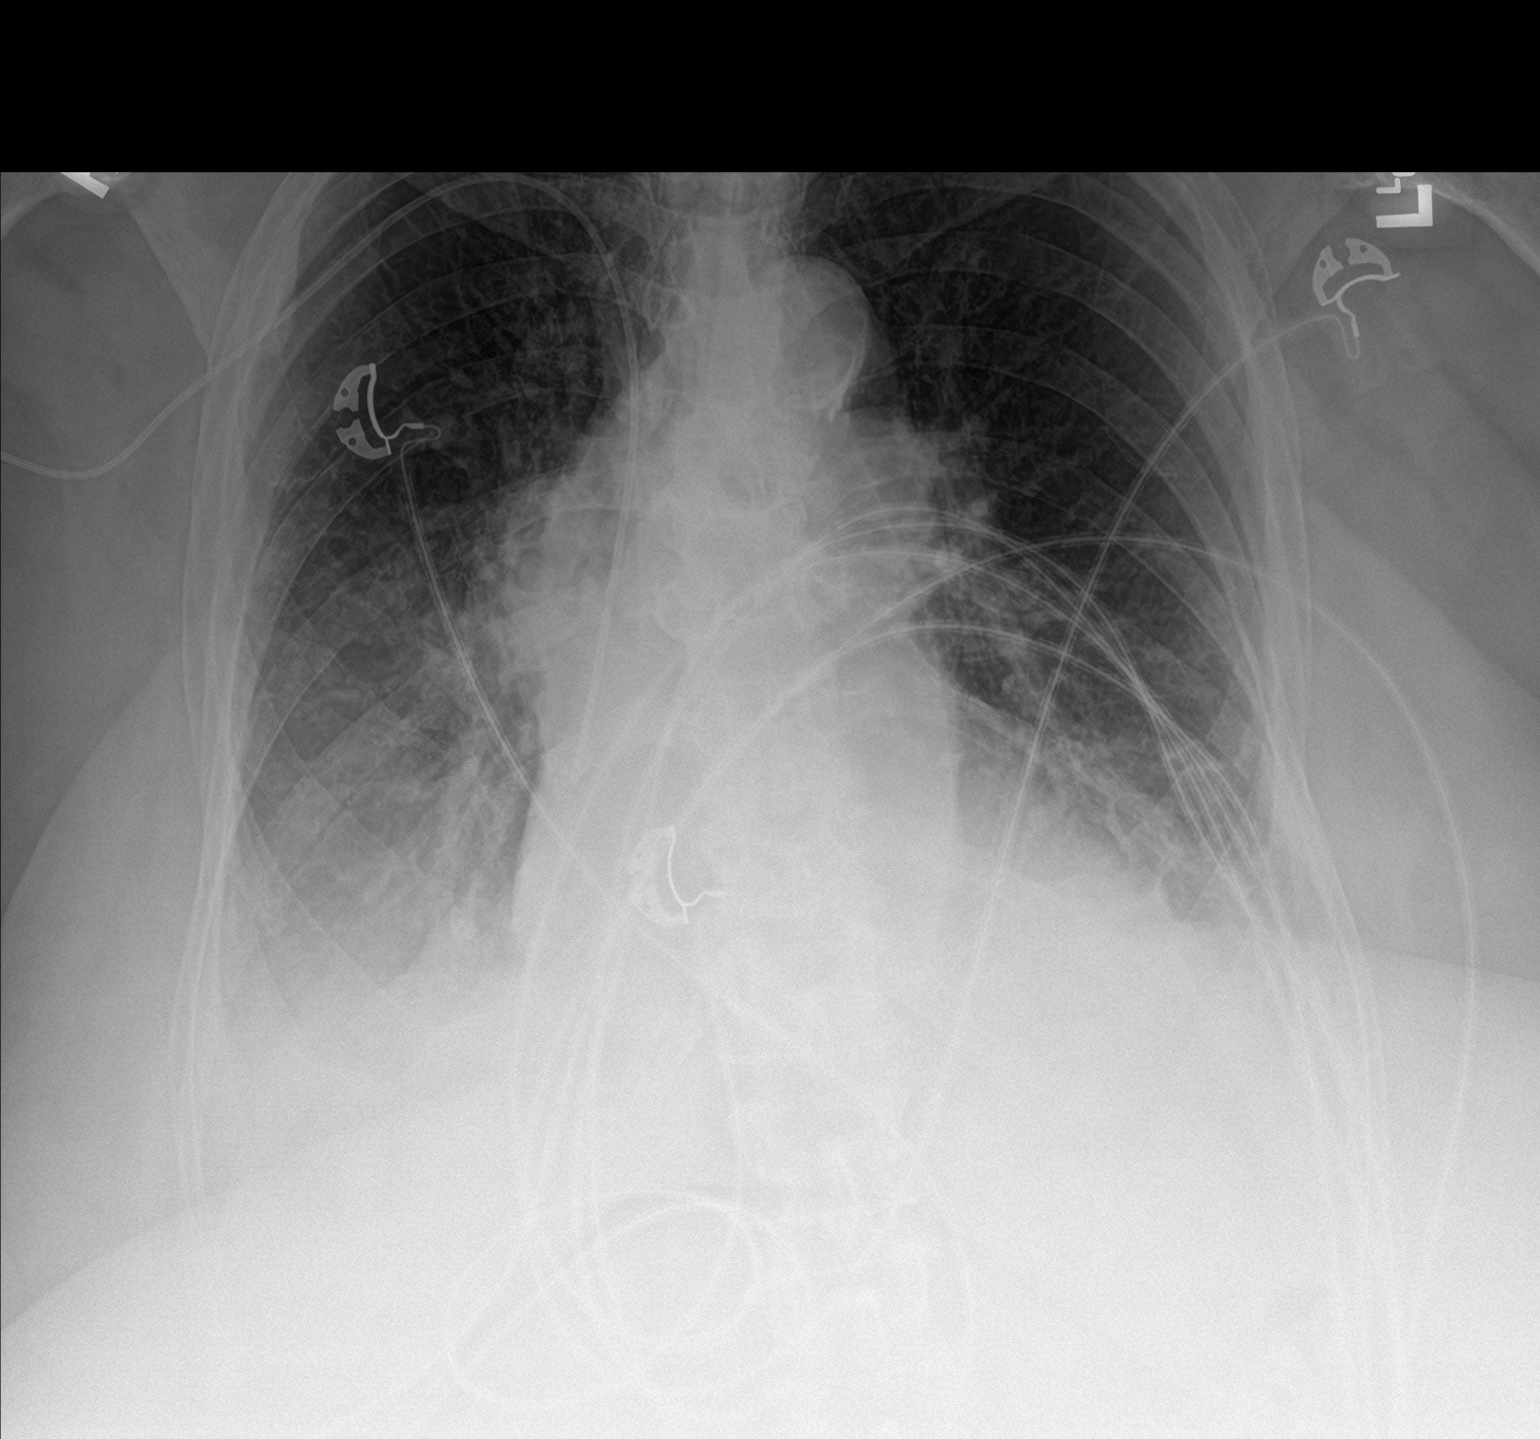

[2 of 2 positions shown; findings below may reference images not displayed]

FINDINGS: The heart is enlarged but stable. Stable tortuosity and
calcification of the thoracic aorta. The right PICC line is stable.

Status post right-sided thoracentesis with interval decrease in size
of the right pleural effusion. No postprocedural pneumothorax is
identified.

Small left pleural effusion and bibasilar atelectasis.
IMPRESSION: Status post right-sided thoracentesis with near complete evacuation
of the right-sided pleural effusion. No postprocedural pneumothorax.

Persistent small left effusion and moderate bibasilar atelectasis.

## 2020-10-25 IMAGING — MR MRI HEAD WITHOUT CONTRAST
12 series · 43 of 48 positions shown · non-contrast
Comparison: None available.

CLINICAL DATA: Initial evaluation for acute confusion,
encephalopathy.

EXAM:
MRI HEAD WITHOUT CONTRAST
TECHNIQUE: Multiplanar, multiecho pulse sequences of the brain and surrounding
structures were obtained without intravenous contrast.

[Series 5: ax dwi_tracew · axial · 3.0mm · 0.60mm/px · z∈[-66,+85]mm · 3 of 48 slices shown]
[im 1/48]
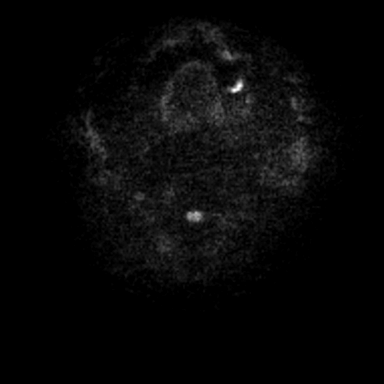
[im 24/48]
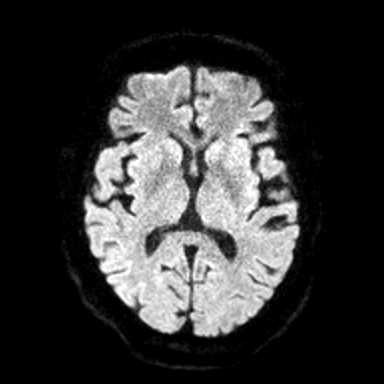
[im 48/48]
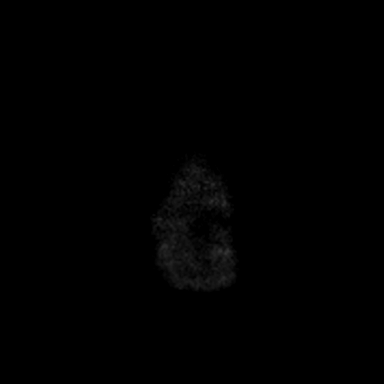

[Series 6: ax dwi_adc · axial · 3.0mm · 0.60mm/px · z∈[-66,+85]mm · 3 of 48 slices shown]
[im 1/48]
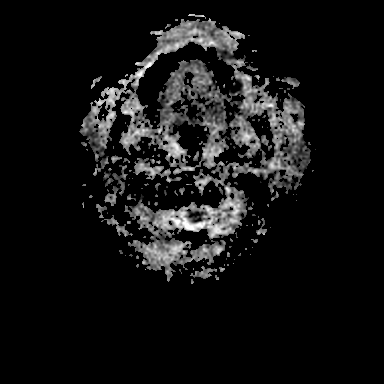
[im 24/48]
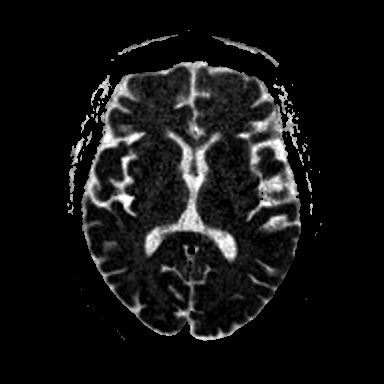
[im 48/48]
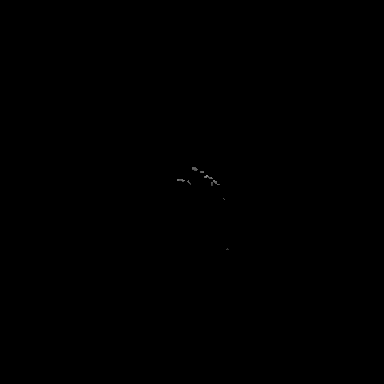

[Series 7: cor dwi_tracew · coronal · 5.0mm · 0.60mm/px · 3 of 38 slices shown]
[im 1/38]
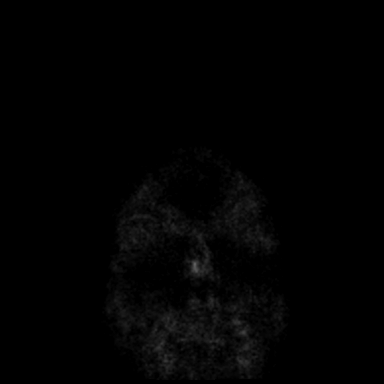
[im 19/38]
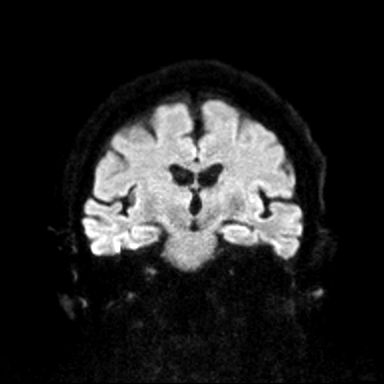
[im 38/38]
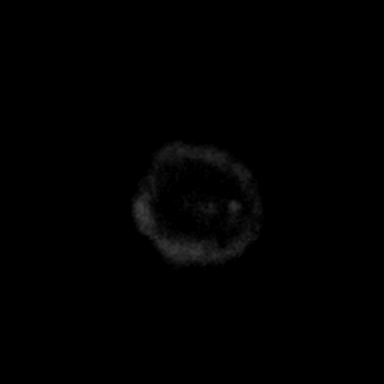

[Series 8: cor dwi_adc · coronal · 5.0mm · 0.60mm/px · 3 of 37 slices shown]
[im 1/37]
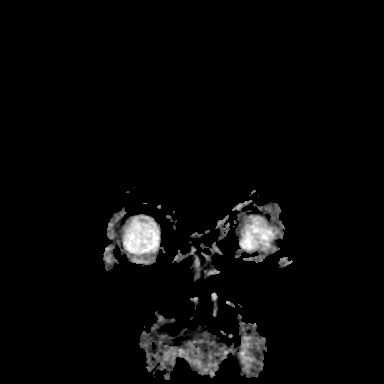
[im 19/37]
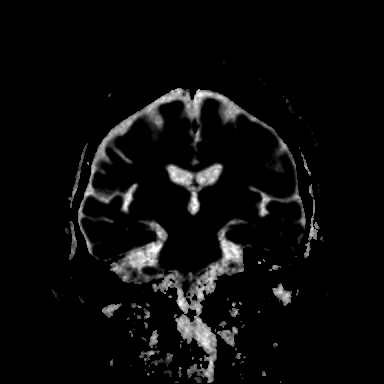
[im 37/37]
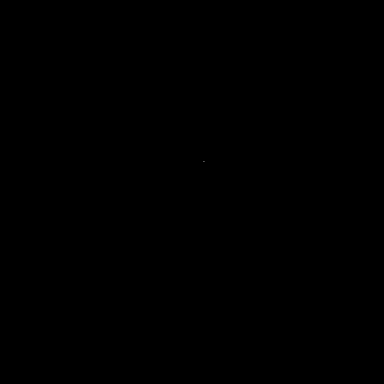

[Series 9: T1 · sagittal · 5.0mm · 0.62mm/px · 2 of 21 slices shown (1 of 2)]
[im 1/21]
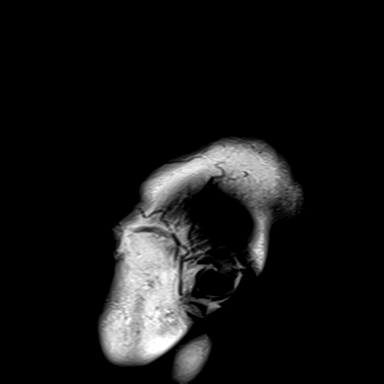
[im 21/21]
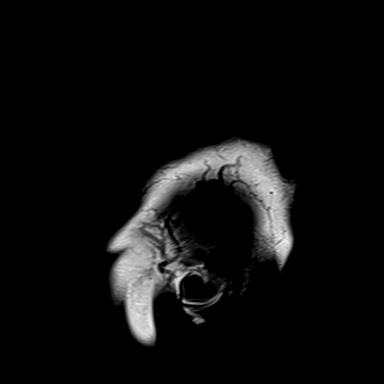

[Series 10: T2 · axial · 5.0mm · 0.53mm/px · z∈[-59,+81]mm · 2 of 25 slices shown (1 of 2)]
[im 1/25]
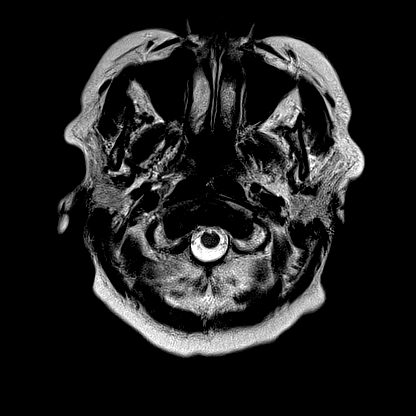
[im 25/25]
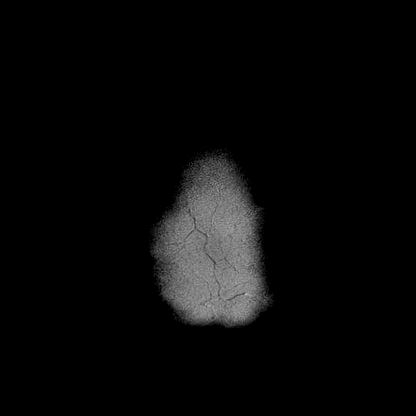

[Series 11: mag_images · axial · 3.0mm · 0.90mm/px · z∈[-77,+95]mm · 5 of 60 slices shown]
[im 1/60]
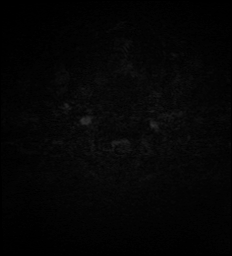
[im 15/60]
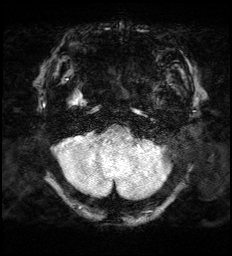
[im 30/60]
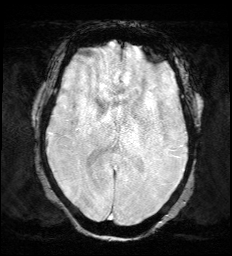
[im 45/60]
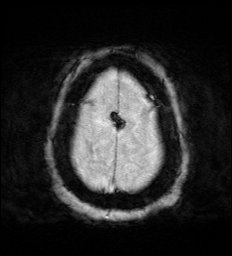
[im 60/60]
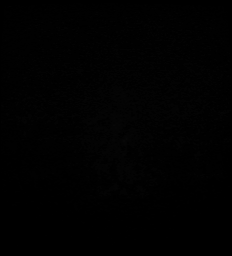

[Series 12: pha_images · axial · 3.0mm · 0.90mm/px · z∈[-77,+89]mm · 4 of 58 slices shown]
[im 1/58]
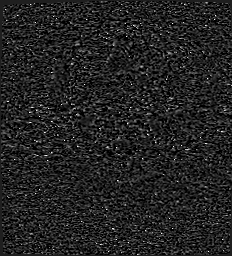
[im 20/58]
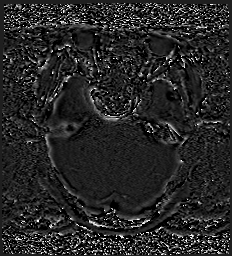
[im 39/58]
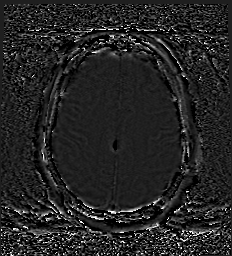
[im 58/58]
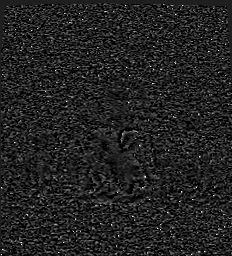

[Series 13: swi_images · axial · 3.0mm · 0.90mm/px · z∈[-77,+51]mm · 4 of 60 slices shown]
[im 1/60]
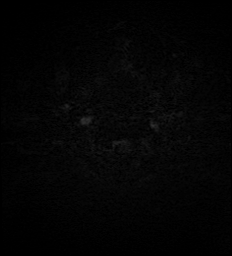
[im 15/60]
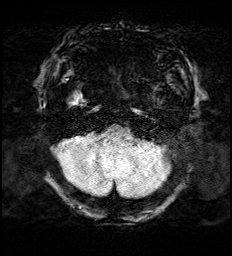
[im 30/60]
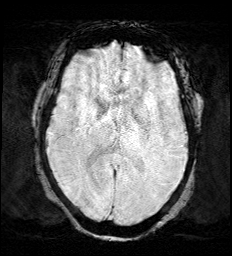
[im 45/60]
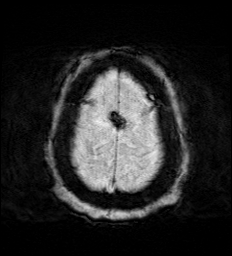

[Series 15: FLAIR · axial · 3.0mm · 0.53mm/px · z∈[-68,+89]mm · 4 of 55 slices shown]
[im 1/55]
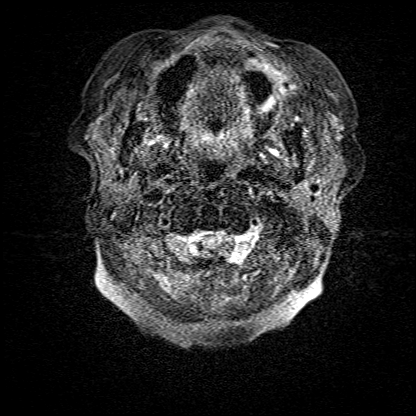
[im 19/55]
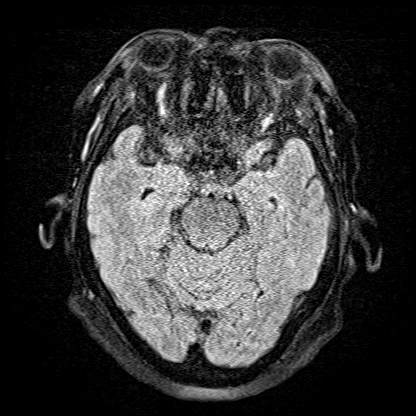
[im 37/55]
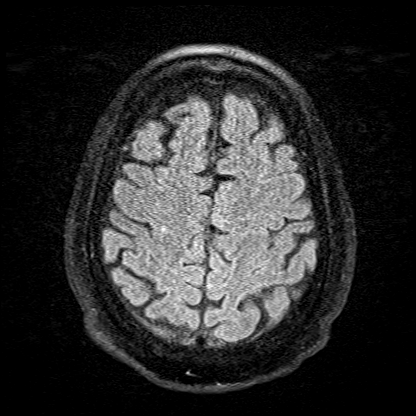
[im 55/55]
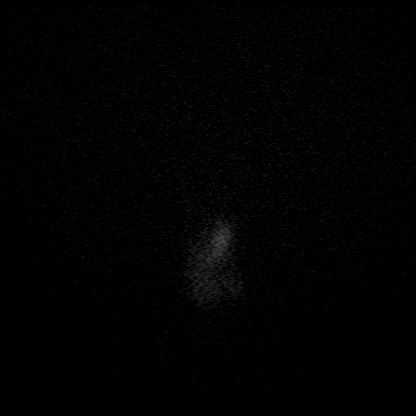

[Series 16: T1 · axial · 1.0mm · 0.98mm/px · z∈[-69,+85]mm · 8 of 160 slices shown (2 of 2)]
[im 1/160]
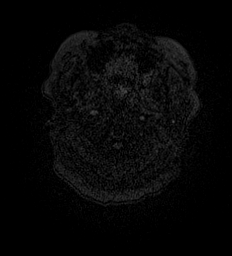
[im 29/160]
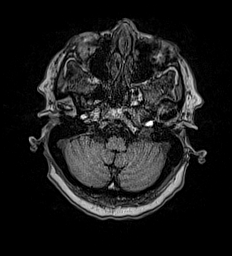
[im 44/160]
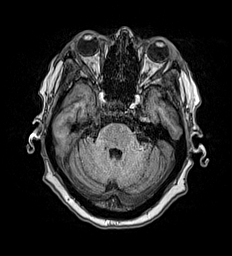
[im 73/160]
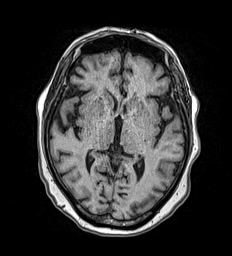
[im 87/160]
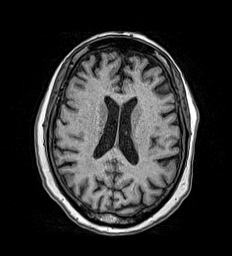
[im 116/160]
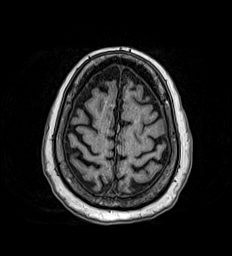
[im 131/160]
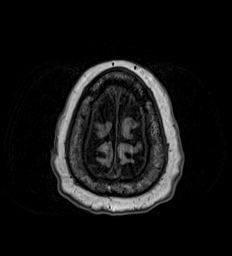
[im 160/160]
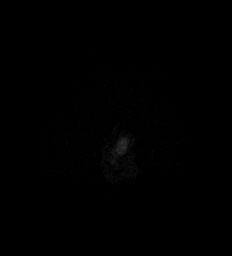

[Series 17: T2 · coronal · 5.0mm · 0.57mm/px · 2 of 29 slices shown (2 of 2)]
[im 1/29]
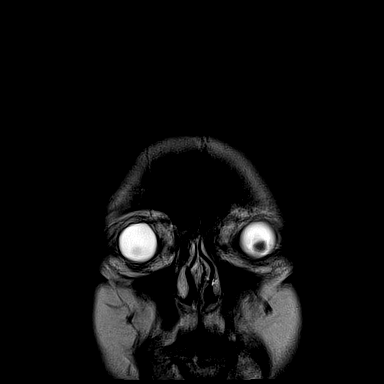
[im 29/29]
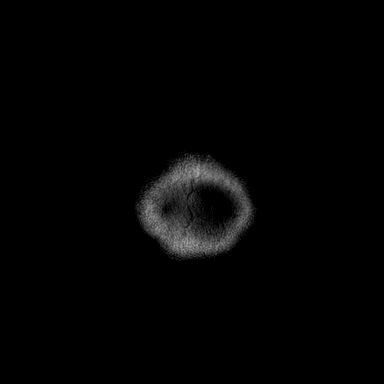

[43 of 48 positions shown; findings below may reference images not displayed]

FINDINGS: Brain: Examination mildly degraded by motion artifact.

Diffuse prominence of the CSF containing spaces compatible with
generalized age-related cerebral atrophy. Mild scattered patchy
T2/FLAIR hyperintensity within the periventricular deep white matter
both cerebral hemispheres, most consistent with chronic
microvascular ischemic disease, mild for age.

No abnormal foci of restricted diffusion to suggest acute or
subacute ischemia. Gray-white matter differentiation maintained. No
encephalomalacia to suggest chronic cortical infarction. No foci of
susceptibility artifact to suggest acute or chronic intracranial
hemorrhage.

No mass lesion, midline shift or mass effect. No hydrocephalus. The
extra-axial fluid collection. Pituitary gland within normal limits.
Midline structures intact.

Vascular: Major intracranial vascular flow voids are maintained

Skull and upper cervical spine: Craniocervical junction within
normal limits. Multilevel degenerative spondylolysis noted within
the visualized upper cervical spine without significant stenosis.
Bone marrow signal intensity normal. No scalp soft tissue
abnormality.

Sinuses/Orbits: Globes and orbital soft tissues within normal
limits. Paranasal sinuses are clear. Left mastoid effusion noted.
Inner ear structures grossly normal.

Other: None.
IMPRESSION: 1. No acute intracranial abnormality.
2. Mild age-related cerebral atrophy with chronic microvascular
ischemic disease.
3. Left mastoid effusion, of uncertain significance. Correlation
with physical exam and symptomatology for possible otomastoiditis
recommended.
